# Patient Record
Sex: Female | Born: 1940
Health system: Southern US, Community
[De-identification: ages and names within clinical notes are randomized; demographics above are authoritative.]

## PROBLEM LIST (undated history)

## (undated) DIAGNOSIS — M810 Age-related osteoporosis without current pathological fracture: Secondary | ICD-10-CM

## (undated) DIAGNOSIS — I10 Essential (primary) hypertension: Secondary | ICD-10-CM

## (undated) DIAGNOSIS — C801 Malignant (primary) neoplasm, unspecified: Secondary | ICD-10-CM

## (undated) DIAGNOSIS — F329 Major depressive disorder, single episode, unspecified: Secondary | ICD-10-CM

## (undated) DIAGNOSIS — M1990 Unspecified osteoarthritis, unspecified site: Secondary | ICD-10-CM

## (undated) DIAGNOSIS — Z8719 Personal history of other diseases of the digestive system: Secondary | ICD-10-CM

## (undated) DIAGNOSIS — D649 Anemia, unspecified: Secondary | ICD-10-CM

## (undated) DIAGNOSIS — K219 Gastro-esophageal reflux disease without esophagitis: Secondary | ICD-10-CM

## (undated) DIAGNOSIS — R091 Pleurisy: Secondary | ICD-10-CM

## (undated) DIAGNOSIS — Z87440 Personal history of urinary (tract) infections: Secondary | ICD-10-CM

## (undated) DIAGNOSIS — T7840XA Allergy, unspecified, initial encounter: Secondary | ICD-10-CM

## (undated) DIAGNOSIS — F419 Anxiety disorder, unspecified: Secondary | ICD-10-CM

## (undated) DIAGNOSIS — I639 Cerebral infarction, unspecified: Secondary | ICD-10-CM

## (undated) DIAGNOSIS — E7800 Pure hypercholesterolemia, unspecified: Secondary | ICD-10-CM

## (undated) DIAGNOSIS — R51 Headache: Secondary | ICD-10-CM

## (undated) DIAGNOSIS — K5900 Constipation, unspecified: Secondary | ICD-10-CM

## (undated) DIAGNOSIS — F32A Depression, unspecified: Secondary | ICD-10-CM

## (undated) HISTORY — DX: Cerebral infarction, unspecified: I63.9

## (undated) HISTORY — DX: Pure hypercholesterolemia, unspecified: E78.00

## (undated) HISTORY — DX: Anxiety disorder, unspecified: F41.9

## (undated) HISTORY — PX: BACK SURGERY: SHX140

## (undated) HISTORY — PX: EYE SURGERY: SHX253

## (undated) HISTORY — PX: COLONOSCOPY: SHX174

## (undated) HISTORY — PX: OTHER SURGICAL HISTORY: SHX169

## (undated) HISTORY — PX: APPENDECTOMY: SHX54

## (undated) HISTORY — PX: DILATION AND CURETTAGE OF UTERUS: SHX78

## (undated) HISTORY — PX: ABDOMINAL HYSTERECTOMY: SHX81

## (undated) HISTORY — PX: CHOLECYSTECTOMY: SHX55

## (undated) HISTORY — PX: FOOT SURGERY: SHX648

## (undated) HISTORY — DX: Allergy, unspecified, initial encounter: T78.40XA

---

## 2005-07-04 ENCOUNTER — Ambulatory Visit: Payer: Self-pay | Admitting: Ophthalmology

## 2008-03-23 ENCOUNTER — Ambulatory Visit: Payer: Self-pay | Admitting: Gastroenterology

## 2009-11-21 ENCOUNTER — Ambulatory Visit: Payer: Self-pay

## 2009-11-29 ENCOUNTER — Ambulatory Visit: Payer: Self-pay | Admitting: Family Medicine

## 2009-12-08 ENCOUNTER — Ambulatory Visit: Payer: Self-pay | Admitting: Unknown Physician Specialty

## 2009-12-09 ENCOUNTER — Ambulatory Visit: Payer: Self-pay | Admitting: Unknown Physician Specialty

## 2010-01-25 ENCOUNTER — Ambulatory Visit: Payer: Self-pay | Admitting: Unknown Physician Specialty

## 2010-02-09 ENCOUNTER — Ambulatory Visit: Payer: Self-pay | Admitting: Unknown Physician Specialty

## 2010-07-13 ENCOUNTER — Ambulatory Visit: Payer: Self-pay | Admitting: Anesthesiology

## 2010-08-02 ENCOUNTER — Ambulatory Visit: Payer: Self-pay | Admitting: Anesthesiology

## 2010-08-29 ENCOUNTER — Ambulatory Visit: Payer: Self-pay | Admitting: Anesthesiology

## 2010-09-28 ENCOUNTER — Ambulatory Visit: Payer: Self-pay | Admitting: Anesthesiology

## 2010-10-20 ENCOUNTER — Ambulatory Visit: Payer: Self-pay | Admitting: Anesthesiology

## 2010-11-13 ENCOUNTER — Ambulatory Visit: Payer: Self-pay | Admitting: Neurosurgery

## 2011-03-02 ENCOUNTER — Ambulatory Visit: Payer: Self-pay | Admitting: Internal Medicine

## 2011-03-08 ENCOUNTER — Ambulatory Visit: Payer: Self-pay | Admitting: Internal Medicine

## 2011-05-22 ENCOUNTER — Other Ambulatory Visit: Payer: Self-pay | Admitting: Gastroenterology

## 2011-05-24 ENCOUNTER — Ambulatory Visit: Payer: Self-pay | Admitting: Gastroenterology

## 2011-06-13 ENCOUNTER — Ambulatory Visit: Payer: Self-pay | Admitting: Obstetrics and Gynecology

## 2011-06-21 ENCOUNTER — Ambulatory Visit: Payer: Self-pay | Admitting: Gastroenterology

## 2011-07-10 ENCOUNTER — Ambulatory Visit: Payer: Self-pay | Admitting: Obstetrics and Gynecology

## 2011-07-10 LAB — BASIC METABOLIC PANEL
BUN: 5 mg/dL — ABNORMAL LOW (ref 7–18)
Calcium, Total: 9.2 mg/dL (ref 8.5–10.1)
Chloride: 101 mmol/L (ref 98–107)
Co2: 27 mmol/L (ref 21–32)
Creatinine: 0.56 mg/dL — ABNORMAL LOW (ref 0.60–1.30)
EGFR (African American): >60
EGFR (Non-African Amer.): >60
Osmolality: 273 (ref 275–301)
Potassium: 4.6 mmol/L (ref 3.5–5.1)
Sodium: 138 mmol/L (ref 136–145)

## 2011-07-10 LAB — CBC
HCT: 38.1 % (ref 35.0–47.0)
HGB: 12.7 g/dL (ref 12.0–16.0)
MCH: 33 pg (ref 26.0–34.0)
MCHC: 33.2 g/dL (ref 32.0–36.0)
RBC: 3.83 10*6/uL (ref 3.80–5.20)

## 2011-07-16 ENCOUNTER — Ambulatory Visit: Payer: Self-pay | Admitting: Obstetrics and Gynecology

## 2011-07-18 LAB — PATHOLOGY REPORT

## 2012-03-05 ENCOUNTER — Ambulatory Visit: Payer: Self-pay | Admitting: Internal Medicine

## 2012-03-05 LAB — HM MAMMOGRAPHY: HM MAMMO: NORMAL

## 2013-01-31 ENCOUNTER — Inpatient Hospital Stay: Payer: Self-pay | Admitting: Internal Medicine

## 2013-01-31 LAB — COMPREHENSIVE METABOLIC PANEL
Albumin: 2.4 g/dL — ABNORMAL LOW (ref 3.4–5.0)
Alkaline Phosphatase: 174 U/L — ABNORMAL HIGH (ref 50–136)
Anion Gap: 13 (ref 7–16)
BUN: 8 mg/dL (ref 7–18)
Bilirubin,Total: 0.9 mg/dL (ref 0.2–1.0)
Calcium, Total: 8.8 mg/dL (ref 8.5–10.1)
Chloride: 91 mmol/L — ABNORMAL LOW (ref 98–107)
Co2: 23 mmol/L (ref 21–32)
EGFR (African American): >60
EGFR (Non-African Amer.): >60
Osmolality: 254 (ref 275–301)
Potassium: 3.9 mmol/L (ref 3.5–5.1)
SGOT(AST): 60 U/L — ABNORMAL HIGH (ref 15–37)
SGPT (ALT): 29 U/L (ref 12–78)
Sodium: 127 mmol/L — ABNORMAL LOW (ref 136–145)
Total Protein: 6 g/dL — ABNORMAL LOW (ref 6.4–8.2)

## 2013-01-31 LAB — URINALYSIS, COMPLETE
Bacteria: NONE SEEN
Bilirubin,UR: NEGATIVE
Blood: NEGATIVE
Glucose,UR: NEGATIVE mg/dL (ref 0–75)
Leukocyte Esterase: NEGATIVE
Nitrite: NEGATIVE
Ph: 6 (ref 4.5–8.0)
Squamous Epithelial: <1
WBC UR: 1 /HPF (ref 0–5)

## 2013-01-31 LAB — CBC
HGB: 14.1 g/dL (ref 12.0–16.0)
RBC: 3.75 10*6/uL — ABNORMAL LOW (ref 3.80–5.20)
RDW: 12.2 % (ref 11.5–14.5)
WBC: 8.6 10*3/uL (ref 3.6–11.0)

## 2013-01-31 LAB — CK TOTAL AND CKMB (NOT AT ARMC)
CK, Total: 159 U/L (ref 21–215)
CK-MB: 4.3 ng/mL — ABNORMAL HIGH (ref 0.5–3.6)

## 2013-02-01 ENCOUNTER — Ambulatory Visit: Payer: Self-pay | Admitting: Neurology

## 2013-02-01 DIAGNOSIS — I359 Nonrheumatic aortic valve disorder, unspecified: Secondary | ICD-10-CM

## 2013-02-01 LAB — COMPREHENSIVE METABOLIC PANEL
Albumin: 1.9 g/dL — ABNORMAL LOW (ref 3.4–5.0)
Alkaline Phosphatase: 141 U/L — ABNORMAL HIGH (ref 50–136)
Bilirubin,Total: 0.5 mg/dL (ref 0.2–1.0)
Calcium, Total: 7.9 mg/dL — ABNORMAL LOW (ref 8.5–10.1)
Chloride: 96 mmol/L — ABNORMAL LOW (ref 98–107)
Co2: 25 mmol/L (ref 21–32)
Creatinine: 0.61 mg/dL (ref 0.60–1.30)
EGFR (African American): >60
EGFR (Non-African Amer.): >60
Glucose: 87 mg/dL (ref 65–99)
Osmolality: 258 (ref 275–301)
SGOT(AST): 38 U/L — ABNORMAL HIGH (ref 15–37)
SGPT (ALT): 21 U/L (ref 12–78)
Total Protein: 4.8 g/dL — ABNORMAL LOW (ref 6.4–8.2)

## 2013-02-01 LAB — LIPID PANEL
Ldl Cholesterol, Calc: 87 mg/dL (ref 0–100)
Triglycerides: 70 mg/dL (ref 0–200)
VLDL Cholesterol, Calc: 14 mg/dL (ref 5–40)

## 2013-02-02 LAB — BASIC METABOLIC PANEL
Anion Gap: 7 (ref 7–16)
BUN: 3 mg/dL — ABNORMAL LOW (ref 7–18)
Calcium, Total: 7.8 mg/dL — ABNORMAL LOW (ref 8.5–10.1)
Chloride: 101 mmol/L (ref 98–107)
Co2: 25 mmol/L (ref 21–32)
EGFR (African American): >60
Osmolality: 263 (ref 275–301)

## 2013-02-03 ENCOUNTER — Encounter: Payer: Self-pay | Admitting: Internal Medicine

## 2013-02-03 LAB — URINALYSIS, COMPLETE
Bilirubin,UR: NEGATIVE
Glucose,UR: NEGATIVE mg/dL (ref 0–75)
Nitrite: POSITIVE
Protein: 100
Specific Gravity: 1.019 (ref 1.003–1.030)
Squamous Epithelial: 8
Transitional Epi: 1
WBC UR: 520 /HPF (ref 0–5)

## 2013-02-03 LAB — MAGNESIUM: Magnesium: 1.6 mg/dL — ABNORMAL LOW

## 2013-02-03 LAB — SODIUM: Sodium: 133 mmol/L — ABNORMAL LOW (ref 136–145)

## 2013-02-12 LAB — BASIC METABOLIC PANEL WITH GFR
Anion Gap: 7
BUN: 10 mg/dL
Calcium, Total: 8.9 mg/dL
Chloride: 108 mmol/L — ABNORMAL HIGH
Co2: 24 mmol/L
Creatinine: 0.37 mg/dL — ABNORMAL LOW
EGFR (African American): >60
EGFR (Non-African Amer.): >60
Glucose: 89 mg/dL
Osmolality: 276
Potassium: 3.8 mmol/L
Sodium: 139 mmol/L

## 2013-02-20 LAB — URINALYSIS, COMPLETE
Bilirubin,UR: NEGATIVE
Blood: NEGATIVE
Ketone: NEGATIVE
Leukocyte Esterase: NEGATIVE
Nitrite: NEGATIVE
Protein: NEGATIVE
WBC UR: 1 /HPF (ref 0–5)

## 2013-02-21 LAB — CLOSTRIDIUM DIFFICILE BY PCR

## 2013-02-22 LAB — URINE CULTURE

## 2013-02-23 LAB — BASIC METABOLIC PANEL
Anion Gap: 7 (ref 7–16)
BUN: 9 mg/dL (ref 7–18)
Calcium, Total: 8.9 mg/dL (ref 8.5–10.1)
Chloride: 112 mmol/L — ABNORMAL HIGH (ref 98–107)
Co2: 25 mmol/L (ref 21–32)
EGFR (African American): >60
Glucose: 90 mg/dL (ref 65–99)
Osmolality: 285 (ref 275–301)

## 2013-06-26 ENCOUNTER — Ambulatory Visit: Payer: Self-pay | Admitting: Family Medicine

## 2013-06-26 ENCOUNTER — Telehealth: Payer: Self-pay | Admitting: *Deleted

## 2013-06-26 NOTE — Telephone Encounter (Signed)
DR LAMB CALLED FROM [facility] REGARDING PT. SAID SHE HAS CELLULITIS/OSTEOMYLITIS AND SHE HAS A SCREW IN HER GREAT RIGHT TOE. WANTED TO SPEAK TO THE DOCTOR ON CALL BECAUSE SHE DID NOT KNOW WHAT TO DO WITH PT. I ADVISED HER TO SEND THE PT TO ER.  SAID SHE DID CALL THE ORTHOPEDICS ON CALL AND THEY SAID THEY DO NOT DEAL WITH FEET AND TO CALL HER PODIATRIST. SHE IS STILL WANTING TO TALK TO THE DOCTOR ON CALL. CAN YOU PLEASE CALL HER? [phone]. I AM FAXING CHART NOTES ON PT.

## 2013-06-29 ENCOUNTER — Encounter: Payer: Self-pay | Admitting: Podiatry

## 2013-06-29 ENCOUNTER — Ambulatory Visit (INDEPENDENT_AMBULATORY_CARE_PROVIDER_SITE_OTHER): Payer: Medicare HMO | Admitting: Podiatry

## 2013-06-29 ENCOUNTER — Ambulatory Visit (INDEPENDENT_AMBULATORY_CARE_PROVIDER_SITE_OTHER): Payer: Medicare HMO

## 2013-06-29 VITALS — BP 118/71 | HR 76 | Resp 16 | Ht 63.0 in | Wt 138.0 lb

## 2013-06-29 DIAGNOSIS — M79609 Pain in unspecified limb: Secondary | ICD-10-CM

## 2013-06-29 DIAGNOSIS — M79676 Pain in unspecified toe(s): Secondary | ICD-10-CM

## 2013-06-29 DIAGNOSIS — L03039 Cellulitis of unspecified toe: Secondary | ICD-10-CM

## 2013-06-29 DIAGNOSIS — L02619 Cutaneous abscess of unspecified foot: Secondary | ICD-10-CM

## 2013-06-29 MED ORDER — MUPIROCIN 2 % EX OINT: TOPICAL_OINTMENT | CUTANEOUS | Status: DC

## 2013-06-29 NOTE — Progress Notes (Signed)
Dr Elsworth Soho states pt has cellulitis of Right 1st toe, screw placement by Dr Milinda Pointer.  [place] pt no records in our office or in [place]. I spoke with Shelly Rubenstein and advised their office to send records to Dr. Elsworth Soho, and Dr. Blenda Mounts advised sent pt to the hospital for IV antibiotics.  Records faxed to our office for Dr. Blenda Mounts to advise Dr. Elsworth Soho.  Dr. Blenda Mounts spoke with Dr. Elsworth Soho.  Pt had already been scheduled to see Dr. Milinda Pointer in [place] on 06/29/2013.

## 2013-06-29 NOTE — Progress Notes (Signed)
   Subjective:    Patient ID: Valerie Hanna, female    DOB: 10/16/1940, 73 y.o.   MRN: [id number]  HPI Comments: Right great toe red swollen infected, it started 3 weeks ago. Went to urgent care on Friday she lanced it on Friday ,but stuff still coming out of it, put on antibiotics. Soaking in epsom salt soaks   Toe Pain       Review of Systems  HENT:       Sinus problems   Gastrointestinal: Positive for diarrhea.  Neurological: Positive for headaches.  Psychiatric/Behavioral: The patient is nervous/anxious.        Objective:   Physical Exam I have reviewed her past medical history medications allergies and surgeries. Most recently she has stroke August of 2014. Left-sided weakness. Pulses are strongly palpable right lower extremity. The right hallux demonstrates moderate to severe edema with cellulitis superficial ulceration to the dorsal aspect demonstrates only a small abscess to the dorsal lateral aspect of the toe. Currently it appears the cellulitis is subsiding. She's currently taking Bactrim and Levaquin. Radiographic evaluation does demonstrate retention of a single screw to the hallux right with a very nice IPJ fusion. I see no signs of osseous breakdown is no signs of osteomyelitis.        Assessment & Plan:  Assessment cellulitis with abscess hallux right.  Plan: Epsom salts and water soaks twice daily loose dressing and the use of her Darco shoes she has at home. She will continue her antibiotics I will followup with her in one to 2 weeks. Watch for worsening signs of infection i.e. Fever chills nausea vomiting streaking redness of the middle of the foot. If this does not resolve in the next couple of weeks we will need to take her to surgery.

## 2013-06-30 LAB — WOUND CULTURE

## 2013-07-06 ENCOUNTER — Ambulatory Visit: Payer: Self-pay | Admitting: Podiatry

## 2013-07-06 ENCOUNTER — Ambulatory Visit: Payer: Medicare HMO | Admitting: Podiatry

## 2013-07-20 ENCOUNTER — Ambulatory Visit (INDEPENDENT_AMBULATORY_CARE_PROVIDER_SITE_OTHER): Payer: Medicare HMO | Admitting: Podiatry

## 2013-07-20 ENCOUNTER — Encounter: Payer: Self-pay | Admitting: Podiatry

## 2013-07-20 VITALS — BP 140/83 | HR 93 | Resp 16 | Ht 63.0 in | Wt 138.0 lb

## 2013-07-20 DIAGNOSIS — M79609 Pain in unspecified limb: Secondary | ICD-10-CM

## 2013-07-20 DIAGNOSIS — L03039 Cellulitis of unspecified toe: Principal | ICD-10-CM

## 2013-07-20 DIAGNOSIS — B351 Tinea unguium: Secondary | ICD-10-CM

## 2013-07-20 DIAGNOSIS — L02619 Cutaneous abscess of unspecified foot: Secondary | ICD-10-CM

## 2013-07-20 NOTE — Progress Notes (Signed)
She presents today for followup of her abscess hallux right. She states it seems to be doing better but I have numbness and tingling in my toes. She states that it does not keep her awake at night and that the numbness and tingling is minimal. She continues to soak her foot daily that she has not soaked it over the past day or so. She's also complaining about elongated toenails.  Objective: Vital signs are stable she is alert and oriented x3. Her nails are thick yellow dystrophic lytic mycotic and severely elongated. Her abscess to the hallux right appears to be healing quite nicely there is minimal erythema no edema saline is drainage or odor associated with this.  Assessment: Well-healing abscess hallux right. Pain in limb secondary to onychomycosis 1 through 5 bilateral.  Plan: Debridement of nails 1 through 5 bilateral. Continue soaking the abscess followup with her in one month

## 2013-08-10 ENCOUNTER — Ambulatory Visit: Payer: Medicare HMO | Admitting: Podiatry

## 2013-08-12 ENCOUNTER — Ambulatory Visit (INDEPENDENT_AMBULATORY_CARE_PROVIDER_SITE_OTHER): Payer: Medicare HMO

## 2013-08-12 ENCOUNTER — Ambulatory Visit (INDEPENDENT_AMBULATORY_CARE_PROVIDER_SITE_OTHER): Payer: Medicare HMO | Admitting: Podiatry

## 2013-08-12 VITALS — BP 136/73 | HR 98 | Resp 16 | Ht 63.0 in | Wt 138.0 lb

## 2013-08-12 DIAGNOSIS — T8489XA Other specified complication of internal orthopedic prosthetic devices, implants and grafts, initial encounter: Secondary | ICD-10-CM

## 2013-08-12 DIAGNOSIS — M79609 Pain in unspecified limb: Secondary | ICD-10-CM

## 2013-08-12 DIAGNOSIS — M79673 Pain in unspecified foot: Secondary | ICD-10-CM

## 2013-08-12 DIAGNOSIS — T8484XA Pain due to internal orthopedic prosthetic devices, implants and grafts, initial encounter: Secondary | ICD-10-CM

## 2013-08-12 MED ORDER — PROMETHAZINE HCL 12.5 MG PO TABS: ORAL_TABLET | ORAL | Status: DC

## 2013-08-12 MED ORDER — OXYCODONE-ACETAMINOPHEN 10-325 MG PO TABS: ORAL_TABLET | ORAL | Status: DC

## 2013-08-12 MED ORDER — CEPHALEXIN 500 MG PO CAPS: 1000.0000 mg | ORAL_CAPSULE | Freq: Three times a day (TID) | ORAL | Status: DC

## 2013-08-12 NOTE — Progress Notes (Signed)
She presents today complaining of a painful hallux right foot as well as the left foot. She has taken her antibiotics and the hallux is still quite painful.  Objective: Vital signs are stable she is alert and oriented x3. Pulses are strongly palpable bilateral. Pain on range of motion of the IP joint of the hallux left indicative of osteoarthritis confirmed by radiographs. Painful internal fixation with superficial ulceration and cellulitis secondary to a screw that has ulcerated to the distal aspect of the skin of the toe. Superficial cellulitis extending from the proximal nail border overlying the IPJ.  Assessment: Osteoarthritis left great toe. Cellulitis with painful internal fixation and ulceration hallux right.  Plan: Discussed etiology pathology conservative versus surgical therapies started her on 1000 mg of cephalexin 3 times a day. And we went over consent form today line bylined number by number giving her ample time to ask questions she saw fit regarding a screw removal hallux right. Answered all the questions regarding this procedure the best of my ability in layman's terms she understood it was amenable to it signed all 3 pages of the consent form.

## 2013-08-13 ENCOUNTER — Telehealth: Payer: Self-pay | Admitting: *Deleted

## 2013-08-13 ENCOUNTER — Other Ambulatory Visit: Payer: Self-pay | Admitting: Podiatry

## 2013-08-13 NOTE — Telephone Encounter (Signed)
Pt called wanting to know info on her insurance to see if she is approved for surgery on 3.13.15  and wanting to know what to do with the items that was given to her in a bag. i called pt and received no answer. Ins has not been approved at time and she is to use ice pack after surgery and use the scrub brush to wash her foot night before surgery.

## 2013-08-14 ENCOUNTER — Encounter: Payer: Self-pay | Admitting: Podiatry

## 2013-08-14 DIAGNOSIS — Z472 Encounter for removal of internal fixation device: Secondary | ICD-10-CM

## 2013-08-14 DIAGNOSIS — L03039 Cellulitis of unspecified toe: Secondary | ICD-10-CM

## 2013-08-14 DIAGNOSIS — L02619 Cutaneous abscess of unspecified foot: Secondary | ICD-10-CM

## 2013-08-18 NOTE — Progress Notes (Signed)
SCREW REMOVAL HALLUX RIGHT

## 2013-08-21 ENCOUNTER — Ambulatory Visit (INDEPENDENT_AMBULATORY_CARE_PROVIDER_SITE_OTHER): Payer: Medicare HMO | Admitting: Podiatry

## 2013-08-21 ENCOUNTER — Ambulatory Visit (INDEPENDENT_AMBULATORY_CARE_PROVIDER_SITE_OTHER): Payer: Medicare HMO

## 2013-08-21 ENCOUNTER — Encounter: Payer: Self-pay | Admitting: Podiatry

## 2013-08-21 VITALS — BP 118/71 | HR 76 | Resp 16

## 2013-08-21 DIAGNOSIS — Z472 Encounter for removal of internal fixation device: Secondary | ICD-10-CM

## 2013-08-21 DIAGNOSIS — Z9889 Other specified postprocedural states: Secondary | ICD-10-CM

## 2013-08-21 NOTE — Progress Notes (Signed)
Subjective:     Patient ID: Valerie Hanna, female   DOB: 08-Nov-1940, 72 y.o.   MRN: [id number]  HPI patient states toe is fine but I have been sick at nighttime which I don't think is related anything that was done. One week after screw removal   Review of Systems     Objective:   Physical Exam Neurovascular status intact negative Homans sign noted with the incision site healing well right hallux with no erythema edema or drainage noted and stitches intact    Assessment:     Healing well post screw removal right    Plan:     Reviewed x-rays and reapplied sterile dressing and continue immobilization reappoint for stitch removal

## 2013-09-03 ENCOUNTER — Ambulatory Visit (INDEPENDENT_AMBULATORY_CARE_PROVIDER_SITE_OTHER): Payer: Medicare HMO | Admitting: Podiatry

## 2013-09-03 ENCOUNTER — Encounter: Payer: Self-pay | Admitting: Podiatry

## 2013-09-03 VITALS — BP 98/73 | HR 65 | Temp 98.8°F | Resp 20

## 2013-09-03 DIAGNOSIS — Z9889 Other specified postprocedural states: Secondary | ICD-10-CM

## 2013-09-03 MED ORDER — OXYCODONE-ACETAMINOPHEN 10-325 MG PO TABS: ORAL_TABLET | ORAL | Status: DC

## 2013-09-03 NOTE — Progress Notes (Signed)
She presents today status post removal screw hallux right foot. She states she was unable to take her antibiotics but failed to call us to let us know that. She denies fever chills nausea vomiting muscle aches and pains. However she is requesting more pain medicine.  Objective: Sutures are intact margins well coapted there is no erythema about the surgical site. I am still concerned of osteomyelitis.  Assessment: Apparently well-healing surgical foot hallux right.  Plan: Will allow her to start washing this as long she soaks it in Epsom salts warm water afterwards. She is also to continue to wear the Darco shoe. She will watch for signs and symptoms of worsening infection and notify me if there are any. I did write her another prescription for pain medicine.

## 2013-09-17 ENCOUNTER — Encounter: Payer: Medicare HMO | Admitting: Podiatry

## 2013-09-21 ENCOUNTER — Ambulatory Visit (INDEPENDENT_AMBULATORY_CARE_PROVIDER_SITE_OTHER): Payer: Medicare HMO

## 2013-09-21 ENCOUNTER — Encounter: Payer: Self-pay | Admitting: Podiatry

## 2013-09-21 ENCOUNTER — Ambulatory Visit (INDEPENDENT_AMBULATORY_CARE_PROVIDER_SITE_OTHER): Payer: Medicare HMO | Admitting: Podiatry

## 2013-09-21 VITALS — BP 124/74 | HR 77 | Resp 18

## 2013-09-21 DIAGNOSIS — Z9889 Other specified postprocedural states: Secondary | ICD-10-CM

## 2013-09-21 MED ORDER — OXYCODONE-ACETAMINOPHEN 10-325 MG PO TABS: ORAL_TABLET | ORAL | Status: DC

## 2013-09-21 NOTE — Progress Notes (Signed)
She presents today for followup of removal of internal fixation with possible osteomyelitis hallux right. She states that she's been soaking the toe and dressing it daily.  Objective: Vital signs are stable she is alert and oriented x2 there is minimal edema no erythema cellulitis drainage or odor visible eschar as fallen off no signs of infection radiographic evaluation demonstrates no obvious acute osteomyelitis.  Assessment: Well-healing surgical hallux.  Plan: Followup with me on an as-needed basis.

## 2013-12-24 ENCOUNTER — Ambulatory Visit: Payer: Self-pay | Admitting: Internal Medicine

## 2014-01-19 ENCOUNTER — Ambulatory Visit (INDEPENDENT_AMBULATORY_CARE_PROVIDER_SITE_OTHER): Payer: Commercial Managed Care - HMO

## 2014-01-19 ENCOUNTER — Other Ambulatory Visit: Payer: Self-pay | Admitting: Neurosurgery

## 2014-01-19 DIAGNOSIS — M4120 Other idiopathic scoliosis, site unspecified: Secondary | ICD-10-CM

## 2014-01-19 DIAGNOSIS — R079 Chest pain, unspecified: Secondary | ICD-10-CM

## 2014-01-19 DIAGNOSIS — S32040A Wedge compression fracture of fourth lumbar vertebra, initial encounter for closed fracture: Secondary | ICD-10-CM

## 2014-01-19 DIAGNOSIS — R109 Unspecified abdominal pain: Secondary | ICD-10-CM

## 2014-01-20 ENCOUNTER — Other Ambulatory Visit: Payer: Self-pay | Admitting: Neurosurgery

## 2014-01-21 ENCOUNTER — Encounter (HOSPITAL_COMMUNITY): Payer: Self-pay | Admitting: Pharmacy Technician

## 2014-01-25 ENCOUNTER — Encounter (HOSPITAL_COMMUNITY): Payer: Self-pay

## 2014-01-25 ENCOUNTER — Encounter (HOSPITAL_COMMUNITY)
Admission: RE | Admit: 2014-01-25 | Discharge: 2014-01-25 | Disposition: A | Discharge status: Home / Self Care | Payer: Medicare HMO | Source: Ambulatory Visit | Attending: Neurosurgery | Admitting: Neurosurgery

## 2014-01-25 ENCOUNTER — Other Ambulatory Visit (HOSPITAL_COMMUNITY): Payer: Self-pay | Admitting: *Deleted

## 2014-01-25 HISTORY — DX: Personal history of other diseases of the digestive system: Z87.19

## 2014-01-25 HISTORY — DX: Headache: R51

## 2014-01-25 HISTORY — DX: Major depressive disorder, single episode, unspecified: F32.9

## 2014-01-25 HISTORY — DX: Age-related osteoporosis without current pathological fracture: M81.0

## 2014-01-25 HISTORY — DX: Essential (primary) hypertension: I10

## 2014-01-25 HISTORY — DX: Anemia, unspecified: D64.9

## 2014-01-25 HISTORY — DX: Personal history of urinary (tract) infections: Z87.440

## 2014-01-25 HISTORY — DX: Pleurisy: R09.1

## 2014-01-25 HISTORY — DX: Gastro-esophageal reflux disease without esophagitis: K21.9

## 2014-01-25 HISTORY — DX: Depression, unspecified: F32.A

## 2014-01-25 HISTORY — DX: Constipation, unspecified: K59.00

## 2014-01-25 HISTORY — DX: Malignant (primary) neoplasm, unspecified: C80.1

## 2014-01-25 HISTORY — DX: Unspecified osteoarthritis, unspecified site: M19.90

## 2014-01-25 LAB — CBC
HCT: 36.8 % (ref 36.0–46.0)
Hemoglobin: 12.2 g/dL (ref 12.0–15.0)
MCH: 33.7 pg (ref 26.0–34.0)
MCHC: 33.2 g/dL (ref 30.0–36.0)
MCV: 101.7 fL — ABNORMAL HIGH (ref 78.0–100.0)
PLATELETS: 380 10*3/uL (ref 150–400)
RBC: 3.62 MIL/uL — ABNORMAL LOW (ref 3.87–5.11)
RDW: 12.3 % (ref 11.5–15.5)
WBC: 14.8 10*3/uL — AB (ref 4.0–10.5)

## 2014-01-25 LAB — BASIC METABOLIC PANEL
ANION GAP: 14 (ref 5–15)
BUN: 11 mg/dL (ref 6–23)
CO2: 24 meq/L (ref 19–32)
Calcium: 10 mg/dL (ref 8.4–10.5)
Chloride: 99 mEq/L (ref 96–112)
Creatinine, Ser: 0.41 mg/dL — ABNORMAL LOW (ref 0.50–1.10)
GFR calc Af Amer: >90 mL/min (ref >90)
GLUCOSE: 135 mg/dL — AB (ref 70–99)
POTASSIUM: 5.3 meq/L (ref 3.7–5.3)
SODIUM: 137 meq/L (ref 137–147)

## 2014-01-25 LAB — SURGICAL PCR SCREEN
MRSA, PCR: POSITIVE — AB
STAPHYLOCOCCUS AUREUS: POSITIVE — AB

## 2014-01-25 NOTE — Progress Notes (Signed)
Pt is being treated for pleurisy on the left with antibiotics and prednisone. Pt not sure what antibiotics she is on. Will bring Rx with her day of surgery. States she is feeling much better. Denies any SOB at this time. The main complaint was that it was painful to lift her left arm.

## 2014-01-25 NOTE — Progress Notes (Signed)
Pre-op orders not signed in EPIC, called and left message with Manuela Schwartz, scheduler for Dr. Christella Noa, requesting orders be signed.

## 2014-01-25 NOTE — Pre-Procedure Instructions (Signed)
JAMARA VARY  01/25/2014   Your procedure is scheduled on:  Thursday, January 28, 2014 at 2:00 PM.   Report to [facility] Entrance "A" Admitting Office at 11:00 AM.   Call this number if you have problems the morning of surgery: [phone]   Remember:   Do not eat food or drink liquids after midnight Wednesday, 01/27/14.   Take these medicines the morning of surgery with A SIP OF WATER: pantoprazole (PROTONIX), sertraline (ZOLOFT), HYDROcodone-acetaminophen (NORCO/VICODIN) - if needed.  Stop Vitamins and Plavix as of today.   Do not wear jewelry, make-up or nail polish.  Do not wear lotions, powders, or perfumes. You may wear deodorant.  Do not shave 48 hours prior to surgery.   Do not bring valuables to the hospital.  [facility] is not responsible                  for any belongings or valuables.               Contacts, dentures or bridgework may not be worn into surgery.  Leave suitcase in the car. After surgery it may be brought to your room.  For patients admitted to the hospital, discharge time is determined by your                treatment team.               Special Instructions: [facility] - Preparing for Surgery  Before surgery, you can play an important role.  Because skin is not sterile, your skin needs to be as free of germs as possible.  You can reduce the number of germs on you skin by washing with CHG (chlorahexidine gluconate) soap before surgery.  CHG is an antiseptic cleaner which kills germs and bonds with the skin to continue killing germs even after washing.  Please DO NOT use if you have an allergy to CHG or antibacterial soaps.  If your skin becomes reddened/irritated stop using the CHG and inform your nurse when you arrive at Short Stay.  Do not shave (including legs and underarms) for at least 48 hours prior to the first CHG shower.  You may shave your face.  Please follow these instructions carefully:   1.  Shower with CHG Soap the night  before surgery and the                                morning of Surgery.  2.  If you choose to wash your hair, wash your hair first as usual with your       normal shampoo.  3.  After you shampoo, rinse your hair and body thoroughly to remove the                      Shampoo.  4.  Use CHG as you would any other liquid soap.  You can apply chg directly       to the skin and wash gently with scrungie or a clean washcloth.  5.  Apply the CHG Soap to your body ONLY FROM THE NECK DOWN.        Do not use on open wounds or open sores.  Avoid contact with your eyes, ears, mouth and genitals (private parts).  Wash genitals (private parts) with your normal soap.  6.  Wash thoroughly, paying special attention to the area where your  surgery        will be performed.  7.  Thoroughly rinse your body with warm water from the neck down.  8.  DO NOT shower/wash with your normal soap after using and rinsing off       the CHG Soap.  9.  Pat yourself dry with a clean towel.            10.  Wear clean pajamas.            11.  Place clean sheets on your bed the night of your first shower and do not        sleep with pets.  Day of Surgery  Do not apply any lotions the morning of surgery.  Please wear clean clothes to the hospital/surgery center.     Please read over the following fact sheets that you were given: Pain Booklet, Coughing and Deep Breathing, MRSA Information and Surgical Site Infection Prevention
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
## 2014-01-26 ENCOUNTER — Encounter (HOSPITAL_COMMUNITY): Payer: Self-pay

## 2014-01-26 NOTE — Progress Notes (Signed)
Mupirocin ointment Rx called into [facility] in [place] for positive PCR of MRSA and Staph. Pt notified and voiced understanding.

## 2014-01-26 NOTE — Progress Notes (Signed)
Anesthesia chart review: Patient is a 73 year old female presents for L4 kyphoplasty on 01/2714 by Dr. Christella Noa.  History includes smoking, HTN (no longer requiring medication), CVA with left hemiparesis, hiatal hernia, GERD, hypercholesterolemia, anxiety, depression, arthritis, anemia, osteoporosis, melanoma, headaches. Plavix was held starting 01/19/14. She was recently treated for pleurisy with antibiotics and prednisone.  She is now off antibiotics, and her prednisone taper is scheduled to be completed on 01/27/14. I was not asked to evaluate her at PAT, but sats were 98%, RR 18, T 36.7 C, BP 132/70, and according to PAT RN notes patient denied SOB and reported feeling much better.  PCP is listed as Dr. Halina Maidens.  EKG on 01/31/13 [facility]) showed: ST at 107 bpm, non-specific ST abnormality in inferior leads.  No CXR was done at PAT.  Preoperative labs noted.  WBC 14.8K. Glucose 135.  Leukocytosis may be related to prednisone taper or reactive from her recent treatment for pleurisy.  She is now off antibiotics for pleurisy and reported feeling better.  Since patient is not currently here, she will have to be further evaluated by her assigned anesthesiologist and surgeon on the day of surgery to determine if CXR and/or repeat CBC are warranted.  If lung sounds are unremarkable, no acute respiratory symptoms and remains afebrile then I would anticipate that she could proceed.     George Hugh [facility] Short Stay Center/Anesthesiology Phone [phone] 01/26/2014 9:39 AM

## 2014-01-27 MED ORDER — CEFAZOLIN SODIUM-DEXTROSE 2-3 GM-% IV SOLR
2.0000 g | INTRAVENOUS | Status: DC
  Filled 2014-01-27: qty 50

## 2014-01-28 ENCOUNTER — Encounter (HOSPITAL_COMMUNITY)
Admission: RE | Disposition: A | Discharge status: Home / Self Care | Payer: Self-pay | Source: Ambulatory Visit | Attending: Neurosurgery

## 2014-01-28 ENCOUNTER — Encounter (HOSPITAL_COMMUNITY): Payer: Self-pay | Admitting: *Deleted

## 2014-01-28 ENCOUNTER — Inpatient Hospital Stay (HOSPITAL_COMMUNITY): Payer: Medicare HMO | Admitting: Anesthesiology

## 2014-01-28 ENCOUNTER — Inpatient Hospital Stay (HOSPITAL_COMMUNITY): Payer: Medicare HMO

## 2014-01-28 ENCOUNTER — Encounter (HOSPITAL_COMMUNITY): Payer: Medicare HMO | Admitting: Vascular Surgery

## 2014-01-28 ENCOUNTER — Inpatient Hospital Stay (HOSPITAL_COMMUNITY)
Admission: RE | Admit: 2014-01-28 | Discharge: 2014-01-31 | DRG: 516 | Disposition: A | Discharge status: Home / Self Care | Payer: Medicare HMO | Source: Ambulatory Visit | Attending: Neurosurgery | Admitting: Neurosurgery

## 2014-01-28 DIAGNOSIS — R61 Generalized hyperhidrosis: Secondary | ICD-10-CM | POA: Diagnosis present

## 2014-01-28 DIAGNOSIS — F3289 Other specified depressive episodes: Secondary | ICD-10-CM | POA: Diagnosis present

## 2014-01-28 DIAGNOSIS — R64 Cachexia: Secondary | ICD-10-CM | POA: Diagnosis not present

## 2014-01-28 DIAGNOSIS — F329 Major depressive disorder, single episode, unspecified: Secondary | ICD-10-CM | POA: Diagnosis present

## 2014-01-28 DIAGNOSIS — X58XXXA Exposure to other specified factors, initial encounter: Secondary | ICD-10-CM | POA: Diagnosis present

## 2014-01-28 DIAGNOSIS — F17200 Nicotine dependence, unspecified, uncomplicated: Secondary | ICD-10-CM | POA: Diagnosis not present

## 2014-01-28 DIAGNOSIS — IMO0002 Reserved for concepts with insufficient information to code with codable children: Secondary | ICD-10-CM

## 2014-01-28 DIAGNOSIS — S22000A Wedge compression fracture of unspecified thoracic vertebra, initial encounter for closed fracture: Secondary | ICD-10-CM | POA: Diagnosis present

## 2014-01-28 DIAGNOSIS — R071 Chest pain on breathing: Secondary | ICD-10-CM | POA: Diagnosis present

## 2014-01-28 DIAGNOSIS — S32009A Unspecified fracture of unspecified lumbar vertebra, initial encounter for closed fracture: Secondary | ICD-10-CM | POA: Diagnosis not present

## 2014-01-28 HISTORY — PX: KYPHOPLASTY: SHX5884

## 2014-01-28 SURGERY — KYPHOPLASTY
Anesthesia: General | Site: Back

## 2014-01-28 MED ORDER — LACTATED RINGERS IV SOLN
INTRAVENOUS | Status: DC
  Administered 2014-01-28: 13:00:00 via INTRAVENOUS

## 2014-01-28 MED ORDER — PROCHLORPERAZINE 25 MG RE SUPP
25.0000 mg | Freq: Two times a day (BID) | RECTAL | Status: DC | PRN
  Filled 2014-01-28: qty 1

## 2014-01-28 MED ORDER — VANCOMYCIN HCL IN DEXTROSE 1-5 GM/200ML-% IV SOLN
INTRAVENOUS | Status: AC
  Administered 2014-01-28: 1000 mg via INTRAVENOUS
  Filled 2014-01-28: qty 200

## 2014-01-28 MED ORDER — IPRATROPIUM BROMIDE 0.06 % NA SOLN
2.0000 | Freq: Two times a day (BID) | NASAL | Status: DC
  Administered 2014-01-28 – 2014-01-31 (×4): 2 via NASAL
  Filled 2014-01-28: qty 15

## 2014-01-28 MED ORDER — HYDROCODONE-ACETAMINOPHEN 5-325 MG PO TABS: 1.0000 | ORAL_TABLET | ORAL | Status: DC | PRN

## 2014-01-28 MED ORDER — PROPOFOL 10 MG/ML IV BOLUS
INTRAVENOUS | Status: DC | PRN
  Administered 2014-01-28: 150 mg via INTRAVENOUS
  Administered 2014-01-28: 40 mg via INTRAVENOUS
  Administered 2014-01-28: 10 mg via INTRAVENOUS

## 2014-01-28 MED ORDER — LIDOCAINE HCL (CARDIAC) 20 MG/ML IV SOLN
INTRAVENOUS | Status: AC
  Filled 2014-01-28: qty 5

## 2014-01-28 MED ORDER — LIDOCAINE-EPINEPHRINE 0.5 %-1:200000 IJ SOLN
INTRAMUSCULAR | Status: DC | PRN
  Administered 2014-01-28: 5 mL

## 2014-01-28 MED ORDER — EPHEDRINE SULFATE 50 MG/ML IJ SOLN
INTRAMUSCULAR | Status: AC
Start: 2014-01-28 — End: 2014-01-28
  Filled 2014-01-28: qty 1

## 2014-01-28 MED ORDER — PANTOPRAZOLE SODIUM 40 MG PO TBEC
40.0000 mg | DELAYED_RELEASE_TABLET | Freq: Two times a day (BID) | ORAL | Status: DC
  Administered 2014-01-28 – 2014-01-31 (×6): 40 mg via ORAL
  Filled 2014-01-28 (×6): qty 1

## 2014-01-28 MED ORDER — LACTATED RINGERS IV SOLN
INTRAVENOUS | Status: DC | PRN
  Administered 2014-01-28 (×2): via INTRAVENOUS

## 2014-01-28 MED ORDER — HYDROMORPHONE HCL PF 1 MG/ML IJ SOLN
INTRAMUSCULAR | Status: AC
  Administered 2014-01-28: 0.5 mg via INTRAVENOUS
  Filled 2014-01-28: qty 1

## 2014-01-28 MED ORDER — MORPHINE SULFATE 2 MG/ML IJ SOLN: 2.0000 mg | INTRAMUSCULAR | Status: DC | PRN

## 2014-01-28 MED ORDER — OXYCODONE-ACETAMINOPHEN 5-325 MG PO TABS
1.0000 | ORAL_TABLET | ORAL | Status: DC | PRN
  Administered 2014-01-28 – 2014-01-30 (×6): 2 via ORAL
  Filled 2014-01-28 (×6): qty 2

## 2014-01-28 MED ORDER — STERILE WATER FOR INJECTION IJ SOLN
INTRAMUSCULAR | Status: AC
  Filled 2014-01-28: qty 10

## 2014-01-28 MED ORDER — DEXAMETHASONE SODIUM PHOSPHATE 4 MG/ML IJ SOLN
INTRAMUSCULAR | Status: DC | PRN
  Administered 2014-01-28: 8 mg via INTRAVENOUS

## 2014-01-28 MED ORDER — OXYCODONE HCL 5 MG PO TABS
ORAL_TABLET | ORAL | Status: AC
  Filled 2014-01-28: qty 2

## 2014-01-28 MED ORDER — FENTANYL CITRATE 0.05 MG/ML IJ SOLN
INTRAMUSCULAR | Status: AC
  Filled 2014-01-28: qty 5

## 2014-01-28 MED ORDER — CHLORHEXIDINE GLUCONATE CLOTH 2 % EX PADS
6.0000 | MEDICATED_PAD | Freq: Every day | CUTANEOUS | Status: DC
  Administered 2014-01-29 – 2014-01-31 (×3): 6 via TOPICAL

## 2014-01-28 MED ORDER — FENTANYL CITRATE 0.05 MG/ML IJ SOLN
INTRAMUSCULAR | Status: DC | PRN
  Administered 2014-01-28: 150 ug via INTRAVENOUS
  Administered 2014-01-28 (×2): 50 ug via INTRAVENOUS

## 2014-01-28 MED ORDER — LIDOCAINE HCL (CARDIAC) 20 MG/ML IV SOLN
INTRAVENOUS | Status: DC | PRN
  Administered 2014-01-28: 50 mg via INTRAVENOUS

## 2014-01-28 MED ORDER — DEXAMETHASONE SODIUM PHOSPHATE 4 MG/ML IJ SOLN
INTRAMUSCULAR | Status: AC
  Filled 2014-01-28: qty 1

## 2014-01-28 MED ORDER — ONDANSETRON HCL 4 MG/2ML IJ SOLN
INTRAMUSCULAR | Status: DC | PRN
  Administered 2014-01-28: 4 mg via INTRAVENOUS

## 2014-01-28 MED ORDER — HYDROMORPHONE HCL PF 1 MG/ML IJ SOLN
0.2500 mg | INTRAMUSCULAR | Status: DC | PRN
  Administered 2014-01-28 (×2): 0.5 mg via INTRAVENOUS

## 2014-01-28 MED ORDER — SUCCINYLCHOLINE CHLORIDE 20 MG/ML IJ SOLN
INTRAMUSCULAR | Status: AC
  Filled 2014-01-28: qty 1

## 2014-01-28 MED ORDER — EPHEDRINE SULFATE 50 MG/ML IJ SOLN
INTRAMUSCULAR | Status: DC | PRN
  Administered 2014-01-28: 5 mg via INTRAVENOUS

## 2014-01-28 MED ORDER — ALPRAZOLAM 0.25 MG PO TABS
0.2500 mg | ORAL_TABLET | Freq: Every evening | ORAL | Status: DC | PRN
  Administered 2014-01-28: 0.25 mg via ORAL
  Filled 2014-01-28: qty 1

## 2014-01-28 MED ORDER — MUPIROCIN 2 % EX OINT
TOPICAL_OINTMENT | Freq: Two times a day (BID) | CUTANEOUS | Status: AC
  Administered 2014-01-28: 22:00:00 via NASAL

## 2014-01-28 MED ORDER — ONDANSETRON HCL 4 MG/2ML IJ SOLN
INTRAMUSCULAR | Status: AC
  Filled 2014-01-28: qty 2

## 2014-01-28 MED ORDER — KETOROLAC TROMETHAMINE 30 MG/ML IJ SOLN
30.0000 mg | Freq: Four times a day (QID) | INTRAMUSCULAR | Status: DC
Start: 2014-01-28 — End: 2014-01-30
  Administered 2014-01-28 – 2014-01-29 (×3): 30 mg via INTRAVENOUS
  Filled 2014-01-28 (×6): qty 1

## 2014-01-28 MED ORDER — SIMVASTATIN 40 MG PO TABS
40.0000 mg | ORAL_TABLET | Freq: Every day | ORAL | Status: DC
  Administered 2014-01-28 – 2014-01-30 (×3): 40 mg via ORAL
  Filled 2014-01-28 (×4): qty 1

## 2014-01-28 MED ORDER — OXYCODONE HCL 5 MG PO TABS
5.0000 mg | ORAL_TABLET | ORAL | Status: DC | PRN
  Administered 2014-01-28 – 2014-01-30 (×3): 10 mg via ORAL
  Administered 2014-01-31: 5 mg via ORAL
  Administered 2014-01-31: 10 mg via ORAL
  Filled 2014-01-28: qty 1
  Filled 2014-01-28 (×3): qty 2

## 2014-01-28 MED ORDER — IOHEXOL 300 MG/ML  SOLN
INTRAMUSCULAR | Status: DC | PRN
  Administered 2014-01-28: 50 mL via INTRAVENOUS

## 2014-01-28 MED ORDER — SERTRALINE HCL 50 MG PO TABS
50.0000 mg | ORAL_TABLET | Freq: Every day | ORAL | Status: DC
  Administered 2014-01-28 – 2014-01-30 (×3): 50 mg via ORAL
  Filled 2014-01-28 (×4): qty 1

## 2014-01-28 MED ORDER — ADULT MULTIVITAMIN W/MINERALS CH
1.0000 | ORAL_TABLET | Freq: Every day | ORAL | Status: DC
  Administered 2014-01-29 – 2014-01-31 (×3): 1 via ORAL
  Filled 2014-01-28 (×3): qty 1

## 2014-01-28 MED ORDER — SUCCINYLCHOLINE CHLORIDE 20 MG/ML IJ SOLN
INTRAMUSCULAR | Status: DC | PRN
  Administered 2014-01-28: 100 mg via INTRAVENOUS

## 2014-01-28 MED ORDER — MULTIVITAMINS PO CAPS: 1.0000 | ORAL_CAPSULE | Freq: Every day | ORAL | Status: DC

## 2014-01-28 MED ORDER — KETOROLAC TROMETHAMINE 30 MG/ML IJ SOLN
INTRAMUSCULAR | Status: AC
  Administered 2014-01-28: 30 mg via INTRAVENOUS
  Filled 2014-01-28: qty 1

## 2014-01-28 MED ORDER — POTASSIUM CHLORIDE IN NACL 20-0.9 MEQ/L-% IV SOLN
INTRAVENOUS | Status: DC
  Filled 2014-01-28 (×3): qty 1000

## 2014-01-28 SURGICAL SUPPLY — 42 items
BLADE SURG 15 STRL LF DISP TIS (BLADE) ×1 IMPLANT
BLADE SURG 15 STRL SS (BLADE) ×2
BLADE SURG ROTATE 9660 (MISCELLANEOUS) IMPLANT
CEMENT BONE KYPHX HV R (Orthopedic Implant) ×3 IMPLANT
CEMENT KYPHON C01A KIT/MIXER (Cement) ×3 IMPLANT
CONT SPEC 4OZ CLIKSEAL STRL BL (MISCELLANEOUS) ×3 IMPLANT
DERMABOND ADVANCED (GAUZE/BANDAGES/DRESSINGS) ×2
DERMABOND ADVANCED .7 DNX12 (GAUZE/BANDAGES/DRESSINGS) ×1 IMPLANT
DRAPE C-ARM 42X72 X-RAY (DRAPES) ×3 IMPLANT
DRAPE INCISE IOBAN 66X45 STRL (DRAPES) ×3 IMPLANT
DRAPE LAPAROTOMY 100X72X124 (DRAPES) ×3 IMPLANT
DRAPE PROXIMA HALF (DRAPES) ×3 IMPLANT
DRAPE SURG 17X23 STRL (DRAPES) ×3 IMPLANT
DRSG TELFA 3X8 NADH (GAUZE/BANDAGES/DRESSINGS) IMPLANT
DURAPREP 26ML APPLICATOR (WOUND CARE) ×3 IMPLANT
GAUZE SPONGE 4X4 16PLY XRAY LF (GAUZE/BANDAGES/DRESSINGS) ×3 IMPLANT
GLOVE BIOGEL PI IND STRL 8 (GLOVE) ×2 IMPLANT
GLOVE BIOGEL PI INDICATOR 8 (GLOVE) ×4
GLOVE ECLIPSE 6.5 STRL STRAW (GLOVE) ×3 IMPLANT
GLOVE ECLIPSE 7.5 STRL STRAW (GLOVE) ×6 IMPLANT
GLOVE EXAM NITRILE LRG STRL (GLOVE) IMPLANT
GLOVE EXAM NITRILE MD LF STRL (GLOVE) IMPLANT
GLOVE EXAM NITRILE XL STR (GLOVE) IMPLANT
GLOVE EXAM NITRILE XS STR PU (GLOVE) IMPLANT
GOWN STRL REUS W/ TWL LRG LVL3 (GOWN DISPOSABLE) ×1 IMPLANT
GOWN STRL REUS W/ TWL XL LVL3 (GOWN DISPOSABLE) IMPLANT
GOWN STRL REUS W/TWL 2XL LVL3 (GOWN DISPOSABLE) ×6 IMPLANT
GOWN STRL REUS W/TWL LRG LVL3 (GOWN DISPOSABLE) ×2
GOWN STRL REUS W/TWL XL LVL3 (GOWN DISPOSABLE)
KIT BASIN OR (CUSTOM PROCEDURE TRAY) ×3 IMPLANT
KIT ROOM TURNOVER OR (KITS) ×3 IMPLANT
NEEDLE HYPO 25X1 1.5 SAFETY (NEEDLE) ×3 IMPLANT
NS IRRIG 1000ML POUR BTL (IV SOLUTION) ×3 IMPLANT
PACK SURGICAL SETUP 50X90 (CUSTOM PROCEDURE TRAY) ×3 IMPLANT
PAD ARMBOARD 7.5X6 YLW CONV (MISCELLANEOUS) ×9 IMPLANT
SPECIMEN JAR SMALL (MISCELLANEOUS) IMPLANT
STAPLER SKIN PROX WIDE 3.9 (STAPLE) IMPLANT
SUT VIC AB 3-0 SH 8-18 (SUTURE) ×3 IMPLANT
SYR CONTROL 10ML LL (SYRINGE) ×3 IMPLANT
TOWEL OR 17X24 6PK STRL BLUE (TOWEL DISPOSABLE) ×3 IMPLANT
TOWEL OR 17X26 10 PK STRL BLUE (TOWEL DISPOSABLE) ×3 IMPLANT
TRAY KYPHOPAK 20/3 ONESTEP 1ST (MISCELLANEOUS) ×3 IMPLANT

## 2014-01-28 NOTE — Op Note (Signed)
01/28/2014  4:35 PM  PATIENT:  Valerie Hanna  73 y.o. female  PRE-OPERATIVE DIAGNOSIS:  lumbar fracture  POST-OPERATIVE DIAGNOSIS:  lumbar fracture  PROCEDURE:  Procedure(s): LUMBAR FOUR KYPHOPLASTY  SURGEON:  Surgeon(s): Ashok Pall, MD  ANESTHESIA:   general  EBL:  Total I/O In: 1200 [I.V.:1200] Out: -   BLOOD ADMINISTERED:none  COUNT:per nursing  SPECIMEN:  No Specimen  DICTATION: [name] was taken to the operating room, intubated and placed under a general anesthetic without difficulty. She was positioned prone on the operating room table with all pressure points properly padded. Her back was prepped and draped in a sterile manner. With fluoroscopy I localized the L4 pedicles bilaterally. I injected lidocaine into the entry site on both the  right side. I started by making a stab incision on the right side and entering the right L4 pedicle with fluoroscopic guidance. Once good position was obtained, I drilled into the vertebral body. I then placed the kyphoplasty balloon into the L4 vertebra and inflated the balloon. I then inserted 6cc of methylmethacrylate into the vertebral body under fluoroscopic guidance. I achieved a very good, bilateral fill of the cavity, staying within the confines of the vertebral body. I removed the instrumentation from the vertebral body, and the final films looked good. I closed the stab incision with vicryl suture and used Dermabond for a sterile dressing. Marland Kitchen    PLAN OF CARE: Admit to inpatient   PATIENT DISPOSITION:  PACU - hemodynamically stable.   Delay start of Pharmacological VTE agent (>24hrs) due to surgical blood loss or risk of bleeding:  yes
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
## 2014-01-28 NOTE — Transfer of Care (Signed)
Immediate Anesthesia Transfer of Care Note  Patient: Valerie Hanna  Procedure(s) Performed: Procedure(s) with comments: LUMBAR FOUR KYPHOPLASTY (N/A) - L4 Kyphoplasty  Patient Location: PACU  Anesthesia Type:General  Level of Consciousness: awake, alert , oriented, patient cooperative and responds to stimulation  Airway & Oxygen Therapy: Patient Spontanous Breathing and Patient connected to nasal cannula oxygen  Post-op Assessment: Report given to PACU RN, Post -op Vital signs reviewed and stable and Patient moving all extremities X 4  Post vital signs: Reviewed and stable  Complications: No apparent anesthesia complications

## 2014-01-28 NOTE — Anesthesia Postprocedure Evaluation (Signed)
  Anesthesia Post-op Note  Patient: Valerie Hanna  Procedure(s) Performed: Procedure(s) with comments: LUMBAR FOUR KYPHOPLASTY (N/A) - L4 Kyphoplasty  Patient Location: PACU  Anesthesia Type:General  Level of Consciousness: awake and alert   Airway and Oxygen Therapy: Patient Spontanous Breathing  Post-op Pain: moderate  Post-op Assessment: Post-op Vital signs reviewed, Patient's Cardiovascular Status Stable and Respiratory Function Stable  Post-op Vital Signs: Reviewed  Filed Vitals:   01/28/14 1700  BP: 104/52  Pulse: 93  Temp:   Resp: 21    Complications: No apparent anesthesia complications

## 2014-01-28 NOTE — Plan of Care (Signed)
Problem: Consults Goal: Diagnosis - Spinal Surgery Outcome: Completed/Met Date Met:  01/28/14 L4 KYPHOPLASTY

## 2014-01-28 NOTE — Anesthesia Preprocedure Evaluation (Signed)
Anesthesia Evaluation  Patient identified by MRN, date of birth, ID band Patient awake    Reviewed: Allergy & Precautions, H&P , NPO status , Patient's Chart, lab work & pertinent test results  Airway Mallampati: II      Dental   Pulmonary Current Smoker,  breath sounds clear to auscultation        Cardiovascular hypertension, Rhythm:Regular Rate:Normal     Neuro/Psych  Headaches,    GI/Hepatic Neg liver ROS, hiatal hernia, GERD-  ,  Endo/Other    Renal/GU negative Renal ROS     Musculoskeletal   Abdominal   Peds  Hematology   Anesthesia Other Findings   Reproductive/Obstetrics                           Anesthesia Physical Anesthesia Plan  ASA: III  Anesthesia Plan: General   Post-op Pain Management:    Induction: Intravenous  Airway Management Planned: Oral ETT  Additional Equipment:   Intra-op Plan:   Post-operative Plan: Extubation in OR  Informed Consent: I have reviewed the patients History and Physical, chart, labs and discussed the procedure including the risks, benefits and alternatives for the proposed anesthesia with the patient or authorized representative who has indicated his/her understanding and acceptance.   Dental advisory given  Plan Discussed with: CRNA and Anesthesiologist  Anesthesia Plan Comments:         Anesthesia Quick Evaluation

## 2014-01-29 ENCOUNTER — Encounter (HOSPITAL_COMMUNITY): Payer: Self-pay | Admitting: Neurosurgery

## 2014-01-29 MED ORDER — HYDROCODONE-ACETAMINOPHEN 5-325 MG PO TABS: 1.0000 | ORAL_TABLET | Freq: Four times a day (QID) | ORAL | Status: DC | PRN

## 2014-01-29 NOTE — Discharge Instructions (Signed)
.  kc °

## 2014-01-29 NOTE — Progress Notes (Signed)
Patient ID: Valerie Hanna, female   DOB: December 04, 1940, 73 y.o.   MRN: [id number] BP 123/66  Pulse 89  Temp(Src) 98.5 F (36.9 C) (Oral)  Resp 18  SpO2 93% Doing well  Anticipated discharge soon.  Moving all extremities
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
## 2014-01-30 MED ORDER — MAGNESIUM HYDROXIDE 400 MG/5ML PO SUSP
30.0000 mL | Freq: Every day | ORAL | Status: DC | PRN
Start: 2014-01-30 — End: 2014-01-31

## 2014-01-30 MED ORDER — ZOLPIDEM TARTRATE 5 MG PO TABS
5.0000 mg | ORAL_TABLET | Freq: Every evening | ORAL | Status: DC | PRN
  Administered 2014-01-30: 5 mg via ORAL
  Filled 2014-01-30: qty 1

## 2014-01-30 MED ORDER — FLEET ENEMA 7-19 GM/118ML RE ENEM: 1.0000 | ENEMA | Freq: Every day | RECTAL | Status: DC | PRN

## 2014-01-30 MED ORDER — POLYETHYLENE GLYCOL 3350 17 G PO PACK
17.0000 g | PACK | Freq: Every day | ORAL | Status: DC
  Administered 2014-01-30 – 2014-01-31 (×2): 17 g via ORAL
  Filled 2014-01-30 (×2): qty 1

## 2014-01-30 MED ORDER — KETOROLAC TROMETHAMINE 10 MG PO TABS
30.0000 mg | ORAL_TABLET | Freq: Four times a day (QID) | ORAL | Status: DC
  Administered 2014-01-30 – 2014-01-31 (×5): 30 mg via ORAL
  Filled 2014-01-30 (×8): qty 3

## 2014-01-30 MED ORDER — BISACODYL 10 MG RE SUPP: 10.0000 mg | Freq: Every day | RECTAL | Status: DC | PRN

## 2014-01-30 MED ORDER — BISACODYL 10 MG RE SUPP
10.0000 mg | Freq: Every day | RECTAL | Status: DC | PRN
  Administered 2014-01-30: 10 mg via RECTAL
  Filled 2014-01-30: qty 1

## 2014-01-30 NOTE — Evaluation (Signed)
Physical Therapy Evaluation Patient Details Name: Valerie Hanna MRN: [id number] DOB: [birth year] Today's Date: 01/30/2014   History of Present Illness  Pt underwent kyphoplasty 01-28-14 due to a compression fx at L4.  Clinical Impression  Patient is s/p kyphoplasty surgery resulting in functional limitations due to the deficits listed below (see PT Problem List). Pt is having difficulty with pain management resulting in slow progression with mobility.  She is complaining of L flank pain. Patient will benefit from skilled PT to increase their independence and safety with mobility to allow discharge to the venue listed below.       Follow Up Recommendations Home health PT    Equipment Recommendations  None recommended by PT    Recommendations for Other Services       Precautions / Restrictions Precautions Precautions: Back Precaution Comments: for comfort Restrictions Weight Bearing Restrictions: No      Mobility  Bed Mobility Overal bed mobility: Needs Assistance Bed Mobility: Supine to Sit;Sit to Supine     Supine to sit: Min guard;HOB elevated Sit to supine: Min guard   General bed mobility comments: verbal cues for sequencing  Transfers Overall transfer level: Needs assistance Equipment used: Rolling walker (2 wheeled) Transfers: Sit to/from [facility] Sit to Stand: Min guard Stand pivot transfers: Min guard       General transfer comment: verbal cues for hand placement  Ambulation/Gait Ambulation/Gait assistance: Min guard Ambulation Distance (Feet): 150 Feet Assistive device: Rolling walker (2 wheeled) Gait Pattern/deviations: Trunk flexed;Decreased stride length;Step-through pattern Gait velocity: decreased   General Gait Details: pelvis shifted to the right during stance/gait  Stairs            Wheelchair Mobility    Modified Rankin (Stroke Patients Only)       Balance                                              Pertinent Vitals/Pain Pain Assessment: 0-10 Pain Score: 8  Pain Location: left flank Pain Intervention(s): Monitored during session;Premedicated before session    Home Living Family/patient expects to be discharged to:: Private residence Living Arrangements: Spouse/significant other Available Help at Discharge: Family;Available 24 hours/day Type of Home: House Home Access: Stairs to enter Entrance Stairs-Rails: Right Entrance Stairs-Number of Steps: 3 Home Layout: One level Home Equipment: Walker - 4 wheels;Cane - single point      Prior Function Level of Independence: Independent with assistive device(s)               Hand Dominance        Extremity/Trunk Assessment                         Communication   Communication: No difficulties  Cognition Arousal/Alertness: Awake/alert Behavior During Therapy: Anxious Overall Cognitive Status: Within Functional Limits for tasks assessed                      General Comments      Exercises        Assessment/Plan    PT Assessment Patient needs continued PT services  PT Diagnosis Difficulty walking;Acute pain;Generalized weakness   PT Problem List Decreased activity tolerance;Decreased mobility;Pain  PT Treatment Interventions DME instruction;Gait training;Stair training;Functional mobility training;Therapeutic activities;Patient/family education;Balance training   PT Goals (Current goals can be found in the  Care Plan section) Acute Rehab PT Goals Patient Stated Goal: decrease pain in side PT Goal Formulation: With patient Time For Goal Achievement: 02/06/14 Potential to Achieve Goals: Good    Frequency Min 5X/week   Barriers to discharge        Co-evaluation               End of Session Equipment Utilized During Treatment: Gait belt Activity Tolerance: Patient tolerated treatment well Patient left: in bed;with call bell/phone within reach;with family/visitor  present Nurse Communication: Mobility status         Time: 1017-5102 PT Time Calculation (min): 17 min   Charges:   PT Evaluation $Initial PT Evaluation Tier I: 1 Procedure PT Treatments $Gait Training: 8-22 mins   PT G Codes:          Lorriane Shire 01/30/2014, 2:22 PM  Lorrin Goodell, PT  Office # [phone] Pager [phone]

## 2014-01-30 NOTE — Progress Notes (Signed)
Patient ID: Valerie Hanna, female   DOB: [birth year], 73 y.o.   MRN: [id number] Subjective: Patient reports continued left-sided rib cage pain that was worse with deep breaths. SHe thinks this is pleurisy and was recently treated with azithromycin and steroids. Needs help getting out of bed to the bathroom  Objective: Vital signs in last 24 hours: Temp:  [98.1 F (36.7 C)-99.3 F (37.4 C)] 98.1 F (36.7 C) (08/29 0514) Pulse Rate:  [87-103] 87 (08/29 0514) Resp:  [18-20] 20 (08/29 0514) BP: (123-168)/(64-81) 148/66 mmHg (08/29 0514) SpO2:  [93 %-98 %] 98 % (08/29 0514)  Intake/Output from previous day: 08/28 0701 - 08/29 0700 In: 960 [P.O.:960] Out: -  Intake/Output this shift:    Neurologic: Grossly normal to in bed exam  Lab Results: Lab Results  Component Value Date   WBC 14.8* 01/25/2014   HGB 12.2 01/25/2014   HCT 36.8 01/25/2014   MCV 101.7* 01/25/2014   PLT 380 01/25/2014   No results found for this basename: INR, PROTIME   BMET Lab Results  Component Value Date   NA 137 01/25/2014   K 5.3 01/25/2014   CL 99 01/25/2014   CO2 24 01/25/2014   GLUCOSE 135* 01/25/2014   BUN 11 01/25/2014   CREATININE 0.41* 01/25/2014   CALCIUM 10.0 01/25/2014    Studies/Results: Dg Lumbar Spine 2-3 Views  01/28/2014   CLINICAL DATA:  L4 kyphoplasty.  EXAM: DG C-ARM 61-120 MIN; LUMBAR SPINE - 2-3 VIEW  COMPARISON:  02/09/2010.  01/19/2014.  FINDINGS: AP and lateral fluoroscopic spot views demonstrate postprocedural changes of L4 vertebral augmentation.  IMPRESSION: Negative.   Electronically Signed   By: Dereck Ligas M.D.   On: 01/28/2014 16:18   Dg C-arm 1-60 Min  01/28/2014   CLINICAL DATA:  L4 kyphoplasty.  EXAM: DG C-ARM 61-120 MIN; LUMBAR SPINE - 2-3 VIEW  COMPARISON:  02/09/2010.  01/19/2014.  FINDINGS: AP and lateral fluoroscopic spot views demonstrate postprocedural changes of L4 vertebral augmentation.  IMPRESSION: Negative.   Electronically Signed   By: Dereck Ligas M.D.   On:  01/28/2014 16:18    Assessment/Plan: Still continued pain. I do not feel comfortable discharging her as I think she will be unstable on her feet.   LOS: 2 days    Al Bracewell S 01/30/2014, 8:38 AM     pros

## 2014-01-31 NOTE — Progress Notes (Signed)
Patient d/c to home in stable condition. D/c instruction given and patient verbalized understanding.

## 2014-01-31 NOTE — Progress Notes (Signed)
Physical Therapy Treatment Patient Details Name: DONNELLE RUBEY MRN: [id number] DOB: 08-21-1940 Today's Date: 02-19-2014    History of Present Illness Pt underwent kyphoplasty 01-28-14 due to a compression fx at L4.    PT Comments    Pt making steady progress with mobility. Safe for discharge home with family assistance at current mobility level.  Follow Up Recommendations  Home health PT     Equipment Recommendations  None recommended by PT    Precautions / Restrictions Precautions Precautions: Back Precaution Comments: for comfort Restrictions Weight Bearing Restrictions: No    Mobility  Bed Mobility   Bed Mobility: Sidelying to Sit   Sidelying to sit: Supervision       General bed mobility comments: cues for sequence with bed flat and rail used.  Transfers Overall transfer level: Needs assistance Equipment used: Rolling walker (2 wheeled) Transfers: Sit to/from Stand Sit to Stand: Supervision         General transfer comment: cues for hand placement/safety with transfers  Ambulation/Gait Ambulation/Gait assistance: Min guard;Supervision Ambulation Distance (Feet): 200 Feet Assistive device: Rolling walker (2 wheeled) Gait Pattern/deviations: Step-through pattern;Decreased stride length;Trunk flexed Gait velocity: decreased Gait velocity interpretation: Below normal speed for age/gender General Gait Details: pelvis shifted to the right during stance/gait with scoliatic appearance    Stairs Stairs: Yes Stairs assistance: Min guard Stair Management: Two rails;Step to pattern;Forwards Number of Stairs: 3 General stair comments: cues for sequence and technique with stairs  Wheelchair Mobility    Modified Rankin (Stroke Patients Only)       Cognition Arousal/Alertness: Awake/alert Behavior During Therapy: Anxious Overall Cognitive Status: No family/caregiver present to determine baseline cognitive functioning                        Pertinent Vitals/Pain Pain Assessment: 0-10 Pain Score: 7  Pain Location: left flank Pain Descriptors / Indicators: Sore;Aching Pain Intervention(s): Monitored during session;Premedicated before session;Repositioned     PT Goals (current goals can now be found in the care plan section) Acute Rehab PT Goals Patient Stated Goal: decrease pain in side PT Goal Formulation: With patient Time For Goal Achievement: 02/06/14 Potential to Achieve Goals: Good Progress towards PT goals: Progressing toward goals    Frequency  Min 5X/week    PT Plan Current plan remains appropriate    End of Session Equipment Utilized During Treatment: Gait belt Activity Tolerance: Patient tolerated treatment well Patient left: in chair;with call bell/phone within reach;Other (comment);with chair alarm set (with md in room)     Time: 1024-1040 PT Time Calculation (min): 16 min  Charges:  $Gait Training: 8-22 mins                    G Codes:      Willow Ora 2014/02/19, 10:46 AM  Willow Ora, PTA Office- [phone]

## 2014-01-31 NOTE — Discharge Summary (Signed)
Physician Discharge Summary  Patient ID: Valerie Hanna MRN: [id number] DOB/AGE: 1941/04/27 73 y.o.  Admit date: 01/28/2014 Discharge date: 01/31/2014  Admission Diagnoses: Compression fracture  Discharge Diagnoses: Same Active Problems:   Compression fracture of body of thoracic vertebra   Compression fracture   Discharged Condition: Stable  Hospital Course:  Mrs. Valerie Hanna is a 73 y.o. female electively admitted after undergoing kyphoplasty. She was at her neurologic baseline postoperatively. She was ambulatory with physical therapy, who recommended home health PT. She did continue to complain of some left-sided pleuritic chest pain which she has seen her primary care physician for prior to her admission. She reported some improvement in her back pain after the kyphoplasty.  Treatments: Surgery - L4 kyphoplasty  Discharge Exam: Blood pressure 147/65, pulse 72, temperature 97.9 F (36.6 C), temperature source Axillary, resp. rate 20, SpO2 99.00%. Awake, alert, oriented Speech fluent, appropriate CN grossly intact 5/5 BUE/BLE Wound c/d/i  Follow-up: Follow-up in Dr. Lacy Duverney office [facility] [phone]) in 4 weeks  Disposition: Home  Discharge Instructions   Ambulatory referral to [place]    Complete by:  As directed   Please evaluate [name] for admission to [facility]  Disciplines requested: Physical Therapy  Services to provide: Evaluate  Physician to follow patient's care (the person listed here will be responsible for signing ongoing orders): Other: Dr. Christella Noa  Requested Start of Care Date: Today (Please call ahead to confirm availability)  I certify that this patient is under my care and that I, or a Nurse Practitioner or Physician's Assistant working with me, had a face-to-face encounter that meets the physician face-to-face requirements with patient on this day, Jan 31, 2014. The encounter with the patient was in  whole, or in part for the following medical condition(s) which is the primary reason for home health care (List medical condition). L4 Fracture  Special Instructions: None  The encounter with the patient was in whole, or in part, for the following medical condition, which is the primary reason for home health care:  Compression Fracture  I certify that, based on my findings, the following services are medically necessary home health services:  Physical therapy  My clinical findings support the need for the above services:  Pain interferes with ambulation/mobility  Further, I certify that my clinical findings support that this patient is homebound due to:  Pain interferes with ambulation/mobility  Reason for Medically Necessary Home Health Services:  Therapy- Home Adaptation to Facilitate Safety  Does the patient have Medicare or Medicaid?:  Yes            Medication List         ALPRAZolam 0.25 MG tablet  Commonly known as:  XANAX  Take 0.25 mg by mouth at bedtime as needed for anxiety.     CALCIUM PO  Take 1 tablet by mouth daily.     clopidogrel 75 MG tablet  Commonly known as:  PLAVIX  Take 75 mg by mouth daily with breakfast.     HYDROcodone-acetaminophen 5-325 MG per tablet  Commonly known as:  NORCO/VICODIN  Take 1 tablet by mouth every 6 (six) hours as needed for moderate pain.     HYDROcodone-acetaminophen 5-325 MG per tablet  Commonly known as:  NORCO/VICODIN  Take 1 tablet by mouth every 6 (six) hours as needed for moderate pain.     ipratropium 0.06 % nasal spray  Commonly known as:  ATROVENT  Place 2 sprays into both  nostrils 2 (two) times daily.     multivitamin capsule  Take 1 capsule by mouth daily.     pantoprazole 40 MG tablet  Commonly known as:  PROTONIX  Take 40 mg by mouth 2 (two) times daily.     predniSONE 10 MG tablet  Commonly known as:  STERAPRED UNI-PAK  Take by mouth daily.     sertraline 50 MG tablet  Commonly known as:  ZOLOFT  Take  50 mg by mouth at bedtime.     simvastatin 40 MG tablet  Commonly known as:  ZOCOR  Take 40 mg by mouth at bedtime.     VITAMIN B-12 PO  Take 1 tablet by mouth daily.           Follow-up Information   Follow up with CABBELL,KYLE L, MD In 1 month. (call office to make an appointment)    Specialty:  Neurosurgery   Contact information:   [place] [address] [phone]       Signed: Consuella Lose, Loletha Grayer 01/31/2014, 12:14 PM

## 2014-08-30 ENCOUNTER — Ambulatory Visit: Payer: Self-pay | Admitting: Ophthalmology

## 2014-09-08 ENCOUNTER — Ambulatory Visit
Admit: 2014-09-08 | Disposition: A | Discharge status: Home / Self Care | Payer: Self-pay | Attending: Ophthalmology | Admitting: Ophthalmology

## 2014-09-24 NOTE — H&P (Signed)
PATIENT NAME:  Valerie Hanna, Valerie Hanna MR#:  [id number] DATE OF BIRTH:  1940/11/03  DATE OF ADMISSION:  01/31/2013  ADDENDUM: This is a continuation.  IMPRESSION AND PLAN: Tobacco abuse: The patient was counseled for about 3 minutes. She is not ready to quit yet. Denies any need for nicotine replacement therapy while in the hospital.    ____________________________ Wilbert Hayashi S. Manuella Ghazi, MD vss:jm D: 01/31/2013 16:07:18 ET T: 01/31/2013 16:37:09 ET JOB#: [id number]  cc: Yanique Mulvihill S. Manuella Ghazi, MD, <Dictator> Lucina Mellow [facility] MD ELECTRONICALLY SIGNED 02/02/2013 12:11

## 2014-09-24 NOTE — H&P (Signed)
PATIENT NAME:  Valerie Hanna, Valerie Hanna MR#:  [id number] DATE OF BIRTH:  1941/02/21  DATE OF ADMISSION:  01/31/2013  PRIMARY CARE PHYSICIAN: Dr. Halina [facility]idens at [facility].  REQUESTING PHYSICIAN: Dr. Lavonia Drafts.   CHIEF COMPLAINT: Left-sided weakness.   HISTORY OF PRESENT ILLNESS: The patient is a 74 year old female with known history of hypertension, hyperlipidemia, depression, anxiety, is being admitted for possible new stroke. The patient woke up this morning with left-sided weakness. Her left leg gave out and she was unable to bear weight on it, decided to come to the Emergency Department. She is still weak in the left lower extremity and is being admitted for stroke work-up.   PAST MEDICAL HISTORY: 1.  Hypertension.  2.  Hyperlipidemia.  3.  Anxiety.  4.  GERD.  5.  Depression.  6.  Irritable bowel syndrome.  7.  Left adnexal mass.   PAST SURGICAL HISTORY:   1.  Total vaginal hysterectomy.  2.  Cholecystectomy.  3.  Back surgery.  4.  Excision of skin cancer lesion.   DRUG ALLERGIES: AMOXICILLIN, ASPIRIN, SERZONE AND WELLBUTRIN.   FAMILY HISTORY: Negative for cancer.   SOCIAL HISTORY: Smokes 5 to 6 cigarettes daily for 40 to 50 years. No alcohol or IV drug abuse.   MEDICATIONS AT HOME:   1.  Alprazolam 0.25 mg p.o. at bedtime.  2.  Prilosec OTC 20 mg p.o. b.i.d. 3.  Sertraline 50 mg p.o. daily.  REVIEW OF SYSTEMS: CONSTITUTIONAL: No fever, fatigue. Positive for lower extremity weakness.  EYES: No blurred or double vision.  ENT: No tinnitus or ear pain.  RESPIRATORY: No cough, wheezing, hemoptysis.  CARDIOVASCULAR: No chest pain, orthopnea, edema. GASTROINTESTINAL: No nausea, vomiting, diarrhea. GENITOURINARY: No dysuria or hematuria. ENDOCRINE: No polyuria or nocturia. HEMATOLOGIC: No anemia or easy bruising.  SKIN: No rash or lesion.  MUSCULOSKELETAL: No arthritis or muscle cramps. NEUROLOGIC: Positive for left lower extremity weakness. No tingling. Positive for  some numbness on the left lower extremity.  PSYCHIATRIC: Positive for anxiety and depression.   PHYSICAL EXAMINATION: VITAL SIGNS: Temperature 97.9, heart rate 107 per minute, respiration 18 per minute, blood pressure 133/70 mmHg. She is saturating 96% on room air.  GENERAL: The patient is a 74 year old female lying in the bed comfortably without any acute distress.  EYES: Pupils equal, round, reactive to light and accommodation. No scleral icterus. Extraocular muscles intact.   HENT: Head atraumatic, normocephalic. Oropharynx and nasopharynx clear.  NECK: Supple. No jugular venous distention. No thyroid enlargement or tenderness.  LUNGS: Clear to auscultation bilaterally. No wheezing, rales, rhonchi or crepitation.  CARDIOVASCULAR: S1, S2 normal. No murmurs, rubs or gallops. ABDOMEN: Soft, nontender, nondistended. Bowel sounds present. No organomegaly or mass.  EXTREMITIES: No pedal edema, cyanosis or clubbing.  NEUROLOGIC: Cranial nerves II through XII intact.  Muscle strength 5 out of 5 in all extremities, except left lower extremity which has 1 to 2 out of 5. Sensation intact. PSYCHIATRIC: The patient is alert and oriented x 3.  SKIN: No obvious rash, lesion, ulcer.   LABORATORY, DIAGNOSTIC AND RADIOLOGICAL DATA: Normal BMP except sodium 127, chloride of 91. Liver function tests showed AST of 60, alkaline phosphatase of 174. CBC within normal limits. Urinalysis was negative.   Chest x-ray in the ED showed no acute cardiopulmonary disease.   CT scan of the head without contrast showed no acute intracranial pathology. Old subcentimeter lacunar infarct present in the caudate nuclei. No evidence of acute skull fracture.  EKG shows sinus tachycardia with  a rate of 107 per minute, no major ST-T changes.   IMPRESSION AND PLAN: 1.  Possible new cerebrovascular accident with left-sided weakness, mainly in the lower extremities: Will get MRI of the brain, carotid Dopplers. Obtain neurology  consultation. Will start her on Plavix, as SHE IS ALLERGIC TO ASPIRIN. Check fasting lipid profile. Start her on statin. Consult physical and occupational therapy.  2.  Hypertension: Will let it autoregulate with systolic blood pressure level of 140 to 160 in acute cerebrovascular accident phase.  3.  Hyponatremia, likely due to dehydration: Will start her on IV fluids and monitor her sodium  4.  Gastroesophageal reflux disease: Will continue Prilosec. 5.  For anxiety and depression, we will continue sertraline and alprazolam.   TOTAL TIME TAKING CARE OF THIS PATIENT: 55 minutes.   CODE STATUS: Full code.    ____________________________ Lucina Mellow. Manuella Ghazi, MD vss:jm D: 01/31/2013 16:03:39 ET T: 01/31/2013 17:11:29 ET JOB#: [id number]  cc: Chauncy Mangiaracina S. Manuella Ghazi, MD, <Dictator> Halina Maidens, MD [place] MD ELECTRONICALLY SIGNED 02/02/2013 16:40

## 2014-09-24 NOTE — Discharge Summary (Signed)
PATIENT NAME:  Valerie Hanna, HAMMAD MR#:  [id number] DATE OF BIRTH:  1941/02/05  DATE OF ADMISSION:  01/31/2013 DATE OF DISCHARGE:  02/03/2013  ADMITTING DIAGNOSIS: Left-sided weakness.   DISCHARGE DIAGNOSES: 1.  Left-sided weakness due to acute cerebrovascular accident involving the posterior aspect of the corpus callosum on the right as well as old lacunar infarct in the caudate nucleus on the left.  2.  Hypertension.  3.  Hyperlipidemia.  4.  Anxiety.  5.  Gastroesophageal reflux disease.   6.  Hyponatremia.   7.  Hypokalemia.  8.  Depression.  9.  Irritable bowel syndrome.  10.  History of left adnexal mass.  11.  Status post total vaginal hysterectomy.  12.  Status post cholecystectomy.  13.  Status post back surgery.  14.  Status post excision of skin cancer lesion.   PERTINENT LABS AND EVALUATIONS: Admitting glucose was 115, BUN 8, creatinine 0.63, sodium was 127, potassium 3.9, chloride was 91, CO2 was 23, calcium was 8.8. LFTs: Total protein 6.0, albumin 2.4, bilirubin total 0.9, alkaline phosphatase was 174, AST was 60. CPK was 4.3, troponins 0.03. WBC 8.6, hemoglobin 14.1, platelet count 261. CT scan of the head showed no evidence of acute ischemia. MRI of the brain without contrast showed findings consistent with acute ischemic infarct measuring 1 x 1.5 cm in the posterior aspect of the corpus callosum on the right, white matter changes consistent with chronic small vessel changes, old lacunar infarct in the caudate nucleus on the left.  Bilateral carotid Dopplers showed bilateral plaques.  ASSESSMENT AND PLAN: The patient is a 74 year old white female with history of hypertension, hyperlipidemia, and depression and anxiety who presented with left-sided weakness. The patient was evaluated in CT, had a CT scan of the head, which was negative. She went for further evaluation and had an MRI of the brain as well as carotid Dopplers and 2-D echo. The patient was noted to have acute  cerebrovascular accident as described above.  The patient is ALLERGIC TO ASPIRIN, so she was started on Plavix. Carotid Doppler did show bilateral plaques, likely the cause for her cerebrovascular accident. There was no stenosis. She also had echo without evidence of any thrombus. The patient was seen by Neurology, who agreed with the current treatment. She still has significant weakness, therefore, arrangements for skilled nursing facility are being made. Today she is complaining of urinary frequency and burning. We will be checking a urinalysis and I will start her on Cipro. At this time, she is stable for discharge.   DISCHARGE MEDICATIONS: Prilosec 20, one tab p.o. b.i.d., sertraline 50 daily, alprazolam 0.25, one tab p.o. at bedtime, simvastatin 40 at bedtime, Plavix 75 p.o. daily, ipratropium nasally 2 sprays 2 times a day, mag oxide 400,  one tab p.o. b.i.d., Cipro 500 mg 1 tab p.o. q. 12 x 5 days. K-Dur 20 mEq p.o. daily x4 days.   DIET: Low sodium, low fat, low cholesterol.   ACTIVITY: As tolerated.   The patient to have PT and OT evaluation and treatment. Follow up with primary M.D. in 1 to 2 weeks.   NOTE: 50 minutes spent on this discharge.     ____________________________ Lafonda Mosses. Posey Pronto, MD shp:dp D: 02/03/2013 11:13:16 ET T: 02/03/2013 11:33:32 ET JOB#: [id number]  cc: Gabino Hagin H. Posey Pronto, MD, <Dictator> Alric Seton MD ELECTRONICALLY SIGNED 02/06/2013 15:10

## 2014-09-24 NOTE — Consult Note (Signed)
PATIENT NAME:  Valerie Hanna, Valerie Hanna MR#:  [id number] DATE OF BIRTH:  1940-06-18  DATE OF CONSULTATION:  02/01/2013  REFERRING PHYSICIAN:   CONSULTING PHYSICIAN:  Leotis Pain, MD  REASON FOR CONSULTATION:  Stroke.   HISTORY OF PRESENTING ILLNESS: This is a pleasant 74 year old female with past medical history of hypertension, hyperlipidemia, depression, anxiety, chronic nausea and vomiting, being admitted after waking up and the patient was found to have left upper and left lower extremity weakness that is new. The patient came to the Emergency Room status post imaging with MRI findings of right corpus callosum infarct 1.5 x 1 cm in size. The patient states the left-sided weakness has improved significantly as of yesterday but still complains of slight nausea and vomiting.   PAST MEDICAL HISTORY: Hypertension, hyperlipidemia, GERD, depression, irritable bowel syndrome, left adrenal mass.   PAST SURGICAL HISTORY: Total vaginal hysterectomy, cholecystectomy, back surgery, excision of skin cancer lesion.   FAMILY HISTORY: Negative for any cancers. Family history of depression.   ALLERGIES: INCLUDE AMOXICILLIN, ASPIRIN, SERZONE AND WELLBUTRIN.   SOCIAL HISTORY: Smokes 5 to 6 cigarettes a day for the past 40 to 50 years. No alcohol use.   MEDICATIONS: At home include alprazolam, Prilosec and sertraline.   REVIEW OF SYSTEMS CONSTITUTIONAL:  Currently no fever. No fatigue.  EYES:  No blurred vision.  EARS:  No tinnitus. No ear pain.  GASTROINTESTINAL:  Positive for nausea, vomiting.  GENITOURINARY:  No dysuria or hematuria.  HEMATOLOGIC:  No polyuria.  HEMATOLOGIC:  No anemia or easy bruising.  MUSCULOSKELETAL:  No arthritis or muscle cramps.  NEURO:  Left-sided weakness from a neurological prospective.  PSYCHIATRIC: Positive for anxiety and depression.   LABORATORY DATA:  Cholesterol workup included triglycerides of 70, HDL of 67. Glucose is 87, BUN 7, creatinine is 0.61, sodium is 130,  potassium 3.2, magnesium is 1.2. The patient's urinalysis is negative.   RADIOLOGY: Ultrasound and carotid Doppler performed and there were no signs of hemodynamic stenosis. The patient is currently getting an echo done. MRI of brain showed right corpus callosum infarct as described above. CT head did not show any evidence of acute infarction or consistent with small vessel disease   PHYSICAL EXAMINATION  VITAL SIGNS: Include a temperature of 97.8, pulse 95, respirations 16, blood pressure 136/77, pulse oximetry is 100%.  GENERAL:  The patient is alert, awake, oriented to time, place, location, date, President of the [place] and the reason why she is in the hospital, and able to tell me how many quarters make up a dollar.  NEUROLOGIC EVALUATION: Cranial nerve examination: Extraocular movements appear to be intact. Visual fields are intact. Facial sensation and facial motor is intact. Tongue is midline. Uvula elevates symmetrically. Shoulder shrug intact. Motor strength examination: The patient is 4 out of 5 in the left upper extremity and 5 out of 5 right upper extremity. The left lower extremity is 4+ out of 5 and right lower extremity is 5 out of 5. Reflexes symmetrical 2+ bilaterally. Sensation is intact. Coordination: Finger-to-nose intact. Gait could not be assessed.   IMPRESSION: A 74 year old female with hypertension, hyperlipidemia, depression, anxiety, presenting with acute onset of left-sided weakness confirmed as a right corpus callosum infarct based on MRI that is 1.5 x 1 cm. Workup included a 2-D echo which is currently pending, carotid Doppler which did not show any signs of hemodynamic stenosis.   PLAN: Agree with Plavix as the patient is allergic to aspirin. The patient should also be on a  statin. Physical therapy and occupational therapy. If they feel the patient is safe to be discharged,  that would be okay from a neurological prospective and have official echo read as outpatient.  Zofran for nausea, vomiting as this is not an acute problem as the patient has been having  this problem has outpatient as well.   Thank you. It was a pleasure seeing this patient.   ____________________________ Leotis Pain, MD yz:cs D: 02/01/2013 12:14:22 ET T: 02/01/2013 19:03:26 ET JOB#: [id number]  cc: Leotis Pain, MD, <Dictator> Leotis Pain MD ELECTRONICALLY SIGNED 02/16/2013 12:12

## 2014-09-26 NOTE — Op Note (Signed)
PATIENT NAME:  Valerie Hanna, Valerie Hanna MR#:  [id number] DATE OF BIRTH:  [birth year]  DATE OF PROCEDURE:  07/16/2011  PREOPERATIVE DIAGNOSIS: Left adnexal mass, multicystic.   POSTOPERATIVE DIAGNOSES:  1. Left adnexal mass, multicystic.  2. Dense pelvic adhesions.   OPERATIVE PROCEDURE: Laparoscopic adhesiolysis and bilateral salpingo-oophorectomy.   SURGEON: Alanda Slim. Mayana Irigoyen, M.D.   FIRST ASSISTANT: None.   ANESTHESIA: General endotracheal.   INDICATIONS: The patient is a 74 year old married white female, para 1-0-4-1, status post transvaginal hysterectomy in her late 86s, who presents for surgical management of a septated left adnexal cyst that was identified on CT scan during work-up for evaluation of abdominal pain, diarrhea, and weight loss. On CT scan there was a 7.5 x 5.0 cm multicystic mass in the left hemipelvis. There was no retroperitoneal adenopathy. CA-125 was normal at 18.2.   FINDINGS AT SURGERY: Extensive small bowel/adnexal/pelvic sidewall adhesions that required approximately 90 minutes of adhesiolysis before restoring normal anatomy. The left ovary was multicystic with smooth external surface, except for adhesions. The cyst fluid content was serous in nature. The right tube and ovary was notable for adhesions as well as a small atrophic ovary. The ureters on the left and right side of the pelvis were noted to have normal peristalsis post procedure.   DESCRIPTION OF PROCEDURE: The patient was brought to the Operating Room where she was placed in the supine position. General endotracheal anesthesia was induced without difficulty. A ChloraPrep and Betadine abdominal, perineal, and intravaginal prep and drape was performed in the standard fashion. A red Robinson catheter was used to drain 50 mL of urine from the bladder. A sponge stick was placed into the vagina to facilitate anatomic orientation. A subumbilical vertical incision 5 mm in length was made. The Optiview laparoscopic  trocar system was used to place a 5 mm port with direct entry into the abdomen. No bowel or vascular injury was encountered. Pneumoperitoneum was created with CO2 gas. A series of three more ports were then placed in the right lower quadrant, left lower quadrant, and left mid quadrants, respectively, under direct visualization. The grasper, laparoscopic peanut, and fan retractor were used along with the Ace Harmonic scalpel to both sharply and bluntly dissect the pelvis free of adhesions. The ovary was mobilized in a well-defined process that required approximately 90 minutes of adhesiolysis. Once mobilized from the pelvic sidewall, the infundibulopelvic ligament was clamped, coagulated, and cut using the Harmonic scalpel. Again adhesions between the ovary and the pelvic sidewall were then likewise lysed with the Harmonic scalpel. Once adequately mobilized, the 7 cm mass was then placed in an Endo Catch pouch and was brought to the surface of the abdomen. The cyst was decompressed with a needle aspirator and the ovary and tube were removed from the 11 mm port site. No intra-abdominal leakage of fluid was encountered. The right tube and ovary were then removed in the standard fashion using the Ace Harmonic scalpel with care to avoid retroperitoneal structures on the pelvic sidewall. Good hemostasis was obtained. The tube and ovary, which was atrophic, were removed from the 11 mm port site. Copious irrigation of the pelvis was then performed. Inspection of the pelvis revealed good hemostasis. Bowel dissection appeared healthy without evidence of compromise of any serosa. The pelvic sidewalls were notable for easy visualization of the ureters, on the left and right pelvic brim regions, which revealed normal functioning of the ureter. The procedure was then terminated with all instrumentation being removed from the abdominopelvic cavity. The  fascia was closed with a figure-of-eight stitch of 0 Vicryl. The 11 mm incision  site, which had been enlarged slightly to evacuate the 7 cm cystic mass, was closed with a subcuticular stitch of 4-0 plain. All the remainder of the incisions were closed with Dermabond glue. The patient was then awakened, extubated, and taken to the Recovery Room in satisfactory condition. Estimated blood loss was less than 25 mL. There were no complications. All instruments, needle, and sponge counts were verified as correct.  ____________________________ Alanda Slim. Shanielle Correll, MD mad:slb D: 07/16/2011 20:04:09 ET T: 07/17/2011 09:27:34 ET JOB#: [id number]  cc: Hassell Done A. Joahan Swatzell, MD, <Dictator> Alanda Slim Arizbeth Cawthorn MD ELECTRONICALLY SIGNED 07/20/2011 14:40

## 2014-09-26 NOTE — H&P (Signed)
PATIENT NAME:  Valerie, Hanna MR#:  [id number] DATE OF BIRTH:  [birth year]  DATE OF ADMISSION:  07/16/2011  CHIEF COMPLAINT: Left adnexal mass.  HISTORY OF PRESENT ILLNESS: Valerie Hanna is a 74 year old married white female, para 1-0-4-1, status post transvaginal hysterectomy in her late 43s, without history of abnormal Pap smears, on no HRT therapy, who presents for surgical management of a septated left adnexal cyst that was identified on CT scan during work-up for evaluation of abdominal pain, diarrhea, and weight loss. The patient has since undergone EGD and colonoscopy recently. The patient has had 40 pound weight loss over the past eight months. Source is unknown.   CT scan from 05/24/2011 revealed a large cystic mass in the left hemipelvis that contained a septation. It measures 7.5 x 5.0 cm. There was no retroperitoneal adenopathy. Since that time, the patient had an ultrasound on 06/13/2011, which revealed a multicystic left adnexal mass measuring 6.49 cm in maximum diameter. There are possibly two or three cystic masses within the ovary. There was no ascites. The right ovary was not visualized. A CA-125 from 06/12/2011 was 18.2. Physical examination was unremarkable for a palpable mass. The patient is counseled to undergo removal of this adnexal mass, as well as the other ovary.   PAST MEDICAL HISTORY:  1. Hypertension.  2. Hyperlipidemia.  3. Gastroesophageal reflux disease.  4. Depression and anxiety.  5. Irritable bowel syndrome.  6. Left adnexal mass.   PAST SURGICAL HISTORY:  1. Total vaginal hysterectomy.  2. Cholecystectomy.  3. Back surgery.  4. Excision of skin cancer lesions.  5. EGD and colonoscopy.   MEDICATIONS:  1. Lisinopril 10 mg daily. 2. Zolpidem 10 mg a day. 3. Prilosec 20 mg once a day. 4. Zoloft 25 mg once a day.  5. MiraLax daily.   DRUG ALLERGIES: Amoxicillin, aspirin, Serzone, Wellbutrin.  PAST OB HISTORY: Para 1-0-4-1, SAB x4. Spontaneous vaginal  delivery x1.   PAST GYN HISTORY: No history of abnormal Pap smears. No history of ERT use.   FAMILY HISTORY: Negative for cancer of the colon, ovary, or breast.   SOCIAL HISTORY: The patient does not smoke. She does drink alcohol, approximately once a night.   REVIEW OF SYSTEMS: No recent sore throat, cough, congestion, or antibiotic use.   PHYSICAL EXAMINATION:   VITAL SIGNS: Height 5 feet 2 inches, weight 121 pounds, blood pressure 120/72, heart rate 84, BMI 22.  GENERAL: The patient is a pleasant, elderly frail-appearing female in no acute distress. She is alert and oriented. She is slow moving.   HEENT: Oropharynx is clear.   NECK: Supple. There is no thyromegaly or adenopathy.   LUNGS: Clear.   HEART: Regular rate and rhythm without murmur.   ABDOMEN: Soft and nontender. No organomegaly. No pelvic masses.   PELVIC: External genitalia with atrophic changes. BUS normal. Vagina has decreased estrogen affect. There is good vault support. Bimanual exam reveals no palpable adnexal mass or tenderness.   IMPRESSION:  1. Left adnexal mass, 7.5 cm, complex.  2. Normal CA-125.  PLAN: Laparoscopic bilateral salpingo-oophorectomy. Date of surgery is 07/16/2011.  CONSENT NOTE: The patient is to undergo laparoscopic bilateral salpingo-oophorectomy. She is understanding of the planned procedures and is aware of and accepting of all surgical risks which include but are not limited to bleeding, infection, pelvic organ injury with need for repair, blood clot disorders, anesthesia risks, and death. All questions are answered. Informed consent is given. The patient is ready and willing to proceed with  surgery as scheduled. ____________________________ Alanda Slim Valdis Bevill, MD mad:slb D: 07/10/2011 14:31:40 ET T: 07/10/2011 15:04:09 ET JOB#: [id number]  cc: Hassell Done A. Giani Winther, MD, <Dictator> Alanda Slim Allice Garro MD ELECTRONICALLY SIGNED 07/20/2011 14:38

## 2014-09-29 DIAGNOSIS — K2270 Barrett's esophagus without dysplasia: Secondary | ICD-10-CM | POA: Insufficient documentation

## 2014-09-29 DIAGNOSIS — F411 Generalized anxiety disorder: Secondary | ICD-10-CM | POA: Insufficient documentation

## 2014-09-29 DIAGNOSIS — R634 Abnormal weight loss: Secondary | ICD-10-CM | POA: Insufficient documentation

## 2014-09-29 DIAGNOSIS — IMO0002 Reserved for concepts with insufficient information to code with codable children: Secondary | ICD-10-CM | POA: Insufficient documentation

## 2014-09-29 DIAGNOSIS — I69359 Hemiplegia and hemiparesis following cerebral infarction affecting unspecified side: Secondary | ICD-10-CM | POA: Insufficient documentation

## 2014-09-29 DIAGNOSIS — K2100 Gastro-esophageal reflux disease with esophagitis, without bleeding: Secondary | ICD-10-CM | POA: Insufficient documentation

## 2014-09-29 DIAGNOSIS — M810 Age-related osteoporosis without current pathological fracture: Secondary | ICD-10-CM | POA: Insufficient documentation

## 2014-09-29 DIAGNOSIS — F324 Major depressive disorder, single episode, in partial remission: Secondary | ICD-10-CM | POA: Insufficient documentation

## 2014-09-29 DIAGNOSIS — E785 Hyperlipidemia, unspecified: Secondary | ICD-10-CM | POA: Insufficient documentation

## 2014-09-29 DIAGNOSIS — Z8781 Personal history of (healed) traumatic fracture: Secondary | ICD-10-CM | POA: Insufficient documentation

## 2014-10-03 NOTE — Op Note (Signed)
PATIENT NAME:  Valerie Hanna, SIMMON MR#:  [id number] DATE OF BIRTH:  1940-08-16  DATE OF PROCEDURE:  09/08/2014  PREPROCEDURE DIAGNOSIS: Macular hole, right eye.   POSTPROCEDURE DIAGNOSIS:  Macular hole, right eye.  PROCEDURE: A 25-gauge pars plana vitrectomy with ICG membrane peel, ERM and ILM peeling, air-fluid exchange and 97:9% SF6.   COMPLICATIONS: None.   BLOOD LOSS: Minimal.   ANESTHESIA: Block with monitored anesthesia care.   PROCEDURE NOTE: The patient was evaluated in the clinic for a macular hole in the right eye. Of note, she had a previous vitrectomy with membrane peeling for a macular hole and her hole had recently reopened.  She did have an epiretinal membrane on the OCT. Risks, benefits, alternatives, and complications were discussed with patient. The patient elected to proceed with 25-gauge pars plana vitrectomy with membrane peeling.   On the day of surgery, the patient was greeted in the preoperative holding area. The consents were reviewed, the right eye was marked. The patient was brought into the operating room in supine position.  Monitored anesthesia care was administered and 5 mL of a peribulbar block consisting of Marcaine plain, lidocaine plain, and Wydase was injected. The patient was then prepped and draped in the usual sterile fashion. The 25-gauge trocars were used. The infusion cannula was checked to make sure it was in the vitreous cavity before starting the infusion.  As this patient did have a previous vitrectomy surgery, the periphery was inspected and there was very little vitreous to remove. ICG was then applied to the retinal surface and ILM forceps were then used to attempt membrane peeling.  The ERM was very difficult to remove and required multiple techniques including forcep peeling, the Tano scraper, as well as using a 25-gauge barbed needle to engage the tissue. A 360 degree scleral depress examination was performed. There were no additional tears or breaks.   Air-fluid exchange was then performed and 4 times the vitreous volume of 28:8% SF6 was infused into the eye. The trocars were then removed and sutured. The eye had an acceptable IOP by palpation. Subconjunctival dexamethasone was injected. The patient was patched and shielded with Neo-Poly-Dex ointment and taken to the recovery area in stable condition.   ____________________________ Laban Emperor Oval Linsey, MD jdr:sp D: 09/08/2014 09:02:27 ET T: 09/08/2014 09:48:28 ET JOB#: [id number]  cc: Janett Billow D. Oval Linsey, MD, <Dictator> Laban Emperor [facility] MD ELECTRONICALLY SIGNED 09/08/2014 10:25

## 2014-11-09 ENCOUNTER — Other Ambulatory Visit: Payer: Self-pay | Admitting: Internal Medicine

## 2014-11-17 ENCOUNTER — Ambulatory Visit (INDEPENDENT_AMBULATORY_CARE_PROVIDER_SITE_OTHER): Payer: PPO

## 2014-11-17 ENCOUNTER — Ambulatory Visit (INDEPENDENT_AMBULATORY_CARE_PROVIDER_SITE_OTHER): Payer: PPO | Admitting: Podiatry

## 2014-11-17 VITALS — BP 110/68 | HR 60 | Resp 16

## 2014-11-17 DIAGNOSIS — M79673 Pain in unspecified foot: Secondary | ICD-10-CM | POA: Diagnosis not present

## 2014-11-17 DIAGNOSIS — B351 Tinea unguium: Secondary | ICD-10-CM | POA: Diagnosis not present

## 2014-11-17 DIAGNOSIS — G5781 Other specified mononeuropathies of right lower limb: Secondary | ICD-10-CM

## 2014-11-17 DIAGNOSIS — M2041 Other hammer toe(s) (acquired), right foot: Secondary | ICD-10-CM | POA: Diagnosis not present

## 2014-11-17 DIAGNOSIS — G5761 Lesion of plantar nerve, right lower limb: Secondary | ICD-10-CM

## 2014-11-18 NOTE — Progress Notes (Signed)
She presents today for her right foot complaining that her toes have been drawing up spasms in the bottom of her foot.  Objective: Vital signs are stable she is alert and oriented 3 she has pain on palpation to the third interdigital space fourth and second interdigital space. Hammertoe deformities are visible she has pain on palpation to these interspaces. Radiograph confirms hammertoe deformities bilaterally which are rigid in nature. Osteopenia is also noted. Without fracture. She has no palpation to elongated nails 1 through 5 bilaterally are thick yellow dystrophic and clinically mycotic.  Assessment: Probable neuroma contracture deformities of the toe osteopenia. Painless segment onychomycosis.  Plan: Injected second third and fourth interdigital spaces of the right foot today with Kenalog and local anesthesia that should relieve her symptoms. Debridement of nails 1 through 5 bilateral covers are secondary to pain.

## 2014-12-01 ENCOUNTER — Encounter: Payer: Self-pay | Admitting: Internal Medicine

## 2014-12-01 ENCOUNTER — Ambulatory Visit (INDEPENDENT_AMBULATORY_CARE_PROVIDER_SITE_OTHER): Payer: PPO | Admitting: Internal Medicine

## 2014-12-01 VITALS — BP 130/76 | HR 80 | Ht 63.5 in | Wt 123.0 lb

## 2014-12-01 DIAGNOSIS — F411 Generalized anxiety disorder: Secondary | ICD-10-CM | POA: Diagnosis not present

## 2014-12-01 DIAGNOSIS — Z0000 Encounter for general adult medical examination without abnormal findings: Secondary | ICD-10-CM

## 2014-12-01 DIAGNOSIS — K2100 Gastro-esophageal reflux disease with esophagitis, without bleeding: Secondary | ICD-10-CM

## 2014-12-01 DIAGNOSIS — Z1239 Encounter for other screening for malignant neoplasm of breast: Secondary | ICD-10-CM

## 2014-12-01 DIAGNOSIS — E785 Hyperlipidemia, unspecified: Secondary | ICD-10-CM

## 2014-12-01 DIAGNOSIS — R490 Dysphonia: Secondary | ICD-10-CM

## 2014-12-01 LAB — POCT URINALYSIS DIPSTICK
BILIRUBIN UA: NEGATIVE
Blood, UA: NEGATIVE
Glucose, UA: NEGATIVE
KETONES UA: NEGATIVE
LEUKOCYTES UA: NEGATIVE
Nitrite, UA: NEGATIVE
PROTEIN UA: NEGATIVE
Spec Grav, UA: 1.01
Urobilinogen, UA: 0.2
pH, UA: 5

## 2014-12-01 MED ORDER — SERTRALINE HCL 100 MG PO TABS: 100.0000 mg | ORAL_TABLET | Freq: Every day | ORAL | Status: DC

## 2014-12-01 MED ORDER — AMOXICILLIN 875 MG PO TABS: 875.0000 mg | ORAL_TABLET | Freq: Two times a day (BID) | ORAL | Status: DC
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
## 2014-12-01 NOTE — Patient Instructions (Addendum)
Breast Self-Awareness Practicing breast self-awareness may pick up problems early, prevent significant medical complications, and possibly save your life. By practicing breast self-awareness, you can become familiar with how your breasts look and feel and if your breasts are changing. This allows you to notice changes early. It can also offer you some reassurance that your breast health is good. One way to learn what is normal for your breasts and whether your breasts are changing is to do a breast self-exam. If you find a lump or something that was not present in the past, it is best to contact your caregiver right away. Other findings that should be evaluated by your caregiver include nipple discharge, especially if it is bloody; skin changes or reddening; areas where the skin seems to be pulled in (retracted); or new lumps and bumps. Breast pain is seldom associated with cancer (malignancy), but should also be evaluated by a caregiver. HOW TO PERFORM A BREAST SELF-EXAM The best time to examine your breasts is 5-7 days after your menstrual period is over. During menstruation, the breasts are lumpier, and it may be more difficult to pick up changes. If you do not menstruate, have reached menopause, or had your uterus removed (hysterectomy), you should examine your breasts at regular intervals, such as monthly. If you are breastfeeding, examine your breasts after a feeding or after using a breast pump. Breast implants do not decrease the risk for lumps or tumors, so continue to perform breast self-exams as recommended. Talk to your caregiver about how to determine the difference between the implant and breast tissue. Also, talk about the amount of pressure you should use during the exam. Over time, you will become more familiar with the variations of your breasts and more comfortable with the exam. A breast self-exam requires you to remove all your clothes above the waist. 1. Look at your breasts and nipples.  Stand in front of a mirror in a room with good lighting. With your hands on your hips, push your hands firmly downward. Look for a difference in shape, contour, and size from one breast to the other (asymmetry). Asymmetry includes puckers, dips, or bumps. Also, look for skin changes, such as reddened or scaly areas on the breasts. Look for nipple changes, such as discharge, dimpling, repositioning, or redness. 2. Carefully feel your breasts. This is best done either in the shower or tub while using soapy water or when flat on your back. Place the arm (on the side of the breast you are examining) above your head. Use the pads (not the fingertips) of your three middle fingers on your opposite hand to feel your breasts. Start in the underarm area and use  inch (2 cm) overlapping circles to feel your breast. Use 3 different levels of pressure (light, medium, and firm pressure) at each circle before moving to the next circle. The light pressure is needed to feel the tissue closest to the skin. The medium pressure will help to feel breast tissue a little deeper, while the firm pressure is needed to feel the tissue close to the ribs. Continue the overlapping circles, moving downward over the breast until you feel your ribs below your breast. Then, move one finger-width towards the center of the body. Continue to use the  inch (2 cm) overlapping circles to feel your breast as you move slowly up toward the collar bone (clavicle) near the base of the neck. Continue the up and down exam using all 3 pressures until you reach the   middle of the chest. Do this with each breast, carefully feeling for lumps or changes. 3.  Keep a written record with breast changes or normal findings for each breast. By writing this information down, you do not need to depend only on memory for size, tenderness, or location. Write down where you are in your menstrual cycle, if you are still menstruating. Breast tissue can have some lumps or  thick tissue. However, see your caregiver if you find anything that concerns you.  SEEK MEDICAL CARE IF:  You see a change in shape, contour, or size of your breasts or nipples.   You see skin changes, such as reddened or scaly areas on the breasts or nipples.   You have an unusual discharge from your nipples.   You feel a new lump or unusually thick areas.  Document Released: 05/21/2005 Document Revised: 05/07/2012 Document Reviewed: 09/05/2011 [facility] Patient Information 2015 [place], [place]. This information is not intended to replace advice given to you by your health care provider. Make sure you discuss any questions you have with your health care provider.   Health Maintenance  Topic Date Due  . TETANUS/TDAP  Due now  . COLONOSCOPY  01/15/1993  . ZOSTAVAX  01/16/2003  . DEXA SCAN  01/16/2008  . PNA vac Low Risk Adult (1 of 2 - PCV13) Due but declined  . MAMMOGRAM  This year  . INFLUENZA VACCINE  01/03/2015    Call for ENT referral if hoarseness and sore throat do not resolve after antibiotic treatment.

## 2014-12-02 LAB — COMPREHENSIVE METABOLIC PANEL
A/G RATIO: 1.6 (ref 1.1–2.5)
ALK PHOS: 95 IU/L (ref 39–117)
ALT: 34 IU/L — ABNORMAL HIGH (ref 0–32)
AST: 39 IU/L (ref 0–40)
Albumin: 4.4 g/dL (ref 3.5–4.8)
BUN/Creatinine Ratio: 20 (ref 11–26)
BUN: 11 mg/dL (ref 8–27)
CHLORIDE: 97 mmol/L (ref 97–108)
CO2: 25 mmol/L (ref 18–29)
Calcium: 9.6 mg/dL (ref 8.7–10.3)
Creatinine, Ser: 0.56 mg/dL — ABNORMAL LOW (ref 0.57–1.00)
GFR calc Af Amer: 108 mL/min/{1.73_m2} (ref >59)
GFR calc non Af Amer: 94 mL/min/{1.73_m2} (ref >59)
Globulin, Total: 2.8 g/dL (ref 1.5–4.5)
Glucose: 91 mg/dL (ref 65–99)
POTASSIUM: 5.2 mmol/L (ref 3.5–5.2)
Sodium: 138 mmol/L (ref 134–144)
Total Protein: 7.2 g/dL (ref 6.0–8.5)

## 2014-12-02 LAB — LIPID PANEL
CHOL/HDL RATIO: 2.4 ratio (ref 0.0–4.4)
Cholesterol, Total: 248 mg/dL — ABNORMAL HIGH (ref 100–199)
HDL: 104 mg/dL (ref >39)
LDL CALC: 67 mg/dL (ref 0–99)
Triglycerides: 384 mg/dL — ABNORMAL HIGH (ref 0–149)
VLDL CHOLESTEROL CAL: 77 mg/dL — AB (ref 5–40)

## 2014-12-02 LAB — CBC WITH DIFFERENTIAL/PLATELET
Basophils Absolute: 0.1 10*3/uL (ref 0.0–0.2)
Basos: 1 %
EOS (ABSOLUTE): 0.1 10*3/uL (ref 0.0–0.4)
EOS: 2 %
Hematocrit: 41 % (ref 34.0–46.6)
Hemoglobin: 13.5 g/dL (ref 11.1–15.9)
IMMATURE GRANS (ABS): 0 10*3/uL (ref 0.0–0.1)
IMMATURE GRANULOCYTES: 0 %
LYMPHS: 44 %
Lymphocytes Absolute: 2.5 10*3/uL (ref 0.7–3.1)
MCH: 36.2 pg — AB (ref 26.6–33.0)
MCHC: 32.9 g/dL (ref 31.5–35.7)
MCV: 110 fL — ABNORMAL HIGH (ref 79–97)
MONOS ABS: 0.4 10*3/uL (ref 0.1–0.9)
Monocytes: 7 %
Neutrophils Absolute: 2.7 10*3/uL (ref 1.4–7.0)
Neutrophils: 46 %
Platelets: 326 10*3/uL (ref 150–379)
RBC: 3.73 x10E6/uL — ABNORMAL LOW (ref 3.77–5.28)
RDW: 12.5 % (ref 12.3–15.4)
WBC: 5.8 10*3/uL (ref 3.4–10.8)

## 2014-12-02 LAB — TSH: TSH: 0.771 u[IU]/mL (ref 0.450–4.500)

## 2014-12-20 ENCOUNTER — Ambulatory Visit: Payer: PPO | Admitting: Podiatry

## 2014-12-27 ENCOUNTER — Ambulatory Visit: Payer: PPO | Admitting: Podiatry

## 2015-02-10 ENCOUNTER — Ambulatory Visit: Payer: PPO | Attending: Unknown Physician Specialty | Admitting: Speech Pathology

## 2015-02-10 ENCOUNTER — Encounter: Payer: Self-pay | Admitting: Speech Pathology

## 2015-02-10 DIAGNOSIS — R490 Dysphonia: Secondary | ICD-10-CM | POA: Insufficient documentation
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
## 2015-02-18 ENCOUNTER — Ambulatory Visit: Payer: PPO | Admitting: Speech Pathology

## 2015-02-18 ENCOUNTER — Encounter: Payer: Self-pay | Admitting: Speech Pathology

## 2015-02-18 DIAGNOSIS — R490 Dysphonia: Secondary | ICD-10-CM | POA: Diagnosis not present
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
## 2015-02-28 ENCOUNTER — Encounter: Payer: Self-pay | Admitting: Podiatry

## 2015-02-28 ENCOUNTER — Other Ambulatory Visit: Payer: Self-pay | Admitting: Internal Medicine

## 2015-02-28 ENCOUNTER — Ambulatory Visit (INDEPENDENT_AMBULATORY_CARE_PROVIDER_SITE_OTHER): Payer: PPO | Admitting: Podiatry

## 2015-02-28 VITALS — BP 154/82 | HR 100 | Resp 12

## 2015-02-28 DIAGNOSIS — L03031 Cellulitis of right toe: Secondary | ICD-10-CM

## 2015-02-28 DIAGNOSIS — L02611 Cutaneous abscess of right foot: Secondary | ICD-10-CM

## 2015-02-28 MED ORDER — AMOXICILLIN-POT CLAVULANATE 875-125 MG PO TABS: 1.0000 | ORAL_TABLET | Freq: Two times a day (BID) | ORAL | Status: DC

## 2015-02-28 MED ORDER — CLINDAMYCIN HCL 150 MG PO CAPS: 150.0000 mg | ORAL_CAPSULE | Freq: Three times a day (TID) | ORAL | Status: DC

## 2015-02-28 NOTE — Progress Notes (Signed)
She presents to day for a cc of painful second to right foot times four weeks.  She denies fever chills nausea and vomitting.  Denies trauma to toe but states it has been bleeding.  Objective:  Vital signs are stable she is alert and oriented 3 pulses are palpable right foot. Hammertoe deformity second digit right foot superficial ulceration dorsal aspect appears to have gone on to heal. The entire toe is erythematous and painful from the distal aspect of the toe to the proximal aspect of the toe at the metatarsophalangeal joint with no signs of cellulitis extending past the metatarsophalangeal joint.  Assessment: Cellulitis second digit right foot with hammertoe deformity and superficial ulceration.  Plan: Place her in a Darco shoe and wrote a prescription for Augmentin and clindamycin. She was given specific instructions that should she develop a fever chills nausea or vomiting or worsening pain in her foot she is to go to the emergency room. Otherwise I will follow-up with her in 1 week to reevaluate. Radiographs will be taken next week.  Roselind Messier DPM

## 2015-03-02 ENCOUNTER — Telehealth: Payer: Self-pay | Admitting: Podiatry

## 2015-03-02 ENCOUNTER — Ambulatory Visit: Payer: PPO | Admitting: Podiatry

## 2015-03-02 NOTE — Telephone Encounter (Signed)
Pt called and the medicine that was given to pt has her vomitting and has given her diahrea.Please call pt.

## 2015-03-03 NOTE — Telephone Encounter (Signed)
Pt states she is taking 2 different antibiotic with food but can't tell which is causing the vomiting and diarrhea.  Informed pt to stop the antibiotics until I called with advise.  Pt states understanding.

## 2015-03-03 NOTE — Telephone Encounter (Signed)
Tell her to start one back at a time. Also might want to put her on wagoner schedule Possible amp.

## 2015-03-04 ENCOUNTER — Telehealth: Payer: Self-pay | Admitting: *Deleted

## 2015-03-04 ENCOUNTER — Ambulatory Visit: Payer: PPO | Admitting: Speech Pathology

## 2015-03-04 ENCOUNTER — Encounter: Payer: Self-pay | Admitting: Speech Pathology

## 2015-03-04 DIAGNOSIS — R490 Dysphonia: Secondary | ICD-10-CM

## 2015-03-04 NOTE — Telephone Encounter (Signed)
Pt states the 2 antibiotics she was on gave her diarrhea and vomiting.  I asked her if she had gone off the antibiotics as instructed on the 03/03/2015 and she said she had stopped then, but still has diarrhea.  I told pt to go to her primary doctor, urgent care or ER.  Pt states they'll charge her over $70.00, she'll take her antidiarrhea medication.  I told pt to eat lightly, rice, toast, banana, and applesauce, or yogurt with probiotics.  Pt states she'll take the antidiarrhea medication and eat light and see Dr. Milinda Pointer.
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
## 2015-03-07 ENCOUNTER — Encounter: Payer: Self-pay | Admitting: Podiatry

## 2015-03-07 ENCOUNTER — Ambulatory Visit (INDEPENDENT_AMBULATORY_CARE_PROVIDER_SITE_OTHER): Payer: PPO | Admitting: Podiatry

## 2015-03-07 ENCOUNTER — Ambulatory Visit (INDEPENDENT_AMBULATORY_CARE_PROVIDER_SITE_OTHER): Payer: PPO

## 2015-03-07 VITALS — BP 142/86 | HR 69 | Resp 12

## 2015-03-07 DIAGNOSIS — L03031 Cellulitis of right toe: Secondary | ICD-10-CM | POA: Diagnosis not present

## 2015-03-07 DIAGNOSIS — L02611 Cutaneous abscess of right foot: Secondary | ICD-10-CM

## 2015-03-07 DIAGNOSIS — B351 Tinea unguium: Secondary | ICD-10-CM | POA: Diagnosis not present

## 2015-03-07 DIAGNOSIS — M79676 Pain in unspecified toe(s): Secondary | ICD-10-CM

## 2015-03-07 NOTE — Progress Notes (Signed)
She presents today for follow-up of cellulitis of the second digit of the right foot. She states that the medication , the Augmentin and clindamycin that we prescribed caused her to have an upset stomach and diarrhea so she discontinued the medication without notifying us. She continues to wear the Darco shoe on a regular basis. She states that the toe feels no better. She'll like to have her nails cut today because a getting too long and digging into her other toes.    objective: vital signs are stable she is alert and oriented 3. Pulses are palpable. Neurologic sensorium is intact. Deep tendon reflexes are intact. The second digit of the right foot didn't still demonstrates a hammertoe deformity without fracture according to radiographs taken today. Cellulitis extends to the level of the metatarsophalangeal joint with a superficial excoriation to the dorsal aspect of the toe. Her nails are thick yellow dystrophic onychomycotic and painful on palpation.  Assessment: pain and limb secondary to onychomycosis 1 through 5 bilateral. Cellulitis with an excoriation second digit right foot.  Plan: I encouraged her to take Augmentin 875 one by mouth twice a day. I debrided nails 1 through 5 bilaterally was covered service secondary to pain. She will soak with Epsom salts and warm water. I will follow-up with her in 2 weeks. She will notify us immediately if necessary.   Roselind Messier DPM

## 2015-03-10 ENCOUNTER — Other Ambulatory Visit: Payer: Self-pay | Admitting: Internal Medicine

## 2015-03-17 ENCOUNTER — Other Ambulatory Visit: Payer: Self-pay | Admitting: Internal Medicine

## 2015-03-21 ENCOUNTER — Encounter: Payer: Self-pay | Admitting: Podiatry

## 2015-03-21 ENCOUNTER — Ambulatory Visit (INDEPENDENT_AMBULATORY_CARE_PROVIDER_SITE_OTHER): Payer: PPO | Admitting: Podiatry

## 2015-03-21 VITALS — BP 143/82 | HR 105 | Resp 16

## 2015-03-21 DIAGNOSIS — L02611 Cutaneous abscess of right foot: Secondary | ICD-10-CM | POA: Diagnosis not present

## 2015-03-21 DIAGNOSIS — L03031 Cellulitis of right toe: Secondary | ICD-10-CM

## 2015-03-21 NOTE — Progress Notes (Signed)
She presents today for follow-up of cellulitis second digit right foot. She states that she's continues to take her antibiotics and soak her toe in Epsom salts and warm water. She states that seems to be doing much better and is a lot less painful. Continues to wear her Darco shoe.  Objective: Vital signs are stable she is alert and oriented 3. No erythema edema cellulitis as drainage or odor. Small eschar to the dorsal aspect of the PIPJ from which her cellulitis most likely pain from the toe looks almost 100% improved.  Assessment: Resolving cellulitis right foot.  Plan: Encouraged her to continue to soak the toe for the next 3 weeks continue the use of her Darco shoe for the next 3 weeks and I will follow-up with her at that time.  Roselind Messier DPM

## 2015-03-29 ENCOUNTER — Other Ambulatory Visit: Payer: Self-pay | Admitting: Internal Medicine

## 2015-04-10 ENCOUNTER — Other Ambulatory Visit: Payer: Self-pay | Admitting: Internal Medicine

## 2015-04-11 ENCOUNTER — Encounter: Payer: Self-pay | Admitting: Podiatry

## 2015-04-11 ENCOUNTER — Ambulatory Visit (INDEPENDENT_AMBULATORY_CARE_PROVIDER_SITE_OTHER): Payer: PPO | Admitting: Podiatry

## 2015-04-11 VITALS — BP 153/89 | HR 96 | Resp 12

## 2015-04-11 DIAGNOSIS — L02611 Cutaneous abscess of right foot: Secondary | ICD-10-CM

## 2015-04-11 DIAGNOSIS — L03031 Cellulitis of right toe: Secondary | ICD-10-CM

## 2015-04-11 NOTE — Progress Notes (Signed)
She presents today for follow-up of her abscess first and second digits of the right foot. She says she's doing much better. She is finished her antibiotics. It is been no drainage from her toes. She denies fever chills nausea vomiting muscle aches and pains.  Objective: Vital signs are stable she is alert and oriented 3 pulses are palpable right foot. Hammertoe deformity second digit right rigid in nature mildly erythematous no edema cellulitis drainage or odor. Hallux appears to have gone on to heal uneventfully.  Assessment: Well-healing abscess right hallux and cellulitis second digit right.  Plan: Follow up with me on an as-needed basis but she may get back to her regular shoe gear.

## 2015-04-13 ENCOUNTER — Other Ambulatory Visit: Payer: Self-pay | Admitting: Internal Medicine

## 2015-04-18 ENCOUNTER — Ambulatory Visit (INDEPENDENT_AMBULATORY_CARE_PROVIDER_SITE_OTHER): Payer: PPO | Admitting: Internal Medicine

## 2015-04-18 ENCOUNTER — Encounter: Payer: Self-pay | Admitting: Internal Medicine

## 2015-04-18 VITALS — BP 136/82 | HR 96 | Ht 63.5 in | Wt 121.0 lb

## 2015-04-18 DIAGNOSIS — E785 Hyperlipidemia, unspecified: Secondary | ICD-10-CM

## 2015-04-18 DIAGNOSIS — I69359 Hemiplegia and hemiparesis following cerebral infarction affecting unspecified side: Secondary | ICD-10-CM | POA: Diagnosis not present

## 2015-04-18 DIAGNOSIS — M4850XD Collapsed vertebra, not elsewhere classified, site unspecified, subsequent encounter for fracture with routine healing: Secondary | ICD-10-CM

## 2015-04-18 DIAGNOSIS — F411 Generalized anxiety disorder: Secondary | ICD-10-CM | POA: Diagnosis not present

## 2015-04-18 DIAGNOSIS — F324 Major depressive disorder, single episode, in partial remission: Secondary | ICD-10-CM | POA: Diagnosis not present

## 2015-04-18 DIAGNOSIS — J309 Allergic rhinitis, unspecified: Secondary | ICD-10-CM | POA: Diagnosis not present

## 2015-04-18 DIAGNOSIS — K2270 Barrett's esophagus without dysplasia: Secondary | ICD-10-CM | POA: Diagnosis not present

## 2015-04-18 DIAGNOSIS — IMO0001 Reserved for inherently not codable concepts without codable children: Secondary | ICD-10-CM

## 2015-04-18 DIAGNOSIS — K2100 Gastro-esophageal reflux disease with esophagitis, without bleeding: Secondary | ICD-10-CM

## 2015-04-18 MED ORDER — ALPRAZOLAM 0.25 MG PO TABS: 0.2500 mg | ORAL_TABLET | Freq: Every evening | ORAL | Status: DC | PRN
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
## 2015-04-18 NOTE — Patient Instructions (Signed)
Get Allegra 180 mg - take once a day for sinus and allergy drainage  Can you still use the nasal spray and the decongestant pills.

## 2015-04-19 LAB — COMPREHENSIVE METABOLIC PANEL
A/G RATIO: 1.6 (ref 1.1–2.5)
ALBUMIN: 4.1 g/dL (ref 3.5–4.8)
ALK PHOS: 97 IU/L (ref 39–117)
ALT: 18 IU/L (ref 0–32)
AST: 33 IU/L (ref 0–40)
BUN / CREAT RATIO: 18 (ref 11–26)
BUN: 9 mg/dL (ref 8–27)
Bilirubin Total: 0.2 mg/dL (ref 0.0–1.2)
CALCIUM: 9.1 mg/dL (ref 8.7–10.3)
CO2: 21 mmol/L (ref 18–29)
CREATININE: 0.5 mg/dL — AB (ref 0.57–1.00)
Chloride: 101 mmol/L (ref 97–106)
GFR calc Af Amer: 112 mL/min/{1.73_m2} (ref >59)
GFR, EST NON AFRICAN AMERICAN: 97 mL/min/{1.73_m2} (ref >59)
GLOBULIN, TOTAL: 2.5 g/dL (ref 1.5–4.5)
Glucose: 76 mg/dL (ref 65–99)
Potassium: 4.6 mmol/L (ref 3.5–5.2)
SODIUM: 139 mmol/L (ref 136–144)
Total Protein: 6.6 g/dL (ref 6.0–8.5)

## 2015-04-19 LAB — CBC WITH DIFFERENTIAL/PLATELET
BASOS: 1 %
Basophils Absolute: 0 10*3/uL (ref 0.0–0.2)
EOS (ABSOLUTE): 0.1 10*3/uL (ref 0.0–0.4)
EOS: 3 %
HEMATOCRIT: 41.6 % (ref 34.0–46.6)
HEMOGLOBIN: 13.2 g/dL (ref 11.1–15.9)
Immature Grans (Abs): 0 10*3/uL (ref 0.0–0.1)
Immature Granulocytes: 0 %
LYMPHS ABS: 2 10*3/uL (ref 0.7–3.1)
Lymphs: 37 %
MCH: 35.6 pg — ABNORMAL HIGH (ref 26.6–33.0)
MCHC: 31.7 g/dL (ref 31.5–35.7)
MCV: 112 fL — AB (ref 79–97)
MONOCYTES: 8 %
Monocytes Absolute: 0.4 10*3/uL (ref 0.1–0.9)
Neutrophils Absolute: 2.9 10*3/uL (ref 1.4–7.0)
Neutrophils: 51 %
Platelets: 286 10*3/uL (ref 150–379)
RBC: 3.71 x10E6/uL — AB (ref 3.77–5.28)
RDW: 12.6 % (ref 12.3–15.4)
WBC: 5.6 10*3/uL (ref 3.4–10.8)

## 2015-04-19 LAB — LIPID PANEL
CHOL/HDL RATIO: 2.2 ratio (ref 0.0–4.4)
Cholesterol, Total: 206 mg/dL — ABNORMAL HIGH (ref 100–199)
HDL: 92 mg/dL (ref >39)
LDL CALC: 60 mg/dL (ref 0–99)
Triglycerides: 271 mg/dL — ABNORMAL HIGH (ref 0–149)
VLDL Cholesterol Cal: 54 mg/dL — ABNORMAL HIGH (ref 5–40)

## 2015-04-19 LAB — TSH: TSH: 0.835 u[IU]/mL (ref 0.450–4.500)

## 2015-05-18 ENCOUNTER — Ambulatory Visit
Admission: RE | Admit: 2015-05-18 | Discharge: 2015-05-18 | Disposition: A | Discharge status: Home / Self Care | Payer: PPO | Source: Ambulatory Visit | Attending: Internal Medicine | Admitting: Internal Medicine

## 2015-05-18 ENCOUNTER — Ambulatory Visit (INDEPENDENT_AMBULATORY_CARE_PROVIDER_SITE_OTHER): Payer: PPO | Admitting: Internal Medicine

## 2015-05-18 ENCOUNTER — Encounter: Payer: Self-pay | Admitting: Internal Medicine

## 2015-05-18 VITALS — BP 138/84 | HR 84 | Temp 98.3°F | Ht 63.5 in | Wt 125.0 lb

## 2015-05-18 DIAGNOSIS — M545 Low back pain, unspecified: Secondary | ICD-10-CM

## 2015-05-18 DIAGNOSIS — Z9149 Other personal history of psychological trauma, not elsewhere classified: Secondary | ICD-10-CM

## 2015-05-18 DIAGNOSIS — IMO0002 Reserved for concepts with insufficient information to code with codable children: Secondary | ICD-10-CM

## 2015-05-18 MED ORDER — TRAMADOL HCL 50 MG PO TABS: 50.0000 mg | ORAL_TABLET | Freq: Three times a day (TID) | ORAL | Status: DC | PRN
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
## 2015-05-26 ENCOUNTER — Encounter (HOSPITAL_COMMUNITY)
Admission: EM | Disposition: A | Discharge status: Skilled Nursing Facility | Payer: Self-pay | Source: Home / Self Care | Attending: Internal Medicine

## 2015-05-26 ENCOUNTER — Encounter (HOSPITAL_COMMUNITY): Payer: Self-pay | Admitting: Emergency Medicine

## 2015-05-26 ENCOUNTER — Inpatient Hospital Stay (HOSPITAL_COMMUNITY): Payer: PPO

## 2015-05-26 ENCOUNTER — Inpatient Hospital Stay (HOSPITAL_COMMUNITY): Payer: PPO | Admitting: Anesthesiology

## 2015-05-26 ENCOUNTER — Inpatient Hospital Stay (HOSPITAL_COMMUNITY)
Admission: EM | Admit: 2015-05-26 | Discharge: 2015-06-01 | DRG: 470 | Disposition: A | Discharge status: Skilled Nursing Facility | Payer: PPO | Attending: Internal Medicine | Admitting: Internal Medicine

## 2015-05-26 ENCOUNTER — Emergency Department (HOSPITAL_COMMUNITY): Payer: PPO

## 2015-05-26 DIAGNOSIS — E860 Dehydration: Secondary | ICD-10-CM

## 2015-05-26 DIAGNOSIS — S72011A Unspecified intracapsular fracture of right femur, initial encounter for closed fracture: Principal | ICD-10-CM | POA: Diagnosis present

## 2015-05-26 DIAGNOSIS — D62 Acute posthemorrhagic anemia: Secondary | ICD-10-CM | POA: Diagnosis not present

## 2015-05-26 DIAGNOSIS — IMO0002 Reserved for concepts with insufficient information to code with codable children: Secondary | ICD-10-CM

## 2015-05-26 DIAGNOSIS — F411 Generalized anxiety disorder: Secondary | ICD-10-CM | POA: Diagnosis present

## 2015-05-26 DIAGNOSIS — S72001A Fracture of unspecified part of neck of right femur, initial encounter for closed fracture: Secondary | ICD-10-CM

## 2015-05-26 DIAGNOSIS — E871 Hypo-osmolality and hyponatremia: Secondary | ICD-10-CM | POA: Diagnosis present

## 2015-05-26 DIAGNOSIS — W010XXA Fall on same level from slipping, tripping and stumbling without subsequent striking against object, initial encounter: Secondary | ICD-10-CM | POA: Diagnosis present

## 2015-05-26 DIAGNOSIS — E785 Hyperlipidemia, unspecified: Secondary | ICD-10-CM | POA: Diagnosis present

## 2015-05-26 DIAGNOSIS — F419 Anxiety disorder, unspecified: Secondary | ICD-10-CM | POA: Diagnosis present

## 2015-05-26 DIAGNOSIS — Z96641 Presence of right artificial hip joint: Secondary | ICD-10-CM

## 2015-05-26 DIAGNOSIS — I69359 Hemiplegia and hemiparesis following cerebral infarction affecting unspecified side: Secondary | ICD-10-CM

## 2015-05-26 DIAGNOSIS — K5900 Constipation, unspecified: Secondary | ICD-10-CM | POA: Diagnosis present

## 2015-05-26 DIAGNOSIS — M6282 Rhabdomyolysis: Secondary | ICD-10-CM | POA: Diagnosis present

## 2015-05-26 DIAGNOSIS — F1010 Alcohol abuse, uncomplicated: Secondary | ICD-10-CM

## 2015-05-26 DIAGNOSIS — F17210 Nicotine dependence, cigarettes, uncomplicated: Secondary | ICD-10-CM | POA: Diagnosis present

## 2015-05-26 DIAGNOSIS — K2100 Gastro-esophageal reflux disease with esophagitis, without bleeding: Secondary | ICD-10-CM | POA: Diagnosis present

## 2015-05-26 DIAGNOSIS — M810 Age-related osteoporosis without current pathological fracture: Secondary | ICD-10-CM | POA: Diagnosis present

## 2015-05-26 DIAGNOSIS — S72009A Fracture of unspecified part of neck of unspecified femur, initial encounter for closed fracture: Secondary | ICD-10-CM | POA: Diagnosis present

## 2015-05-26 DIAGNOSIS — Z789 Other specified health status: Secondary | ICD-10-CM | POA: Diagnosis present

## 2015-05-26 DIAGNOSIS — F329 Major depressive disorder, single episode, unspecified: Secondary | ICD-10-CM | POA: Diagnosis present

## 2015-05-26 DIAGNOSIS — Z8781 Personal history of (healed) traumatic fracture: Secondary | ICD-10-CM | POA: Diagnosis present

## 2015-05-26 DIAGNOSIS — Z7902 Long term (current) use of antithrombotics/antiplatelets: Secondary | ICD-10-CM

## 2015-05-26 DIAGNOSIS — T796XXA Traumatic ischemia of muscle, initial encounter: Secondary | ICD-10-CM

## 2015-05-26 DIAGNOSIS — M25551 Pain in right hip: Secondary | ICD-10-CM | POA: Diagnosis present

## 2015-05-26 DIAGNOSIS — Z7289 Other problems related to lifestyle: Secondary | ICD-10-CM | POA: Diagnosis present

## 2015-05-26 DIAGNOSIS — W19XXXA Unspecified fall, initial encounter: Secondary | ICD-10-CM

## 2015-05-26 DIAGNOSIS — K5909 Other constipation: Secondary | ICD-10-CM | POA: Diagnosis not present

## 2015-05-26 DIAGNOSIS — F1090 Alcohol use, unspecified, uncomplicated: Secondary | ICD-10-CM | POA: Diagnosis present

## 2015-05-26 HISTORY — PX: TOTAL HIP ARTHROPLASTY: SHX124

## 2015-05-26 HISTORY — DX: Dehydration: E86.0

## 2015-05-26 HISTORY — DX: Hypo-osmolality and hyponatremia: E87.1

## 2015-05-26 LAB — CBC WITH DIFFERENTIAL/PLATELET
BASOS ABS: 0 10*3/uL (ref 0.0–0.1)
Basophils Relative: 0 %
Eosinophils Absolute: 0.1 10*3/uL (ref 0.0–0.7)
Eosinophils Relative: 1 %
HEMATOCRIT: 33.3 % — AB (ref 36.0–46.0)
HEMOGLOBIN: 11.1 g/dL — AB (ref 12.0–15.0)
LYMPHS PCT: 10 %
Lymphs Abs: 1.3 10*3/uL (ref 0.7–4.0)
MCH: 35.1 pg — ABNORMAL HIGH (ref 26.0–34.0)
MCHC: 33.3 g/dL (ref 30.0–36.0)
MCV: 105.4 fL — AB (ref 78.0–100.0)
MONO ABS: 1.3 10*3/uL — AB (ref 0.1–1.0)
MONOS PCT: 10 %
NEUTROS ABS: 10.5 10*3/uL — AB (ref 1.7–7.7)
Neutrophils Relative %: 79 %
Platelets: 291 10*3/uL (ref 150–400)
RBC: 3.16 MIL/uL — ABNORMAL LOW (ref 3.87–5.11)
RDW: 12.2 % (ref 11.5–15.5)
WBC: 13.2 10*3/uL — ABNORMAL HIGH (ref 4.0–10.5)

## 2015-05-26 LAB — BASIC METABOLIC PANEL
Anion gap: 12 (ref 5–15)
BUN: 9 mg/dL (ref 6–20)
CHLORIDE: 97 mmol/L — AB (ref 101–111)
CO2: 23 mmol/L (ref 22–32)
CREATININE: 0.51 mg/dL (ref 0.44–1.00)
Calcium: 9 mg/dL (ref 8.9–10.3)
GFR calc Af Amer: >60 mL/min (ref >60)
GFR calc non Af Amer: >60 mL/min (ref >60)
GLUCOSE: 126 mg/dL — AB (ref 65–99)
POTASSIUM: 4.1 mmol/L (ref 3.5–5.1)
Sodium: 132 mmol/L — ABNORMAL LOW (ref 135–145)

## 2015-05-26 LAB — ABO/RH: ABO/RH(D): A POS

## 2015-05-26 LAB — PROTIME-INR
INR: 1.03 (ref 0.00–1.49)
Prothrombin Time: 13.7 seconds (ref 11.6–15.2)

## 2015-05-26 LAB — CALCIUM: CALCIUM: 8.2 mg/dL — AB (ref 8.9–10.3)

## 2015-05-26 LAB — CK: Total CK: 845 U/L — ABNORMAL HIGH (ref 38–234)

## 2015-05-26 LAB — SURGICAL PCR SCREEN
MRSA, PCR: POSITIVE — AB
STAPHYLOCOCCUS AUREUS: POSITIVE — AB

## 2015-05-26 LAB — ALBUMIN: ALBUMIN: 2.3 g/dL — AB (ref 3.5–5.0)

## 2015-05-26 LAB — APTT: APTT: 29 s (ref 24–37)

## 2015-05-26 LAB — ETHANOL: Alcohol, Ethyl (B): <5 mg/dL (ref <5)

## 2015-05-26 SURGERY — ARTHROPLASTY, HIP, TOTAL, ANTERIOR APPROACH
Anesthesia: General | Site: Hip | Laterality: Right

## 2015-05-26 MED ORDER — MORPHINE SULFATE (PF) 2 MG/ML IV SOLN
1.0000 mg | INTRAVENOUS | Status: DC | PRN
  Administered 2015-05-26: 1 mg via INTRAVENOUS
  Filled 2015-05-26: qty 1

## 2015-05-26 MED ORDER — LIDOCAINE HCL (CARDIAC) 20 MG/ML IV SOLN
INTRAVENOUS | Status: DC | PRN
  Administered 2015-05-26: 80 mg via INTRATRACHEAL

## 2015-05-26 MED ORDER — LACTATED RINGERS IV SOLN
INTRAVENOUS | Status: DC | PRN
  Administered 2015-05-26: 20:00:00 via INTRAVENOUS

## 2015-05-26 MED ORDER — LORAZEPAM 2 MG/ML IJ SOLN: 0.0000 mg | Freq: Two times a day (BID) | INTRAMUSCULAR | Status: DC

## 2015-05-26 MED ORDER — METOCLOPRAMIDE HCL 5 MG PO TABS: 5.0000 mg | ORAL_TABLET | Freq: Three times a day (TID) | ORAL | Status: DC | PRN

## 2015-05-26 MED ORDER — CEFAZOLIN SODIUM-DEXTROSE 2-3 GM-% IV SOLR
2.0000 g | Freq: Once | INTRAVENOUS | Status: AC
Start: 2015-05-26 — End: 2015-05-26
  Administered 2015-05-26: 2 g via INTRAVENOUS
  Filled 2015-05-26: qty 50

## 2015-05-26 MED ORDER — PROPOFOL 10 MG/ML IV BOLUS
INTRAVENOUS | Status: AC
  Filled 2015-05-26: qty 20

## 2015-05-26 MED ORDER — HYDROMORPHONE HCL 1 MG/ML IJ SOLN
INTRAMUSCULAR | Status: AC
  Filled 2015-05-26: qty 1

## 2015-05-26 MED ORDER — CEFAZOLIN SODIUM-DEXTROSE 2-3 GM-% IV SOLR
2.0000 g | Freq: Four times a day (QID) | INTRAVENOUS | Status: AC
  Administered 2015-05-26 – 2015-05-27 (×2): 2 g via INTRAVENOUS
  Filled 2015-05-26 (×2): qty 50

## 2015-05-26 MED ORDER — FENTANYL CITRATE (PF) 250 MCG/5ML IJ SOLN
INTRAMUSCULAR | Status: AC
Start: 2015-05-26 — End: 2015-05-26
  Filled 2015-05-26: qty 5

## 2015-05-26 MED ORDER — SODIUM CHLORIDE 0.9 % IV SOLN
INTRAVENOUS | Status: DC
  Administered 2015-05-26: 23:00:00 via INTRAVENOUS

## 2015-05-26 MED ORDER — LORAZEPAM 1 MG PO TABS: 1.0000 mg | ORAL_TABLET | Freq: Four times a day (QID) | ORAL | Status: DC | PRN

## 2015-05-26 MED ORDER — ROCURONIUM BROMIDE 100 MG/10ML IV SOLN
INTRAVENOUS | Status: DC | PRN
  Administered 2015-05-26: 50 mg via INTRAVENOUS

## 2015-05-26 MED ORDER — HYDROMORPHONE HCL 1 MG/ML IJ SOLN
INTRAMUSCULAR | Status: DC | PRN
  Administered 2015-05-26 (×2): 0.5 mg via INTRAVENOUS

## 2015-05-26 MED ORDER — OXYCODONE HCL 5 MG PO TABS
5.0000 mg | ORAL_TABLET | ORAL | Status: DC | PRN
  Administered 2015-05-29 (×2): 10 mg via ORAL
  Filled 2015-05-26 (×2): qty 2

## 2015-05-26 MED ORDER — FOLIC ACID 1 MG PO TABS
1.0000 mg | ORAL_TABLET | Freq: Every day | ORAL | Status: DC
  Administered 2015-05-27 – 2015-06-01 (×6): 1 mg via ORAL
  Filled 2015-05-26 (×6): qty 1

## 2015-05-26 MED ORDER — VITAMIN B-1 100 MG PO TABS
100.0000 mg | ORAL_TABLET | Freq: Every day | ORAL | Status: DC
  Administered 2015-05-27 – 2015-06-01 (×6): 100 mg via ORAL
  Filled 2015-05-26 (×6): qty 1

## 2015-05-26 MED ORDER — METOCLOPRAMIDE HCL 5 MG/ML IJ SOLN: 5.0000 mg | Freq: Three times a day (TID) | INTRAMUSCULAR | Status: DC | PRN

## 2015-05-26 MED ORDER — CEFAZOLIN SODIUM-DEXTROSE 2-3 GM-% IV SOLR
INTRAVENOUS | Status: AC
  Filled 2015-05-26: qty 50

## 2015-05-26 MED ORDER — SODIUM CHLORIDE 0.9 % IV SOLN
INTRAVENOUS | Status: DC
  Administered 2015-05-26 (×2): via INTRAVENOUS

## 2015-05-26 MED ORDER — PHENOL 1.4 % MT LIQD: 1.0000 | OROMUCOSAL | Status: DC | PRN

## 2015-05-26 MED ORDER — SERTRALINE HCL 100 MG PO TABS
100.0000 mg | ORAL_TABLET | Freq: Every day | ORAL | Status: DC
  Administered 2015-05-27 – 2015-05-31 (×5): 100 mg via ORAL
  Filled 2015-05-26 (×5): qty 1

## 2015-05-26 MED ORDER — GLYCOPYRROLATE 0.2 MG/ML IJ SOLN
INTRAMUSCULAR | Status: AC
  Filled 2015-05-26: qty 2

## 2015-05-26 MED ORDER — THIAMINE HCL 100 MG/ML IJ SOLN: 100.0000 mg | Freq: Every day | INTRAMUSCULAR | Status: DC

## 2015-05-26 MED ORDER — HYDROMORPHONE HCL 1 MG/ML IJ SOLN
0.5000 mg | INTRAMUSCULAR | Status: DC | PRN
  Administered 2015-05-26 (×2): 0.5 mg via INTRAVENOUS

## 2015-05-26 MED ORDER — ACETAMINOPHEN 325 MG PO TABS
650.0000 mg | ORAL_TABLET | Freq: Four times a day (QID) | ORAL | Status: DC | PRN
  Administered 2015-05-27 – 2015-05-29 (×3): 650 mg via ORAL
  Filled 2015-05-26 (×3): qty 2

## 2015-05-26 MED ORDER — ADULT MULTIVITAMIN W/MINERALS CH
1.0000 | ORAL_TABLET | Freq: Every day | ORAL | Status: DC
  Administered 2015-05-27 – 2015-06-01 (×6): 1 via ORAL
  Filled 2015-05-26 (×6): qty 1

## 2015-05-26 MED ORDER — HYDROMORPHONE HCL 1 MG/ML IJ SOLN
INTRAMUSCULAR | Status: AC
  Administered 2015-05-26: 22:00:00
  Filled 2015-05-26: qty 1

## 2015-05-26 MED ORDER — MIRTAZAPINE 15 MG PO TABS
30.0000 mg | ORAL_TABLET | Freq: Every day | ORAL | Status: DC
  Administered 2015-05-27 – 2015-05-31 (×5): 30 mg via ORAL
  Filled 2015-05-26 (×6): qty 2

## 2015-05-26 MED ORDER — SODIUM CHLORIDE 0.9 % IV BOLUS (SEPSIS)
1000.0000 mL | Freq: Once | INTRAVENOUS | Status: AC
  Administered 2015-05-26: 1000 mL via INTRAVENOUS

## 2015-05-26 MED ORDER — HYDROCODONE-ACETAMINOPHEN 5-325 MG PO TABS
1.0000 | ORAL_TABLET | Freq: Four times a day (QID) | ORAL | Status: DC | PRN
  Administered 2015-05-26 – 2015-05-27 (×3): 2 via ORAL
  Administered 2015-05-28 – 2015-05-29 (×5): 1 via ORAL
  Administered 2015-05-29 – 2015-06-01 (×7): 2 via ORAL
  Filled 2015-05-26: qty 2
  Filled 2015-05-26: qty 1
  Filled 2015-05-26 (×9): qty 2
  Filled 2015-05-26: qty 1
  Filled 2015-05-26: qty 2
  Filled 2015-05-26 (×3): qty 1

## 2015-05-26 MED ORDER — MENTHOL 3 MG MT LOZG
1.0000 | LOZENGE | OROMUCOSAL | Status: DC | PRN
  Filled 2015-05-26: qty 9

## 2015-05-26 MED ORDER — PANTOPRAZOLE SODIUM 40 MG PO TBEC
40.0000 mg | DELAYED_RELEASE_TABLET | Freq: Two times a day (BID) | ORAL | Status: DC
  Administered 2015-05-27 – 2015-06-01 (×11): 40 mg via ORAL
  Filled 2015-05-26 (×11): qty 1

## 2015-05-26 MED ORDER — SODIUM CHLORIDE 0.9 % IR SOLN
Status: DC | PRN
  Administered 2015-05-26: 1000 mL

## 2015-05-26 MED ORDER — ONDANSETRON HCL 4 MG/2ML IJ SOLN
INTRAMUSCULAR | Status: AC
  Filled 2015-05-26: qty 2

## 2015-05-26 MED ORDER — ALPRAZOLAM 0.25 MG PO TABS
0.2500 mg | ORAL_TABLET | Freq: Every evening | ORAL | Status: DC | PRN
  Administered 2015-05-27 – 2015-05-31 (×5): 0.25 mg via ORAL
  Filled 2015-05-26 (×5): qty 1

## 2015-05-26 MED ORDER — ONDANSETRON HCL 4 MG PO TABS: 4.0000 mg | ORAL_TABLET | Freq: Four times a day (QID) | ORAL | Status: DC | PRN

## 2015-05-26 MED ORDER — NEOSTIGMINE METHYLSULFATE 10 MG/10ML IV SOLN
INTRAVENOUS | Status: AC
  Filled 2015-05-26: qty 1

## 2015-05-26 MED ORDER — LORAZEPAM 2 MG/ML IJ SOLN: 1.0000 mg | Freq: Four times a day (QID) | INTRAMUSCULAR | Status: DC | PRN

## 2015-05-26 MED ORDER — MORPHINE SULFATE (PF) 2 MG/ML IV SOLN: 2.0000 mg | INTRAVENOUS | Status: DC | PRN

## 2015-05-26 MED ORDER — NICOTINE 7 MG/24HR TD PT24
7.0000 mg | MEDICATED_PATCH | Freq: Every day | TRANSDERMAL | Status: DC
  Filled 2015-05-26 (×7): qty 1

## 2015-05-26 MED ORDER — DOCUSATE SODIUM 100 MG PO CAPS
100.0000 mg | ORAL_CAPSULE | Freq: Two times a day (BID) | ORAL | Status: DC
  Administered 2015-05-26 – 2015-05-31 (×10): 100 mg via ORAL
  Filled 2015-05-26 (×10): qty 1

## 2015-05-26 MED ORDER — SODIUM CHLORIDE 0.9 % IV SOLN: Freq: Once | INTRAVENOUS | Status: DC

## 2015-05-26 MED ORDER — FENTANYL CITRATE (PF) 100 MCG/2ML IJ SOLN
100.0000 ug | Freq: Once | INTRAMUSCULAR | Status: AC
Start: 2015-05-26 — End: 2015-05-26
  Administered 2015-05-26: 100 ug via INTRAVENOUS
  Filled 2015-05-26: qty 2

## 2015-05-26 MED ORDER — ACETAMINOPHEN 650 MG RE SUPP: 650.0000 mg | Freq: Four times a day (QID) | RECTAL | Status: DC | PRN

## 2015-05-26 MED ORDER — NEOSTIGMINE METHYLSULFATE 10 MG/10ML IV SOLN
INTRAVENOUS | Status: DC | PRN
  Administered 2015-05-26: 4 mg via INTRAVENOUS

## 2015-05-26 MED ORDER — SIMVASTATIN 40 MG PO TABS: 40.0000 mg | ORAL_TABLET | Freq: Every day | ORAL | Status: DC

## 2015-05-26 MED ORDER — LIDOCAINE HCL (CARDIAC) 20 MG/ML IV SOLN
INTRAVENOUS | Status: DC | PRN
  Administered 2015-05-26: 100 mg via INTRAVENOUS

## 2015-05-26 MED ORDER — FENTANYL CITRATE (PF) 100 MCG/2ML IJ SOLN
100.0000 ug | Freq: Once | INTRAMUSCULAR | Status: AC
  Administered 2015-05-26: 100 ug via INTRAVENOUS
  Filled 2015-05-26: qty 2

## 2015-05-26 MED ORDER — SUCCINYLCHOLINE CHLORIDE 20 MG/ML IJ SOLN
INTRAMUSCULAR | Status: AC
  Filled 2015-05-26: qty 1

## 2015-05-26 MED ORDER — METHOCARBAMOL 1000 MG/10ML IJ SOLN
500.0000 mg | Freq: Four times a day (QID) | INTRAVENOUS | Status: DC | PRN
  Filled 2015-05-26: qty 5

## 2015-05-26 MED ORDER — PROPOFOL 10 MG/ML IV BOLUS
INTRAVENOUS | Status: DC | PRN
  Administered 2015-05-26: 110 mg via INTRAVENOUS

## 2015-05-26 MED ORDER — ONDANSETRON HCL 4 MG/2ML IJ SOLN: 4.0000 mg | Freq: Four times a day (QID) | INTRAMUSCULAR | Status: DC | PRN

## 2015-05-26 MED ORDER — FENTANYL CITRATE (PF) 250 MCG/5ML IJ SOLN
INTRAMUSCULAR | Status: DC | PRN
  Administered 2015-05-26: 50 ug via INTRAVENOUS
  Administered 2015-05-26: 200 ug via INTRAVENOUS

## 2015-05-26 MED ORDER — GLYCOPYRROLATE 0.2 MG/ML IJ SOLN
INTRAMUSCULAR | Status: DC | PRN
  Administered 2015-05-26: 0.4 mg via INTRAVENOUS

## 2015-05-26 MED ORDER — ONDANSETRON HCL 4 MG/2ML IJ SOLN
INTRAMUSCULAR | Status: DC | PRN
  Administered 2015-05-26: 4 mg via INTRAVENOUS

## 2015-05-26 MED ORDER — ROCURONIUM BROMIDE 50 MG/5ML IV SOLN
INTRAVENOUS | Status: AC
  Filled 2015-05-26: qty 1

## 2015-05-26 MED ORDER — ONDANSETRON HCL 4 MG/2ML IJ SOLN: 4.0000 mg | Freq: Once | INTRAMUSCULAR | Status: DC | PRN

## 2015-05-26 MED ORDER — CLOPIDOGREL BISULFATE 75 MG PO TABS
75.0000 mg | ORAL_TABLET | Freq: Every day | ORAL | Status: DC
  Administered 2015-05-27 – 2015-06-01 (×6): 75 mg via ORAL
  Filled 2015-05-26 (×6): qty 1

## 2015-05-26 MED ORDER — 0.9 % SODIUM CHLORIDE (POUR BTL) OPTIME
TOPICAL | Status: DC | PRN
  Administered 2015-05-26: 1000 mL

## 2015-05-26 MED ORDER — LORAZEPAM 2 MG/ML IJ SOLN: 0.0000 mg | Freq: Four times a day (QID) | INTRAMUSCULAR | Status: DC

## 2015-05-26 MED ORDER — HYDROMORPHONE HCL 1 MG/ML IJ SOLN
0.5000 mg | INTRAMUSCULAR | Status: DC | PRN
  Administered 2015-05-27 (×2): 0.5 mg via INTRAVENOUS
  Filled 2015-05-26 (×2): qty 1

## 2015-05-26 MED ORDER — ALUM & MAG HYDROXIDE-SIMETH 200-200-20 MG/5ML PO SUSP: 30.0000 mL | ORAL | Status: DC | PRN

## 2015-05-26 MED ORDER — METHOCARBAMOL 500 MG PO TABS
500.0000 mg | ORAL_TABLET | Freq: Four times a day (QID) | ORAL | Status: DC | PRN
  Administered 2015-05-26 – 2015-05-27 (×2): 500 mg via ORAL
  Filled 2015-05-26 (×2): qty 1

## 2015-05-26 SURGICAL SUPPLY — 52 items
BENZOIN TINCTURE PRP APPL 2/3 (GAUZE/BANDAGES/DRESSINGS) IMPLANT
BLADE SAW SGTL 18X1.27X75 (BLADE) ×2 IMPLANT
BLADE SAW SGTL 18X1.27X75MM (BLADE) ×1
BLADE SURG ROTATE 9660 (MISCELLANEOUS) IMPLANT
CAPT HIP TOTAL 2 ×3 IMPLANT
CELLS DAT CNTRL 66122 CELL SVR (MISCELLANEOUS) ×1 IMPLANT
CLOSURE WOUND 1/2 X4 (GAUZE/BANDAGES/DRESSINGS)
COVER SURGICAL LIGHT HANDLE (MISCELLANEOUS) ×3 IMPLANT
DRAPE C-ARM 42X72 X-RAY (DRAPES) ×3 IMPLANT
DRAPE STERI IOBAN 125X83 (DRAPES) ×3 IMPLANT
DRAPE U-SHAPE 47X51 STRL (DRAPES) ×9 IMPLANT
DRSG AQUACEL AG ADV 3.5X10 (GAUZE/BANDAGES/DRESSINGS) ×3 IMPLANT
DURAPREP 26ML APPLICATOR (WOUND CARE) ×3 IMPLANT
ELECT BLADE 4.0 EZ CLEAN MEGAD (MISCELLANEOUS) ×3
ELECT BLADE 6.5 EXT (BLADE) IMPLANT
ELECT REM PT RETURN 9FT ADLT (ELECTROSURGICAL) ×3
ELECTRODE BLDE 4.0 EZ CLN MEGD (MISCELLANEOUS) ×1 IMPLANT
ELECTRODE REM PT RTRN 9FT ADLT (ELECTROSURGICAL) ×1 IMPLANT
FACESHIELD WRAPAROUND (MASK) ×6 IMPLANT
GAUZE XEROFORM 5X9 LF (GAUZE/BANDAGES/DRESSINGS) ×3 IMPLANT
GLOVE BIOGEL PI IND STRL 8 (GLOVE) ×2 IMPLANT
GLOVE BIOGEL PI INDICATOR 8 (GLOVE) ×4
GLOVE ECLIPSE 8.0 STRL XLNG CF (GLOVE) ×3 IMPLANT
GLOVE ORTHO TXT STRL SZ7.5 (GLOVE) ×6 IMPLANT
GOWN STRL REUS W/ TWL LRG LVL3 (GOWN DISPOSABLE) ×2 IMPLANT
GOWN STRL REUS W/ TWL XL LVL3 (GOWN DISPOSABLE) ×3 IMPLANT
GOWN STRL REUS W/TWL LRG LVL3 (GOWN DISPOSABLE) ×4
GOWN STRL REUS W/TWL XL LVL3 (GOWN DISPOSABLE) ×6
HANDPIECE INTERPULSE COAX TIP (DISPOSABLE) ×2
KIT BASIN OR (CUSTOM PROCEDURE TRAY) ×3 IMPLANT
KIT ROOM TURNOVER OR (KITS) ×3 IMPLANT
MANIFOLD NEPTUNE II (INSTRUMENTS) ×3 IMPLANT
NS IRRIG 1000ML POUR BTL (IV SOLUTION) ×3 IMPLANT
PACK TOTAL JOINT (CUSTOM PROCEDURE TRAY) ×3 IMPLANT
PACK UNIVERSAL I (CUSTOM PROCEDURE TRAY) ×3 IMPLANT
PAD ARMBOARD 7.5X6 YLW CONV (MISCELLANEOUS) ×6 IMPLANT
RTRCTR WOUND ALEXIS 18CM MED (MISCELLANEOUS) ×3
SET HNDPC FAN SPRY TIP SCT (DISPOSABLE) ×1 IMPLANT
STAPLER VISISTAT 35W (STAPLE) ×3 IMPLANT
STRIP CLOSURE SKIN 1/2X4 (GAUZE/BANDAGES/DRESSINGS) IMPLANT
SUT ETHIBOND NAB CT1 #1 30IN (SUTURE) ×3 IMPLANT
SUT MNCRL AB 4-0 PS2 18 (SUTURE) IMPLANT
SUT VIC AB 0 CT1 27 (SUTURE) ×2
SUT VIC AB 0 CT1 27XBRD ANBCTR (SUTURE) ×1 IMPLANT
SUT VIC AB 1 CT1 27 (SUTURE) ×2
SUT VIC AB 1 CT1 27XBRD ANBCTR (SUTURE) ×1 IMPLANT
SUT VIC AB 2-0 CT1 27 (SUTURE) ×2
SUT VIC AB 2-0 CT1 TAPERPNT 27 (SUTURE) ×1 IMPLANT
TOWEL OR 17X24 6PK STRL BLUE (TOWEL DISPOSABLE) ×3 IMPLANT
TOWEL OR 17X26 10 PK STRL BLUE (TOWEL DISPOSABLE) ×3 IMPLANT
TRAY FOLEY CATH 16FRSI W/METER (SET/KITS/TRAYS/PACK) ×3 IMPLANT
WATER STERILE IRR 1000ML POUR (IV SOLUTION) ×6 IMPLANT

## 2015-05-26 NOTE — Progress Notes (Signed)
Utilization review completed.  
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
## 2015-05-26 NOTE — Progress Notes (Signed)
Patient ID: Valerie Hanna, female   DOB: 11-03-1940, 74 y.o.   MRN: [id number] I have seen her x-rays showing a right hip femoral neck fracture.  She will need surgery for a partial vs total hip replacement.  Would like to consider surgery tonight if possible from a medical clearance standpoint.  Will see her in the next 2 hours.

## 2015-05-26 NOTE — ED Provider Notes (Signed)
CSN: [id number]     Arrival date & time 05/26/15  1243 History   First MD Initiated Contact with Patient 05/26/15 1243     Chief Complaint  Patient presents with  . Hip Pain   74 yo F w/PMH of stroke (2 years ago, now walks w/cane), GERD, OA, and HLD who presents by EMS for leg pain. Pt says she had a mechanical fall on Monday while getting up from her recliner without her cane and subsequently hurt her right leg. Her husband wouldn't let her call for an ambulance but helped her back into her recliner. She has had severe, 10/10 pain in her right leg since the fall and has not been able to walk on it. She denies CP, SOB, fever, chills, N/V, diarrhea, constipation, hematemesis, dysuria, hematuria, sick contacts, or recent travel.   Patient is a 73 y.o. female presenting with hip pain.  Hip Pain This is a new problem. The current episode started in the past 7 days. The problem occurs constantly. The problem has been unchanged. Pertinent negatives include no abdominal pain, chest pain, chills, fever, headaches, nausea, numbness, vomiting or weakness. The symptoms are aggravated by standing (moving leg). She has tried nothing for the symptoms.    Past Medical History  Diagnosis Date  . Allergy   . Anxiety   . High cholesterol   . Stroke [facility])     left side weakness  . Hypertension     no longer taking meds since stroke  . Pleurisy     being treated for this now 01/25/14 (left side)  . Depression   . History of kidney infection   . GERD (gastroesophageal reflux disease)   . H/O hiatal hernia   . Headache(784.0)   . Osteoporosis   . Arthritis   . Cancer ([place])     melanoma  . Anemia   . Constipation    Past Surgical History  Procedure Laterality Date  . Foot surgery Bilateral   . Abdominal hysterectomy    . Bladder tack    . Eye surgery Right     cataract removed  . Eye surgery Right     "bubble" put in for a hole in retina  . Appendectomy    . Cholecystectomy    . Back  surgery      kyphoplasty  . Colonoscopy    . Dilation and curettage of uterus    . Kyphoplasty N/A 01/28/2014    Procedure: LUMBAR FOUR KYPHOPLASTY;  Surgeon: Ashok Pall, MD;  Location: [facility];  Service: Neurosurgery;  Laterality: N/A;  L4 Kyphoplasty   History reviewed. No pertinent family history. Social History  Substance Use Topics  . Smoking status: Current Every Day Smoker -- 0.50 packs/day    Types: Cigarettes  . Smokeless tobacco: Never Used  . Alcohol Use: 8.4 - 12.6 oz/week    14-21 Glasses of wine per week   OB History    No data available     Review of Systems  Constitutional: Negative for fever and chills.  Respiratory: Negative for shortness of breath.   Cardiovascular: Negative for chest pain, palpitations and leg swelling.  Gastrointestinal: Negative for nausea, vomiting, abdominal pain, diarrhea, constipation and abdominal distention.  Genitourinary: Negative for dysuria, frequency, flank pain and decreased urine volume.  Musculoskeletal: Negative for back pain.       Right leg pain, severe.  Neurological: Negative for dizziness, speech difficulty, weakness, light-headedness, numbness and headaches.  All other systems reviewed and  are negative.     Allergies  Aspirin  Home Medications   Prior to Admission medications   Medication Sig Start Date End Date Taking? Authorizing Provider  ALPRAZolam (XANAX) 0.25 MG tablet Take 1 tablet (0.25 mg total) by mouth at bedtime as needed for anxiety. 04/18/15  Yes Glean Hess, MD  CALCIUM PO Take 1 tablet by mouth daily.   Yes Historical Provider, MD  clopidogrel (PLAVIX) 75 MG tablet TAKE 1 TABLET BY MOUTH EVERY DAY 03/29/15  Yes Glean Hess, MD  Cyanocobalamin (VITAMIN B-12 PO) Take 1 tablet by mouth daily.   Yes Historical Provider, MD  Ergocalciferol 2000 UNITS TABS Take 1 tablet by mouth daily.   Yes Historical Provider, MD  fexofenadine (ALLEGRA) 60 MG tablet Take 60 mg by mouth daily.   Yes  Historical Provider, MD  ipratropium (ATROVENT) 0.06 % nasal spray Place 2 sprays into both nostrils 2 (two) times daily.   Yes Historical Provider, MD  mirtazapine (REMERON) 30 MG tablet TAKE ONE TABLET BY MOUTH NIGHTLY AT BEDTIME. 04/10/15  Yes Glean Hess, MD  Multiple Vitamin (MULTIVITAMIN WITH MINERALS) TABS tablet Take 1 tablet by mouth daily.   Yes Historical Provider, MD  pantoprazole (PROTONIX) 40 MG tablet TAKE 1 TABLET BY MOUTH TWICE DAILY 04/14/15  Yes Glean Hess, MD  sertraline (ZOLOFT) 100 MG tablet Take 1 tablet (100 mg total) by mouth at bedtime. 12/01/14  Yes Glean Hess, MD  simvastatin (ZOCOR) 40 MG tablet TAKE ONE TABLETS BY MOUTH NIGHTLY AT BEDTIME 03/10/15  Yes Glean Hess, MD  traMADol (ULTRAM) 50 MG tablet Take 1 tablet (50 mg total) by mouth every 8 (eight) hours as needed. Patient taking differently: Take 50 mg by mouth every 8 (eight) hours as needed for moderate pain or severe pain.  05/18/15  Yes Glean Hess, MD   BP 149/117 mmHg  Pulse 97  Resp 19  SpO2 96% Physical Exam  Constitutional: She is oriented to person, place, and time. She appears well-developed and well-nourished. No distress.  HENT:  Head: Normocephalic and atraumatic.  Eyes: Pupils are equal, round, and reactive to light.  Cardiovascular: Normal rate, regular rhythm, normal heart sounds and intact distal pulses.  Exam reveals no gallop and no friction rub.   No murmur heard. Pulmonary/Chest: Effort normal and breath sounds normal. No respiratory distress. She has no wheezes. She has no rales. She exhibits no tenderness.  Abdominal: Soft. Bowel sounds are normal. She exhibits no distension and no mass. There is no tenderness. There is no rebound and no guarding.  Musculoskeletal: She exhibits tenderness (right hip and femur).  Sensation intact to RLE. Normal peripheral pulses. Leg compartment soft. ROM limited 2/2 pain.   Lymphadenopathy:    She has no cervical adenopathy.   Neurological: She is alert and oriented to person, place, and time.  Skin: Skin is warm and dry. She is not diaphoretic.  Nursing note and vitals reviewed.   ED Course  Procedures (including critical care time) Labs Review Labs Reviewed  CBC WITH DIFFERENTIAL/PLATELET - Abnormal; Notable for the following:    WBC 13.2 (*)    RBC 3.16 (*)    Hemoglobin 11.1 (*)    HCT 33.3 (*)    MCV 105.4 (*)    MCH 35.1 (*)    Neutro Abs 10.5 (*)    Monocytes Absolute 1.3 (*)    All other components within normal limits  BASIC METABOLIC PANEL - Abnormal; Notable for  the following:    Sodium 132 (*)    Chloride 97 (*)    Glucose, Bld 126 (*)    All other components within normal limits  CK - Abnormal; Notable for the following:    Total CK 845 (*)    All other components within normal limits  URINE CULTURE  PROTIME-INR  APTT  URINALYSIS, ROUTINE W REFLEX MICROSCOPIC (NOT AT [facility])  TYPE AND SCREEN  ABO/RH    Imaging Review Dg Chest 1 View  05/26/2015  CLINICAL DATA:  Fall Monday.  Pain in hip. EXAM: CHEST 1 VIEW COMPARISON:  02/01/2013 FINDINGS: Patient is rotated to the right. No confluent airspace opacities. Old bilateral rib fractures. No effusions. Heart is upper limits normal in size. Multilevel vertebroplasty changes in the lumbar spine. IMPRESSION: No active disease. Electronically Signed   By: Rolm Baptise M.D.   On: 05/26/2015 14:42   Dg Hip Unilat With Pelvis 2-3 Views Right  05/26/2015  CLINICAL DATA:  Status post fall 05/23/2015 with continued right hip pain. Initial encounter. EXAM: DG HIP (WITH OR WITHOUT PELVIS) 2-3V RIGHT COMPARISON:  None. FINDINGS: The patient has an acute subcapital fracture of the right hip. The femoral head is located. Bones are osteopenic. No other acute abnormality is identified. IMPRESSION: Acute subcapital right hip fracture. Electronically Signed   By: Inge Rise M.D.   On: 05/26/2015 14:41   Dg Femur 1v Right  05/26/2015  CLINICAL DATA:   Status post fall 05/23/2015 with right hip and upper leg pain. Initial encounter. EXAM: RIGHT FEMUR 1 VIEW COMPARISON:  None. FINDINGS: Subcapital right hip fracture is identified is seen on dedicated plain films of the right hip today. No other acute bony or joint abnormality is seen. Bones are osteopenic. IMPRESSION: Acute subcapital right hip fracture.  Otherwise negative. Electronically Signed   By: Inge Rise M.D.   On: 05/26/2015 14:42   I have personally reviewed and evaluated these images and lab results as part of my medical decision-making.   EKG Interpretation None      MDM   Final diagnoses:  Fall  Femoral neck fracture, right, closed, initial encounter   74 yo F w/severe right leg pain after fall 4 days ago. On exam, pt in moderate distress 2/2 pain. AFVSS. See HPI for details. Right leg shortened and externally rotated. DP pulses intact. Compartments soft. Ttp of right hip/femur. No knee, ankle, tib/fib, or foot involvement. Remainder of exam benign. Concern for femur/hip fxr +/- dislocation.   XRs show femoral neck fx. CK elevated, but not in rhabdo. Cr nml. Given bolus NS. Pain controlled w/fentanyl.   Consulted ortho (Dr. Ninfa Linden) who will see pt in hospital after clinic. Anticipate surgery. Admit to hospitalist.   Pt was seen under the supervision of Dr. Sabra Heck.     Sherian Maroon, MD 05/26/15 [place], MD 05/26/15 [place], MD 05/28/15 [phone]

## 2015-05-26 NOTE — Consult Note (Signed)
Reason for Consult:  Right hip with displaced femoral neck fracture Referring Physician: EDP  Valerie Hanna is an 74 y.o. female.  HPI:   74 yo female who sustained an accidental mechanical fall this past Monday (3 days ago).  Was put in a recliner chair by her husband because she did not want to come to the hospital. Finally was brought in today to the ED today with severe right hip pain and the inability to ambulate.  Was found to have a displaced right hip fracture and Ortho was consulted for further evaluation and treatment.  She only reports severe right hip pain.  Her husband is at the bedside.  Past Medical History  Diagnosis Date  . Allergy   . Anxiety   . High cholesterol   . Stroke [facility])     left side weakness  . Hypertension     no longer taking meds since stroke  . Pleurisy     being treated for this now 01/25/14 (left side)  . Depression   . History of kidney infection   . GERD (gastroesophageal reflux disease)   . H/O hiatal hernia   . Headache(784.0)   . Osteoporosis   . Arthritis   . Cancer ([place])     melanoma  . Anemia   . Constipation     Past Surgical History  Procedure Laterality Date  . Foot surgery Bilateral   . Abdominal hysterectomy    . Bladder tack    . Eye surgery Right     cataract removed  . Eye surgery Right     "bubble" put in for a hole in retina  . Appendectomy    . Cholecystectomy    . Back surgery      kyphoplasty  . Colonoscopy    . Dilation and curettage of uterus    . Kyphoplasty N/A 01/28/2014    Procedure: LUMBAR FOUR KYPHOPLASTY;  Surgeon: Ashok Pall, MD;  Location: [facility];  Service: Neurosurgery;  Laterality: N/A;  L4 Kyphoplasty    History reviewed. No pertinent family history.  Social History:  reports that she has been smoking Cigarettes.  She has been smoking about 0.50 packs per day. She has never used smokeless tobacco. She reports that she drinks about 8.4 - 12.6 oz of alcohol per week. She reports that she  does not use illicit drugs.  Allergies:  Allergies  Allergen Reactions  . Aspirin Other (See Comments)    Kidney problems    Medications: I have reviewed the patient's current medications.  Results for orders placed or performed during the hospital encounter of 05/26/15 (from the past 48 hour(s))  CBC with Differential     Status: Abnormal   Collection Time: 05/26/15 12:54 PM  Result Value Ref Range   WBC 13.2 (H) 4.0 - 10.5 K/uL   RBC 3.16 (L) 3.87 - 5.11 MIL/uL   Hemoglobin 11.1 (L) 12.0 - 15.0 g/dL   HCT 33.3 (L) 36.0 - 46.0 %   MCV 105.4 (H) 78.0 - 100.0 fL   MCH 35.1 (H) 26.0 - 34.0 pg   MCHC 33.3 30.0 - 36.0 g/dL   RDW 12.2 11.5 - 15.5 %   Platelets 291 150 - 400 K/uL   Neutrophils Relative % 79 %   Neutro Abs 10.5 (H) 1.7 - 7.7 K/uL   Lymphocytes Relative 10 %   Lymphs Abs 1.3 0.7 - 4.0 K/uL   Monocytes Relative 10 %   Monocytes Absolute 1.3 (H) 0.1 -  1.0 K/uL   Eosinophils Relative 1 %   Eosinophils Absolute 0.1 0.0 - 0.7 K/uL   Basophils Relative 0 %   Basophils Absolute 0.0 0.0 - 0.1 K/uL  Basic metabolic panel     Status: Abnormal   Collection Time: 05/26/15 12:54 PM  Result Value Ref Range   Sodium 132 (L) 135 - 145 mmol/L   Potassium 4.1 3.5 - 5.1 mmol/L    Comment: HEMOLYSIS AT THIS LEVEL MAY AFFECT RESULT   Chloride 97 (L) 101 - 111 mmol/L   CO2 23 22 - 32 mmol/L   Glucose, Bld 126 (H) 65 - 99 mg/dL   BUN 9 6 - 20 mg/dL   Creatinine, Ser 0.51 0.44 - 1.00 mg/dL   Calcium 9.0 8.9 - 10.3 mg/dL   GFR calc non Af Amer >60 >60 mL/min   GFR calc Af Amer >60 >60 mL/min    Comment: (NOTE) The eGFR has been calculated using the CKD EPI equation. This calculation has not been validated in all clinical situations. eGFR's persistently <60 mL/min signify possible Chronic Kidney Disease.    Anion gap 12 5 - 15  CK     Status: Abnormal   Collection Time: 05/26/15 12:54 PM  Result Value Ref Range   Total CK 845 (H) 38 - 234 U/L  Protime-INR     Status: None    Collection Time: 05/26/15 12:54 PM  Result Value Ref Range   Prothrombin Time 13.7 11.6 - 15.2 seconds   INR 1.03 0.00 - 1.49  Type and screen [place]     Status: None   Collection Time: 05/26/15 12:54 PM  Result Value Ref Range   ABO/RH(D) A POS    Antibody Screen NEG    Sample Expiration 05/29/2015   APTT     Status: None   Collection Time: 05/26/15 12:54 PM  Result Value Ref Range   aPTT 29 24 - 37 seconds  ABO/Rh     Status: None   Collection Time: 05/26/15 12:54 PM  Result Value Ref Range   ABO/RH(D) A POS     Dg Chest 1 View  05/26/2015  CLINICAL DATA:  Fall Monday.  Pain in hip. EXAM: CHEST 1 VIEW COMPARISON:  02/01/2013 FINDINGS: Patient is rotated to the right. No confluent airspace opacities. Old bilateral rib fractures. No effusions. Heart is upper limits normal in size. Multilevel vertebroplasty changes in the lumbar spine. IMPRESSION: No active disease. Electronically Signed   By: Rolm Baptise M.D.   On: 05/26/2015 14:42   Dg Hip Unilat With Pelvis 2-3 Views Right  05/26/2015  CLINICAL DATA:  Status post fall 05/23/2015 with continued right hip pain. Initial encounter. EXAM: DG HIP (WITH OR WITHOUT PELVIS) 2-3V RIGHT COMPARISON:  None. FINDINGS: The patient has an acute subcapital fracture of the right hip. The femoral head is located. Bones are osteopenic. No other acute abnormality is identified. IMPRESSION: Acute subcapital right hip fracture. Electronically Signed   By: Inge Rise M.D.   On: 05/26/2015 14:41   Dg Femur 1v Right  05/26/2015  CLINICAL DATA:  Status post fall 05/23/2015 with right hip and upper leg pain. Initial encounter. EXAM: RIGHT FEMUR 1 VIEW COMPARISON:  None. FINDINGS: Subcapital right hip fracture is identified is seen on dedicated plain films of the right hip today. No other acute bony or joint abnormality is seen. Bones are osteopenic. IMPRESSION: Acute subcapital right hip fracture.  Otherwise negative. Electronically  Signed   By: Marcello Moores  Dalessio M.D.   On: 05/26/2015 14:42    Review of Systems  Musculoskeletal: Positive for joint pain.  All other systems reviewed and are negative.  Blood pressure 139/69, pulse 108, temperature 99.3 F (37.4 C), resp. rate 18, SpO2 93 %. Physical Exam  Constitutional: She is oriented to person, place, and time. She appears well-developed and well-nourished.  HENT:  Head: Normocephalic and atraumatic.  Eyes: EOM are normal. Pupils are equal, round, and reactive to light.  Neck: Normal range of motion. Neck supple.  Cardiovascular: Normal rate and regular rhythm.   Respiratory: Effort normal and breath sounds normal.  GI: Soft. Bowel sounds are normal.  Musculoskeletal:       Right hip: She exhibits decreased range of motion, decreased strength, tenderness, bony tenderness and deformity.  Neurological: She is alert and oriented to person, place, and time.  Skin: Skin is warm and dry.  Psychiatric: She has a normal mood and affect.    Assessment/Plan: Displaced right hip femoral neck fracture. 1)  Will proceed to the OR this evening for a partial vs total hip replacement thru a direct anterior approach.  The risks and benefits have been discussed in detail with her and her husband.  Mcarthur Rossetti 05/26/2015, 5:50 PM

## 2015-05-26 NOTE — ED Provider Notes (Signed)
I saw and evaluated the patient, reviewed the resident's note and I agree with the findings and plan.  Pertinent History: Pt fell with mechanical fall 3 days ago and has been in a recliner since - she has had severe R sided pain in the R hip - she has inability to ambulate - no cP, sob, or fevers Pertinent Exam findings: shortned, externally rotated R leg - normal pulses and sensation to the RLE including the foot.  No other signs of trauma.  Suspect fracture, labs, xray, ecg, question why pt has not come in 3 days to seek treatment -     I personally interpreted the EKG as well as the resident and agree with the interpretation on the resident's chart.  Final diagnoses:  Fall  Femoral neck fracture, right, closed, initial encounter      Noemi Chapel, MD 05/28/15 [phone]

## 2015-05-26 NOTE — Transfer of Care (Signed)
Immediate Anesthesia Transfer of Care Note  Patient: Valerie Hanna  Procedure(s) Performed: Procedure(s): RIGHT BIPOLAR VS TOTAL HIP ANTERIOR APPROACH (Right)  Patient Location: PACU  Anesthesia Type:General  Level of Consciousness: awake and alert   Airway & Oxygen Therapy: Patient Spontanous Breathing and Patient connected to face mask oxygen  Post-op Assessment: Report given to RN and Post -op Vital signs reviewed and stable  Post vital signs: Reviewed and stable  Last Vitals:  Filed Vitals:   05/26/15 1545 05/26/15 1646  BP: 144/66 139/69  Pulse: 102 108  Temp:  37.4 C  Resp: 23 18    Complications: No apparent anesthesia complications

## 2015-05-26 NOTE — Brief Op Note (Signed)
05/26/2015  9:08 PM  PATIENT:  Valerie Hanna  74 y.o. female  PRE-OPERATIVE DIAGNOSIS:  Right Femoral neck fracture  POST-OPERATIVE DIAGNOSIS:  Right femoral neck fracture  PROCEDURE:  Procedure(s): RIGHT BIPOLAR VS TOTAL HIP ANTERIOR APPROACH (Right)  SURGEON:  Surgeon(s) and Role:    * Mcarthur Rossetti, MD - Primary  PHYSICIAN ASSISTANT: Benita Stabile, PA-C  ANESTHESIA:   general  EBL:  Total I/O In: 1500 [I.V.:1500] Out: [id number] [Urine:375; Blood:500]  BLOOD ADMINISTERED:none  DRAINS: none   LOCAL MEDICATIONS USED:  NONE  SPECIMEN:  No Specimen  DISPOSITION OF SPECIMEN:  N/A  COUNTS:  YES  TOURNIQUET:  * No tourniquets in log *  DICTATION: .Other Dictation: Dictation Number [id number]  PLAN OF CARE: Admit to inpatient   PATIENT DISPOSITION:  PACU - hemodynamically stable.   Delay start of Pharmacological VTE agent (>24hrs) due to surgical blood loss or risk of bleeding: no

## 2015-05-26 NOTE — Anesthesia Procedure Notes (Signed)
Procedure Name: Intubation Date/Time: 05/26/2015 7:30 PM Performed by: Trixie Deis A Pre-anesthesia Checklist: Patient identified, Emergency Drugs available, Suction available, Patient being monitored and Timeout performed Patient Re-evaluated:Patient Re-evaluated prior to inductionOxygen Delivery Method: Circle system utilized Preoxygenation: Pre-oxygenation with 100% oxygen Intubation Type: IV induction Ventilation: Mask ventilation without difficulty Laryngoscope Size: Miller and 2 Grade View: Grade I Tube type: Oral Tube size: 7.0 mm Number of attempts: 2 Airway Equipment and Method: Stylet Placement Confirmation: positive ETCO2,  breath sounds checked- equal and bilateral and ETT inserted through vocal cords under direct vision Secured at: 21 cm Tube secured with: Tape Dental Injury: Teeth and Oropharynx as per pre-operative assessment and Injury to lip

## 2015-05-26 NOTE — Anesthesia Postprocedure Evaluation (Signed)
Anesthesia Post Note  Patient: Valerie Hanna  Procedure(s) Performed: Procedure(s) (LRB): RIGHT BIPOLAR VS TOTAL HIP ANTERIOR APPROACH (Right)  Patient location during evaluation: PACU Anesthesia Type: General Level of consciousness: awake Pain management: pain level controlled Vital Signs Assessment: post-procedure vital signs reviewed and stable Respiratory status: spontaneous breathing and respiratory function stable Cardiovascular status: blood pressure returned to baseline and stable Anesthetic complications: no    Last Vitals:  Filed Vitals:   05/26/15 1545 05/26/15 1646  BP: 144/66 139/69  Pulse: 102 108  Temp:  37.4 C  Resp: 23 18    Last Pain:  Filed Vitals:   05/26/15 1929  PainSc: [place]

## 2015-05-26 NOTE — Anesthesia Preprocedure Evaluation (Signed)
Anesthesia Evaluation  Patient identified by MRN, date of birth, ID band Patient awake    Reviewed: Allergy & Precautions, NPO status , Patient's Chart, lab work & pertinent test results  Airway Mallampati: II  TM Distance: >3 FB     Dental   Pulmonary Current Smoker,    Pulmonary exam normal        Cardiovascular hypertension, Normal cardiovascular exam     Neuro/Psych  Headaches, Anxiety Depression  Neuromuscular disease CVA, Residual Symptoms    GI/Hepatic hiatal hernia, GERD  ,  Endo/Other    Renal/GU      Musculoskeletal  (+) Arthritis ,   Abdominal   Peds  Hematology  (+) anemia ,   Anesthesia Other Findings   Reproductive/Obstetrics                             Anesthesia Physical Anesthesia Plan  ASA: III  Anesthesia Plan: General   Post-op Pain Management:    Induction: Intravenous  Airway Management Planned: Oral ETT  Additional Equipment:   Intra-op Plan:   Post-operative Plan: Extubation in OR  Informed Consent: I have reviewed the patients History and Physical, chart, labs and discussed the procedure including the risks, benefits and alternatives for the proposed anesthesia with the patient or authorized representative who has indicated his/her understanding and acceptance.     Plan Discussed with: CRNA, Anesthesiologist and Surgeon  Anesthesia Plan Comments:         Anesthesia Quick Evaluation

## 2015-05-26 NOTE — ED Notes (Addendum)
Pt to ER via [facility] from home after a fall on Monday night, pt reports falling in the living room. Obvious shortening and rotation to the right leg. Obvious deformity to right femur. VSS per EMS. Pt has hx of stroke and walks with a cane. Pt denies LOC Monday night. Pt reports she has been in the recliner since the fall. Pt is a/o x4. Pt received 150 mcg of fentanyl in route.

## 2015-05-27 ENCOUNTER — Encounter (HOSPITAL_COMMUNITY): Payer: Self-pay | Admitting: Orthopaedic Surgery

## 2015-05-27 DIAGNOSIS — E785 Hyperlipidemia, unspecified: Secondary | ICD-10-CM

## 2015-05-27 DIAGNOSIS — Z9149 Other personal history of psychological trauma, not elsewhere classified: Secondary | ICD-10-CM

## 2015-05-27 DIAGNOSIS — D62 Acute posthemorrhagic anemia: Secondary | ICD-10-CM

## 2015-05-27 DIAGNOSIS — K210 Gastro-esophageal reflux disease with esophagitis: Secondary | ICD-10-CM

## 2015-05-27 DIAGNOSIS — I69359 Hemiplegia and hemiparesis following cerebral infarction affecting unspecified side: Secondary | ICD-10-CM

## 2015-05-27 LAB — BASIC METABOLIC PANEL
Anion gap: 7 (ref 5–15)
BUN: 9 mg/dL (ref 6–20)
CO2: 25 mmol/L (ref 22–32)
CREATININE: 0.47 mg/dL (ref 0.44–1.00)
Calcium: 7.8 mg/dL — ABNORMAL LOW (ref 8.9–10.3)
Chloride: 100 mmol/L — ABNORMAL LOW (ref 101–111)
Glucose, Bld: 100 mg/dL — ABNORMAL HIGH (ref 65–99)
POTASSIUM: 4.1 mmol/L (ref 3.5–5.1)
SODIUM: 132 mmol/L — AB (ref 135–145)

## 2015-05-27 LAB — URINALYSIS, ROUTINE W REFLEX MICROSCOPIC
BILIRUBIN URINE: NEGATIVE
Glucose, UA: NEGATIVE mg/dL
Hgb urine dipstick: NEGATIVE
KETONES UR: 15 mg/dL — AB
LEUKOCYTES UA: NEGATIVE
NITRITE: NEGATIVE
PROTEIN: 30 mg/dL — AB
Specific Gravity, Urine: 1.03 (ref 1.005–1.030)
pH: 5 (ref 5.0–8.0)

## 2015-05-27 LAB — VITAMIN D 25 HYDROXY (VIT D DEFICIENCY, FRACTURES): Vit D, 25-Hydroxy: 46.8 ng/mL (ref 30.0–100.0)

## 2015-05-27 LAB — URINE MICROSCOPIC-ADD ON: RBC / HPF: NONE SEEN RBC/hpf (ref 0–5)

## 2015-05-27 LAB — CBC
HEMATOCRIT: 24.2 % — AB (ref 36.0–46.0)
Hemoglobin: 8.1 g/dL — ABNORMAL LOW (ref 12.0–15.0)
MCH: 35.7 pg — ABNORMAL HIGH (ref 26.0–34.0)
MCHC: 33.5 g/dL (ref 30.0–36.0)
MCV: 106.6 fL — AB (ref 78.0–100.0)
PLATELETS: 265 10*3/uL (ref 150–400)
RBC: 2.27 MIL/uL — ABNORMAL LOW (ref 3.87–5.11)
RDW: 12.4 % (ref 11.5–15.5)
WBC: 13.7 10*3/uL — AB (ref 4.0–10.5)

## 2015-05-27 LAB — BLOOD PRODUCT ORDER (VERBAL) VERIFICATION

## 2015-05-27 LAB — CK: CK TOTAL: 1207 U/L — AB (ref 38–234)

## 2015-05-27 MED ORDER — MUPIROCIN 2 % EX OINT
1.0000 "application " | TOPICAL_OINTMENT | Freq: Two times a day (BID) | CUTANEOUS | Status: AC
  Administered 2015-05-27 – 2015-05-31 (×10): 1 via NASAL
  Filled 2015-05-27: qty 22

## 2015-05-27 MED ORDER — ENSURE ENLIVE PO LIQD
237.0000 mL | Freq: Two times a day (BID) | ORAL | Status: DC
  Administered 2015-05-27 – 2015-06-01 (×10): 237 mL via ORAL

## 2015-05-27 MED ORDER — CHLORHEXIDINE GLUCONATE CLOTH 2 % EX PADS
6.0000 | MEDICATED_PAD | Freq: Every day | CUTANEOUS | Status: AC
  Administered 2015-05-27 – 2015-05-31 (×3): 6 via TOPICAL

## 2015-05-27 NOTE — Clinical Social Work Placement (Signed)
   CLINICAL SOCIAL WORK PLACEMENT  NOTE  Date:  05/27/2015  Patient Details  Name: Valerie Hanna MRN: [id number] Date of Birth: April 28, 1941  Clinical Social Work is seeking post-discharge placement for this patient at the [place] level of care (*CSW will initial, date and re-position this form in  chart as items are completed):  Yes   Patient/family provided with [place] Work Department's list of facilities offering this level of care within the geographic area requested by the patient (or if unable, by the patient's family).  Yes   Patient/family informed of their freedom to choose among providers that offer the needed level of care, that participate in Medicare, Medicaid or managed care program needed by the patient, have an available bed and are willing to accept the patient.  Yes   Patient/family informed of 's ownership interest in [facility] and [facility], as well as of the fact that they are under no obligation to receive care at these facilities.  PASRR submitted to EDS on 05/27/15     PASRR number received on       Existing PASRR number confirmed on       FL2 transmitted to all facilities in geographic area requested by pt/family on 05/27/15     FL2 transmitted to all facilities within larger geographic area on       Patient informed that his/her managed care company has contracts with or will negotiate with certain facilities, including the following:            Patient/family informed of bed offers received.  Patient chooses bed at       Physician recommends and patient chooses bed at      Patient to be transferred to   on  .  Patient to be transferred to facility by       Patient family notified on   of transfer.  Name of family member notified:        PHYSICIAN       Additional Comment:    _______________________________________________ Linna Darner, LCSW 05/27/2015, 3:06 PM

## 2015-05-27 NOTE — Care Management Note (Signed)
Case Management Note  Patient Details  Name: Valerie Hanna MRN: [id number] Date of Birth: 07-09-1940  Subjective/Objective:      Admitted with right hip fracture, s/p right total hip arthroplasty              Action/Plan: PT recommending SNF. Referral made to CSW, will continue to follow.  Expected Discharge Date:                  Expected Discharge Plan:  Skilled Nursing Facility  In-House Referral:  Clinical Social Work  Discharge planning Services  CM Consult  Post Acute Care Choice:    Choice offered to:     DME Arranged:    DME Agency:     HH Arranged:    [place] Agency:     Status of Service:  In process, will continue to follow  Medicare Important Message Given:    Date Medicare IM Given:    Medicare IM give by:    Date Additional Medicare IM Given:    Additional Medicare Important Message give by:     If discussed at [place] of Stay Meetings, dates discussed:    Additional Comments:  Nila Nephew, RN 05/27/2015, 1:31 PM

## 2015-05-27 NOTE — Progress Notes (Signed)
Subjective: 1 Day Post-Op Procedure(s) (LRB): RIGHT BIPOLAR VS TOTAL HIP ANTERIOR APPROACH (Right) Patient reports pain as moderate.  Acute blood loss anemia from a combination of her fracture, her surgery, and her being on Plavix.  Objective: Vital signs in last 24 hours: Temp:  [99 F (37.2 C)-100.7 F (38.2 C)] 100.7 F (38.2 C) (12/23 0522) Pulse Rate:  [92-127] 107 (12/23 0522) Resp:  [14-23] 18 (12/23 0522) BP: (94-150)/(56-117) 125/72 mmHg (12/23 0522) SpO2:  [93 %-99 %] 99 % (12/23 0522) Weight:  [56.7 kg (125 lb)] 56.7 kg (125 lb) (12/23 0154)  Intake/Output from previous day: 12/22 0701 - 12/23 0700 In: 2900 [P.O.:400; I.V.:2500] Out: 875 [Urine:375; Blood:500] Intake/Output this shift: Total I/O In: 1900 [P.O.:400; I.V.:1500] Out: 875 [Urine:375; Blood:500]   Recent Labs  05/26/15 1254  HGB 11.1*    Recent Labs  05/26/15 1254  WBC 13.2*  RBC 3.16*  HCT 33.3*  PLT 291    Recent Labs  05/26/15 1254 05/26/15 1823  NA 132*  --   K 4.1  --   CL 97*  --   CO2 23  --   BUN 9  --   CREATININE 0.51  --   GLUCOSE 126*  --   CALCIUM 9.0 8.2*    Recent Labs  05/26/15 1254  INR 1.03    Sensation intact distally Intact pulses distally Dorsiflexion/Plantar flexion intact Incision: scant drainage  Assessment/Plan: 1 Day Post-Op Procedure(s) (LRB): RIGHT BIPOLAR VS TOTAL HIP ANTERIOR APPROACH (Right) Up with therapy - WBAT right hip  Kyngston Pickelsimer Y 05/27/2015, 6:45 AM

## 2015-05-27 NOTE — Progress Notes (Signed)
Initial Nutrition Assessment  DOCUMENTATION CODES:   Not applicable  INTERVENTION:    Ensure Enlive PO BID, each supplement provides 350 kcal and 20 grams of protein  NUTRITION DIAGNOSIS:   Increased nutrient needs related to wound healing, acute illness as evidenced by estimated needs.  GOAL:   Patient will meet greater than or equal to 90% of their needs  MONITOR:   PO intake, Supplement acceptance, Labs, Weight trends, I & O's  REASON FOR ASSESSMENT:   Consult Hip fracture protocol  ASSESSMENT:   74 year old female with history of alcohol abuse (reports drinking 2 and 1/2 cups of wine daily), GERD, osteoporosis, anxiety and depression, malnutrition, history of stroke on Plavix, recurrent falls at home presented after falling at home again today and complained of right hip pain. Admitted for hip fx. S/P surgery 12/22.  Nutrition focused physical exam completed.  No muscle or subcutaneous fat depletion noticed. Patient reports feeling bad for the past week and eating poorly during that time. She has not lost any weight. Consumed ~50% of breakfast this AM. She agreed to drink Ensure supplements between meals to maximize protein and calorie intake to support healing and recovery.  Diet Order:  Diet regular Room service appropriate?: Yes; Fluid consistency:: Thin  Skin:  Reviewed, no issues  Last BM:  12/18  Height:   Ht Readings from Last 1 Encounters:  05/27/15 5\' 3"  (1.6 m)    Weight:   Wt Readings from Last 1 Encounters:  05/27/15 125 lb (56.7 kg)    Ideal Body Weight:  52.3 kg  BMI:  Body mass index is 22.15 kg/(m^2).  Estimated Nutritional Needs:   Kcal:  1400-1600  Protein:  80-95 gm  Fluid:  1.5 L  EDUCATION NEEDS:   Education needs addressed  Molli Barrows, [place], [place], [place] Pager [phone] After Hours Pager [phone]
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
## 2015-05-27 NOTE — Evaluation (Signed)
Physical Therapy Evaluation Patient Details Name: Valerie Hanna MRN: [id number] DOB: 06-Aug-1940 Today's Date: 05/27/2015   History of Present Illness  Pt admitted after 2 recent falls 12/3 and 12/19 with R hip fx s/p R anterior THA. PMHx: ETOH abuse, GERD, CVA, HTN  Clinical Impression  Pt was shaking throughout session claiming that it was because she was cold and in some pain. She presents with generalized weakness in particular R LE control, decreased balance, and reduced activity tolerance. SpO2 was 87% on RA at rest but rebounded to 96% after 2L of O2 was reappled. Pt was anxious she would fall throughout mobility. She presented with significant posterior lean and difficulty positioning legs for standing support during transfers. Immediately after transferring, HR 141 but slowly decreased to 118 by end of tx. Education was provided on positioning, transferring only with staff, and HEP. She would benefit from acute PT to improve activity tolerance and increase R LE strength/ROM to decrease burden of care. Once medically cleared, D/C to SNF would be most appropriate.    Follow Up Recommendations SNF    Equipment Recommendations       Recommendations for Other Services OT consult     Precautions / Restrictions Precautions Precautions: Fall Restrictions Weight Bearing Restrictions: Yes RLE Weight Bearing: Weight bearing as tolerated      Mobility  Bed Mobility Overal bed mobility: Needs Assistance Bed Mobility: Supine to Sit     Supine to sit: Mod assist;HOB elevated     General bed mobility comments: Mod assist for control of R LE, trunk elevation, helicopter pivot. VC for sequencing. Good use of hand rails to help  Transfers Overall transfer level: Needs assistance   Transfers: Sit to/from Stand;Stand Pivot Transfers Sit to Stand: Max assist Stand pivot transfers: Max assist       General transfer comment: Sit to stand required max assist for powering up with  blocking bilat knees for safety and for stability. VC for sequencing and hand placement. Stand pivot required max assist to power up, pivot, and maintain stability. Max VC for sequencing and hand placement during pivot. Pt demonstrated significant posterior lean and L leg positioned forward out of BOS initially but improved throughout  Ambulation/Gait                Stairs            Wheelchair Mobility    Modified Rankin (Stroke Patients Only)       Balance Overall balance assessment: Needs assistance Sitting-balance support: Bilateral upper extremity supported Sitting balance-Leahy Scale: Fair     Standing balance support: Bilateral upper extremity supported Standing balance-Leahy Scale: Zero Standing balance comment: Pt unable to stand without max assist                             Pertinent Vitals/Pain Pain Assessment: 0-10 Pain Score: 7  Pain Location: R Hip  Pain Descriptors / Indicators: Aching Pain Intervention(s): Limited activity within patient's tolerance;Monitored during session;Repositioned    Home Living Family/patient expects to be discharged to:: Skilled nursing facility Living Arrangements: Spouse/significant other             Home Equipment: Walker - 4 wheels;Cane - single point;Bedside commode;Grab bars - tub/shower      Prior Function Level of Independence: Independent with assistive device(s)         Comments: RW for out of house ambulation, cane for in house ambulation  Hand Dominance   Dominant Hand: Right    Extremity/Trunk Assessment   Upper Extremity Assessment: Generalized weakness           Lower Extremity Assessment: LLE deficits/detail;RLE deficits/detail RLE Deficits / Details: decreased strength/movement secondary to surgery LLE Deficits / Details: Weakness associated with previous CVA  Cervical / Trunk Assessment: Kyphotic  Communication   Communication: No difficulties  Cognition  Arousal/Alertness: Awake/alert Behavior During Therapy: Anxious (scared of falling even sitting EOB) Overall Cognitive Status: Within Functional Limits for tasks assessed                      General Comments      Exercises Total Joint Exercises Ankle Circles/Pumps: AROM;Both;20 reps;Seated Quad Sets: AROM;10 reps;Right;Supine Hip ABduction/ADduction: 15 reps;AAROM;Both;Seated Long Arc Quad: AAROM;Right;15 reps;Seated Marching in Standing: AAROM;15 reps;Seated;Right      Assessment/Plan    PT Assessment Patient needs continued PT services  PT Diagnosis Difficulty walking;Generalized weakness;Acute pain   PT Problem List Decreased strength;Decreased range of motion;Decreased activity tolerance;Decreased balance;Decreased mobility;Decreased safety awareness;Decreased knowledge of use of DME;Cardiopulmonary status limiting activity;Pain  PT Treatment Interventions DME instruction;Gait training;Functional mobility training;Therapeutic activities;Therapeutic exercise;Patient/family education;Balance training   PT Goals (Current goals can be found in the Care Plan section) Acute Rehab PT Goals Patient Stated Goal: to return home to care for dogs PT Goal Formulation: With patient Time For Goal Achievement: 06/03/15 Potential to Achieve Goals: Fair    Frequency Min 3X/week   Barriers to discharge Decreased caregiver support Husband could not provide assistance currently needed for safety    Co-evaluation               End of Session Equipment Utilized During Treatment: Gait belt;Oxygen Activity Tolerance: Patient limited by fatigue;Patient limited by pain Patient left: in chair;with chair alarm set;with call bell/phone within reach           Time: 1032-1102 PT Time Calculation (min) (ACUTE ONLY): 30 min   Charges:   PT Evaluation $Initial PT Evaluation Tier I: 1 Procedure PT Treatments $Therapeutic Activity: 8-22 mins   PT G CodesHaynes Bast 05/27/2015, 11:31 AM Haynes Bast, SPT 05/27/2015 11:31 AM
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
## 2015-05-27 NOTE — Op Note (Signed)
NAMEJUTTA, Valerie Hanna               ACCOUNT NO.:  [id number]  MEDICAL RECORD NO.:  [id number]  LOCATION:  [facility]                         FACILITY:  [facility]  PHYSICIAN:  Lind Guest. Ninfa Linden, M.D.DATE OF BIRTH:  03-28-1941  DATE OF PROCEDURE:  05/26/2015 DATE OF DISCHARGE:                              OPERATIVE REPORT   PREOPERATIVE DIAGNOSIS:  Displaced right hip femoral neck fracture.  POSTOPERATIVE DIAGNOSIS:  Displaced right hip femoral neck fracture.  PROCEDURE:  Right total hip arthroplasty through direct anterior approach.  IMPLANTS:  DeP[employer] Sector Gription and component size 50 with 3 screws, size 32+ 0 polyethylene liner, size 15 Corail femoral component with standard offset, size 32 +9 ceramic hip ball.  SURGEON:  Lind Guest. Ninfa Linden, M.D.  ASSISTANT:  Erskine Emery, PA-C.  ANESTHESIA:  General.  ANTIBIOTICS:  2 g IV Ancef.  BLOOD LOSS:  500 mL.  COMPLICATIONS:  None.  INDICATIONS:  Ms. Palafox is a 74 year old female on Plavix who actually fell at her home this past Monday, which was 3 days ago.  She had significant hip pain and inability to ambulate, but she would not led her husband take her to the hospital.  Finally, today due to continued pain, she has taken the hospital in [facility] ER and found to have a displaced right hip femoral neck fracture.  To treat this fracture given her age of 45, it was recommended she undergo with right total hip arthroplasty direct anterior approach.  The risks and benefits of surgery were explained to her in detail and she did wish to proceed given her severe hip pain and displaced hip fracture.  The risks included acute blood loss anemia, nerve or vessel injury, fracture, infection, dislocation, DVT, and she understands her goals were decreased pain, improved mobility, and overall improved quality of life.  PROCEDURE DESCRIPTION:  After informed consent was obtained, appropriate the right hip was marked.  She was  brought to the operating room, where general anesthesia was obtained while she was on her stretcher.  A Foley catheter was placed and then traction boots were placed on both of her feet.  Next, she was placed supine on the Hana fracture table with perineal post in place and both legs in inline skeletal traction devices, but no traction applied.  Her right operative hip was then prepped and draped with DuraPrep and sterile drapes.  Time-out was called.  She was identified as correct patient, correct right hip.  We then made an incision inferior and posterior to the anterosuperior iliac spine and carried this obliquely down the leg.  We dissected down the tensor fascia lata muscle, tensor fascia was then divided longitudinally to proceed with a direct anterior approach to the hip.  We identified and cauterized the lateral femoral circumflex vessels.  We did note a quite a bit of bleeding in general throughout the case due to her being on Plavix.  We opened up the hip capsule and found a large hematoma from her hip fracture and displaced femoral neck fracture.  We used oscillating saw to make our femoral neck cut proximal to the lesser trochanter and then we used a corkscrew guide in the femoral head, removed  the femoral head in its entirety.  We then cleaned the acetabular rim__________ began reaming from a size 42 reamer up to a size 50 with all reamers under direct visualization __________ fluoroscopy.  Due to the quality of her bone, I did medialize quite a bit and I was able to get the stem and packed the real femoral component, impacted, but I still placed 3 screws to the quality of her bone in general.  I then placed a 32 +0 polyethylene liner.  Attention was then turned to the femur.  The leg externally rotated to 100 degrees, extended and adducted.  We used a Mueller retractor medially and a Hohmann retractor __________ greater trochanter, I released the lateral joint capsule.  We  then used a box cutting osteotome to enter the femoral canal, rongeur lateralized.  We then began broaching up to a size 15, broached due to her osteopenic bone.  We placed the 15 without difficulty, then trialed a 36 +1 hip ball and brought the leg back over of traction and internal rotation, reducing the pelvis and we noted we need to go quite longer to get her stable.  We then placed the real size 15 Corail femoral component and +9 ceramic hip ball __________ pelvis that she was stable and leg lengths were felt to be near equal.  I put her through internal and external rotation with minimal shuck.  We then irrigated the hip with normal saline solution using pulsatile lavage. We closed the joint capsule with interrupted #1 Ethibond suture, followed by a running #1 Vicryl in the tensor fascia, 0 Vicryl in the deep tissue, 2-0 Vicryl in the subcutaneous tissue, interrupted staples on the skin.  Xeroform and Aquacel dressing were applied.  She was taken off the Hana table, awakened, extubated, taken to her room in stable condition.  All final counts were correct.  There were no complications noted.  Postoperatively, we are going to let her weight bear as tolerated, we are going to watch her blood levels closely due to her likely acute blood loss anemia from surgery and being on Plavix.     Lind Guest. Ninfa Linden, M.D.     CYB/MEDQ  D:  05/26/2015  T:  05/26/2015  Job:  [id number]
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
## 2015-05-27 NOTE — Clinical Social Work Note (Signed)
CSW submitted PASRR; currently under manual review. PASRR office closed Friday, 12/23 through Monday, 12/26. It is anticipated that PASRR will be received on 12/27. Pt can not be placed until PASRR is received.   Cindra Presume, LCSW [phone] Hospital [facility] & 5E, 5W [id number] Licensed Clinical Social Worker

## 2015-05-27 NOTE — Clinical Social Work Note (Addendum)
Clinical Social Work Assessment  Patient Details  Name: Valerie Hanna MRN: [id number] Date of Birth: 01-09-1941  Date of referral:  05/27/15               Reason for consult:  Facility Placement                Permission sought to share information with:  Family Supports, Customer service manager, Case Optician, dispensing granted to share information::  Yes, Verbal Permission Granted  Name::        Agency::   ([facility])  Relationship::     Contact Information:     Housing/Transportation Living arrangements for the past 2 months:  [place] of Information:  Patient Patient Interpreter Needed:  None Criminal Activity/Legal Involvement Pertinent to Current Situation/Hospitalization:  No - Comment as needed Significant Relationships:    Lives with:  Spouse Do you feel safe going back to the place where you live?  No (Skilled facility need) Need for family participation in patient care:  No (Coment)  Care giving concerns: No concerns at time of assessment.   Social Worker assessment / plan:  CSW met with pt at bedside. Pt is realistic regarding her current needs and agrees to SNF placement. Pt's first choice is [facility]. Pt expressed concern regarding [facility] transporting her due to cost. Transportation to be determined closer to time of d/c.   Employment status:  Retired Nurse, adult PT Recommendations:  [place] / Referral to community resources:  [place]  Patient/Family's Response to care: Pt is agreeable to SNF placement.   Patient/Family's Understanding of and Emotional Response to Diagnosis, Current Treatment, and Prognosis: Pt is realistic regarding her need for a skilled facility.   Emotional Assessment Appearance:  Appears stated age Attitude/Demeanor/Rapport:   (Appropriate) Affect (typically observed):  Accepting, Adaptable Orientation:   Oriented to Self, Oriented to Place, Oriented to  Time, Oriented to Situation Alcohol / Substance use:  Not Applicable Psych involvement (Current and /or in the community):  No (Comment)  Discharge Needs  Concerns to be addressed:  No discharge needs identified Readmission within the last 30 days:  No Current discharge risk:  None Barriers to Discharge:  Continued Medical Work up   Linna Darner, LCSW 05/27/2015, 2:30 PM

## 2015-05-28 LAB — BASIC METABOLIC PANEL
ANION GAP: 6 (ref 5–15)
CHLORIDE: 100 mmol/L — AB (ref 101–111)
CO2: 26 mmol/L (ref 22–32)
Calcium: 8.1 mg/dL — ABNORMAL LOW (ref 8.9–10.3)
Creatinine, Ser: 0.4 mg/dL — ABNORMAL LOW (ref 0.44–1.00)
GFR calc non Af Amer: >60 mL/min (ref >60)
GLUCOSE: 120 mg/dL — AB (ref 65–99)
POTASSIUM: 4 mmol/L (ref 3.5–5.1)
SODIUM: 132 mmol/L — AB (ref 135–145)

## 2015-05-28 LAB — CBC
HEMATOCRIT: 21 % — AB (ref 36.0–46.0)
HEMOGLOBIN: 7 g/dL — AB (ref 12.0–15.0)
MCH: 35 pg — ABNORMAL HIGH (ref 26.0–34.0)
MCHC: 33.3 g/dL (ref 30.0–36.0)
MCV: 105 fL — ABNORMAL HIGH (ref 78.0–100.0)
Platelets: 237 10*3/uL (ref 150–400)
RBC: 2 MIL/uL — ABNORMAL LOW (ref 3.87–5.11)
RDW: 12.1 % (ref 11.5–15.5)
WBC: 11 10*3/uL — AB (ref 4.0–10.5)

## 2015-05-28 LAB — CK: Total CK: 561 U/L — ABNORMAL HIGH (ref 38–234)

## 2015-05-28 LAB — URINE CULTURE: CULTURE: NO GROWTH

## 2015-05-28 MED ORDER — ACETAMINOPHEN 325 MG PO TABS: 650.0000 mg | ORAL_TABLET | Freq: Once | ORAL | Status: DC

## 2015-05-28 MED ORDER — SODIUM CHLORIDE 0.9 % IV SOLN: Freq: Once | INTRAVENOUS | Status: DC
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
## 2015-05-28 NOTE — Progress Notes (Signed)
Physical Therapy Treatment Patient Details Name: Valerie Hanna MRN: [id number] DOB: 1940-06-10 Today's Date: 05/28/2015    History of Present Illness Pt admitted after 2 recent falls 12/3 and 12/19 with R hip fx s/p R anterior THA. PMHx: ETOH abuse, GERD, CVA, HTN    PT Comments    Patient moving better today.  Still limited by anxiety/fear of falling and still presents with dependencies in mobility and independence.  Agree SNF for discharge and continued PT.  Follow Up Recommendations  SNF     Equipment Recommendations  Rolling walker with 5" wheels    Recommendations for Other Services       Precautions / Restrictions Precautions Precautions: Fall Restrictions RLE Weight Bearing: Weight bearing as tolerated    Mobility  Bed Mobility Overal bed mobility: Needs Assistance Bed Mobility: Supine to Sit     Supine to sit: Min assist;+2 for physical assistance     General bed mobility comments: assist for RLE and to raise trunk off bed for sitting.    Transfers Overall transfer level: Needs assistance Equipment used: Rolling walker (2 wheeled) Transfers: Sit to/from Stand Sit to Stand: +2 physical assistance;Mod assist         General transfer comment: facilitation for proper technique and sequence and to power up.  Ambulation/Gait Ambulation/Gait assistance: +2 safety/equipment;Mod assist Ambulation Distance (Feet): 5 Feet Assistive device: Rolling walker (2 wheeled) Gait Pattern/deviations: Step-to pattern;Antalgic;Shuffle;Decreased stride length;Decreased stance time - right;Leaning posteriorly Gait velocity: decreased   General Gait Details: patient anxious and fearful of falling which limited ambulation   Stairs            Wheelchair Mobility    Modified Rankin (Stroke Patients Only)       Balance Overall balance assessment: Needs assistance Sitting-balance support: Bilateral upper extremity supported Sitting balance-Leahy Scale:  Poor Sitting balance - Comments: required supervision sitting EOB   Standing balance support: Bilateral upper extremity supported Standing balance-Leahy Scale: Poor Standing balance comment: required assistance to maintain balance in standing; posterior lean                    Cognition Arousal/Alertness: Awake/alert Behavior During Therapy: Anxious Overall Cognitive Status: Within Functional Limits for tasks assessed                      Exercises Total Joint Exercises Ankle Circles/Pumps: AROM;Both;Supine;10 reps Short Arc Quad: AROM;Both;10 reps;Supine Hip ABduction/ADduction: AAROM;Both;10 reps;Supine Long Arc Quad: AROM;Both;10 reps;Seated    General Comments        Pertinent Vitals/Pain Pain Assessment: Faces Faces Pain Scale: Hurts even more Pain Location: "all over" but mostly R hip Pain Descriptors / Indicators: Aching;Sore;Discomfort;Grimacing Pain Intervention(s): Limited activity within patient's tolerance;Monitored during session;Premedicated before session    Home Living                      Prior Function            PT Goals (current goals can now be found in the care plan section) Progress towards PT goals: Progressing toward goals    Frequency  Min 3X/week    PT Plan Current plan remains appropriate    Co-evaluation             End of Session Equipment Utilized During Treatment: Gait belt Activity Tolerance: Patient tolerated treatment well (limited by anxiety) Patient left: in chair;with call bell/phone within reach;with chair alarm set     Time: 1011-1040 PT  Time Calculation (min) (ACUTE ONLY): 29 min  Charges:  $Gait Training: 8-22 mins $Therapeutic Exercise: 8-22 mins                    G Codes:      Shanna Cisco 2015-06-18, 11:52 AM  2015-06-18 [place], [place]

## 2015-05-28 NOTE — Progress Notes (Signed)
SW attempted to meet with Pt to provide bed offers.  Pt not able to speak with SW, at this time.  SW to continue to follow.  Bernita Raisin, [facility] [phone]

## 2015-05-28 NOTE — Progress Notes (Signed)
Patient is stable sitting up eating breakfast Dressing dry Compartments soft Recommend transfusion for Hgb 7.0 Up with PT SNF pending F/u 2 weeks Dr. Alma Friendly. Eduard Roux, MD [place] 7:59 AM

## 2015-05-29 LAB — TYPE AND SCREEN
ABO/RH(D): A POS
Antibody Screen: NEGATIVE
UNIT DIVISION: 0
Unit division: 0

## 2015-05-29 LAB — BASIC METABOLIC PANEL
ANION GAP: 8 (ref 5–15)
BUN: 5 mg/dL — ABNORMAL LOW (ref 6–20)
CHLORIDE: 100 mmol/L — AB (ref 101–111)
CO2: 26 mmol/L (ref 22–32)
Calcium: 8.2 mg/dL — ABNORMAL LOW (ref 8.9–10.3)
Creatinine, Ser: 0.34 mg/dL — ABNORMAL LOW (ref 0.44–1.00)
GFR calc Af Amer: >60 mL/min (ref >60)
Glucose, Bld: 121 mg/dL — ABNORMAL HIGH (ref 65–99)
POTASSIUM: 4 mmol/L (ref 3.5–5.1)
SODIUM: 134 mmol/L — AB (ref 135–145)

## 2015-05-29 LAB — CBC
HCT: 27.6 % — ABNORMAL LOW (ref 36.0–46.0)
HEMOGLOBIN: 9.4 g/dL — AB (ref 12.0–15.0)
MCH: 33.5 pg (ref 26.0–34.0)
MCHC: 34.1 g/dL (ref 30.0–36.0)
MCV: 98.2 fL (ref 78.0–100.0)
PLATELETS: 232 10*3/uL (ref 150–400)
RBC: 2.81 MIL/uL — AB (ref 3.87–5.11)
RDW: 18.4 % — ABNORMAL HIGH (ref 11.5–15.5)
WBC: 11.8 10*3/uL — AB (ref 4.0–10.5)

## 2015-05-29 MED ORDER — BISACODYL 10 MG RE SUPP
10.0000 mg | Freq: Every day | RECTAL | Status: DC | PRN
Start: 2015-05-29 — End: 2015-06-01
  Filled 2015-05-29: qty 1
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
## 2015-05-30 LAB — CBC
HCT: 26.8 % — ABNORMAL LOW (ref 36.0–46.0)
HEMOGLOBIN: 8.8 g/dL — AB (ref 12.0–15.0)
MCH: 32.6 pg (ref 26.0–34.0)
MCHC: 32.8 g/dL (ref 30.0–36.0)
MCV: 99.3 fL (ref 78.0–100.0)
Platelets: 267 10*3/uL (ref 150–400)
RBC: 2.7 MIL/uL — ABNORMAL LOW (ref 3.87–5.11)
RDW: 17.2 % — ABNORMAL HIGH (ref 11.5–15.5)
WBC: 7.6 10*3/uL (ref 4.0–10.5)

## 2015-05-30 MED ORDER — FERROUS SULFATE 325 (65 FE) MG PO TABS
325.0000 mg | ORAL_TABLET | Freq: Three times a day (TID) | ORAL | Status: DC
  Administered 2015-05-30 – 2015-06-01 (×6): 325 mg via ORAL
  Filled 2015-05-30 (×6): qty 1
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
## 2015-05-30 NOTE — Discharge Instructions (Signed)
You may put all of your weight on your right hip. Only up with a walker. Leave your current dressing in place an alone until your outpatient follow-up. You can get your current dressing wet daily in the shower.

## 2015-05-30 NOTE — Progress Notes (Signed)
Subjective: 4 Days Post-Op Procedure(s) (LRB): RIGHT BIPOLAR VS TOTAL HIP ANTERIOR APPROACH (Right) Patient reports pain as moderate.  Working very slowly with therapy.  SNF has been recommended I believe.  Does have acute blood loss anemia following surgery and did receive a transfusion. H/H stable.  Objective: Vital signs in last 24 hours: Temp:  [98.6 F (37 C)-98.8 F (37.1 C)] 98.7 F (37.1 C) (12/26 0457) Pulse Rate:  [82-93] 82 (12/26 0457) Resp:  [18-19] 19 (12/26 0457) BP: (122-134)/(62-67) 122/67 mmHg (12/26 0457) SpO2:  [98 %-99 %] 98 % (12/26 0457)  Intake/Output from previous day: 12/25 0701 - 12/26 0700 In: 480 [P.O.:480] Out: -  Intake/Output this shift:     Recent Labs  05/28/15 0314 05/29/15 0315 05/30/15 0415  HGB 7.0* 9.4* 8.8*    Recent Labs  05/29/15 0315 05/30/15 0415  WBC 11.8* 7.6  RBC 2.81* 2.70*  HCT 27.6* 26.8*  PLT 232 267    Recent Labs  05/28/15 0314 05/29/15 0315  NA 132* 134*  K 4.0 4.0  CL 100* 100*  CO2 26 26  BUN <5* 5*  CREATININE 0.40* 0.34*  GLUCOSE 120* 121*  CALCIUM 8.1* 8.2*   No results for input(s): LABPT, INR in the last 72 hours.  Sensation intact distally Intact pulses distally Dorsiflexion/Plantar flexion intact Incision: scant drainage No cellulitis present  Assessment/Plan: 4 Days Post-Op Procedure(s) (LRB): RIGHT BIPOLAR VS TOTAL HIP ANTERIOR APPROACH (Right) Up with therapy - WBAT right hip  Valerie Hanna Y 05/30/2015, 8:22 AM

## 2015-05-31 DIAGNOSIS — K5909 Other constipation: Secondary | ICD-10-CM

## 2015-05-31 DIAGNOSIS — Z789 Other specified health status: Secondary | ICD-10-CM | POA: Diagnosis present

## 2015-05-31 DIAGNOSIS — K5900 Constipation, unspecified: Secondary | ICD-10-CM | POA: Diagnosis present

## 2015-05-31 DIAGNOSIS — Z7289 Other problems related to lifestyle: Secondary | ICD-10-CM | POA: Diagnosis present

## 2015-05-31 LAB — BASIC METABOLIC PANEL
Anion gap: 11 (ref 5–15)
BUN: <5 mg/dL — ABNORMAL LOW (ref 6–20)
CHLORIDE: 94 mmol/L — AB (ref 101–111)
CO2: 25 mmol/L (ref 22–32)
CREATININE: 0.34 mg/dL — AB (ref 0.44–1.00)
Calcium: 8.5 mg/dL — ABNORMAL LOW (ref 8.9–10.3)
GFR calc non Af Amer: >60 mL/min (ref >60)
Glucose, Bld: 102 mg/dL — ABNORMAL HIGH (ref 65–99)
Potassium: 4.2 mmol/L (ref 3.5–5.1)
SODIUM: 130 mmol/L — AB (ref 135–145)

## 2015-05-31 LAB — CBC
HCT: 26.5 % — ABNORMAL LOW (ref 36.0–46.0)
Hemoglobin: 8.9 g/dL — ABNORMAL LOW (ref 12.0–15.0)
MCH: 33.2 pg (ref 26.0–34.0)
MCHC: 33.6 g/dL (ref 30.0–36.0)
MCV: 98.9 fL (ref 78.0–100.0)
PLATELETS: 331 10*3/uL (ref 150–400)
RBC: 2.68 MIL/uL — AB (ref 3.87–5.11)
RDW: 15.8 % — ABNORMAL HIGH (ref 11.5–15.5)
WBC: 12 10*3/uL — AB (ref 4.0–10.5)

## 2015-05-31 MED ORDER — POLYETHYLENE GLYCOL 3350 17 G PO PACK
17.0000 g | PACK | Freq: Every day | ORAL | Status: DC
  Filled 2015-05-31 (×2): qty 1

## 2015-05-31 MED ORDER — ZOLPIDEM TARTRATE 5 MG PO TABS: 5.0000 mg | ORAL_TABLET | Freq: Every evening | ORAL | Status: DC | PRN

## 2015-05-31 MED ORDER — FAMOTIDINE 20 MG PO TABS
20.0000 mg | ORAL_TABLET | Freq: Once | ORAL | Status: AC
  Administered 2015-05-31: 20 mg via ORAL
  Filled 2015-05-31: qty 1

## 2015-05-31 MED ORDER — ZOLPIDEM TARTRATE 5 MG PO TABS
5.0000 mg | ORAL_TABLET | Freq: Every evening | ORAL | Status: DC | PRN
  Administered 2015-05-31: 5 mg via ORAL
  Filled 2015-05-31: qty 1

## 2015-05-31 MED ORDER — BISACODYL 10 MG RE SUPP: 10.0000 mg | Freq: Every day | RECTAL | Status: DC | PRN

## 2015-05-31 MED ORDER — ENSURE ENLIVE PO LIQD: 237.0000 mL | Freq: Two times a day (BID) | ORAL | Status: DC

## 2015-05-31 MED ORDER — SENNOSIDES-DOCUSATE SODIUM 8.6-50 MG PO TABS: 1.0000 | ORAL_TABLET | Freq: Two times a day (BID) | ORAL | Status: DC

## 2015-05-31 MED ORDER — DOCUSATE SODIUM 100 MG PO CAPS: 100.0000 mg | ORAL_CAPSULE | Freq: Two times a day (BID) | ORAL | Status: DC

## 2015-05-31 MED ORDER — POLYETHYLENE GLYCOL 3350 17 G PO PACK: 17.0000 g | PACK | Freq: Every day | ORAL | Status: DC

## 2015-05-31 MED ORDER — FOLIC ACID 1 MG PO TABS: 1.0000 mg | ORAL_TABLET | Freq: Every day | ORAL | Status: DC

## 2015-05-31 MED ORDER — SENNOSIDES-DOCUSATE SODIUM 8.6-50 MG PO TABS
2.0000 | ORAL_TABLET | Freq: Two times a day (BID) | ORAL | Status: DC
  Administered 2015-05-31 – 2015-06-01 (×2): 2 via ORAL
  Filled 2015-05-31 (×3): qty 2

## 2015-05-31 MED ORDER — HYDROCODONE-ACETAMINOPHEN 5-325 MG PO TABS: 1.0000 | ORAL_TABLET | Freq: Four times a day (QID) | ORAL | Status: DC | PRN

## 2015-05-31 MED ORDER — FERROUS SULFATE 325 (65 FE) MG PO TABS: 325.0000 mg | ORAL_TABLET | Freq: Three times a day (TID) | ORAL | Status: DC

## 2015-05-31 MED ORDER — NICOTINE 7 MG/24HR TD PT24: 7.0000 mg | MEDICATED_PATCH | Freq: Every day | TRANSDERMAL | Status: DC

## 2015-05-31 NOTE — Discharge Planning (Signed)
Barrier to discharge: 30 day note on patient's chart for MD signature. RN reports MD paged to sign 30 day note. [facility] ([facility]) remains closed on 05/31/2015. Patient may NOT discharged to SNF without [facility] authorization.  Lubertha Sayres, [facility]: [phone] Surgical: [phone]
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
## 2015-06-01 DIAGNOSIS — S72001D Fracture of unspecified part of neck of right femur, subsequent encounter for closed fracture with routine healing: Secondary | ICD-10-CM

## 2015-06-01 NOTE — Progress Notes (Signed)
Physical Therapy Treatment Patient Details Name: Valerie Hanna MRN: [id number] DOB: 07-18-1940 Today's Date: 06/01/2015    History of Present Illness Pt admitted after 2 recent falls 12/3 and 12/19 with R hip fx s/p R anterior THA. PMHx: ETOH abuse, GERD, CVA, HTN    PT Comments    Pt cooperative but continues ltd by significant tremor and anxiety with mobilization.  Follow Up Recommendations  SNF     Equipment Recommendations  Rolling walker with 5" wheels    Recommendations for Other Services OT consult     Precautions / Restrictions Precautions Precautions: Fall Precaution Comments: Pt very tremulous Restrictions Weight Bearing Restrictions: No RLE Weight Bearing: Weight bearing as tolerated    Mobility  Bed Mobility Overal bed mobility: Needs Assistance Bed Mobility: Supine to Sit     Supine to sit: Min assist     General bed mobility comments: cues for sequence and assist to manage R LE  Transfers Overall transfer level: Needs assistance Equipment used: Rolling walker (2 wheeled) Transfers: Sit to/from Stand Sit to Stand: Min assist;Mod assist         General transfer comment: cues for LE management and use of UEs to self assist.  Physical assist to bring wt fwd and up and to balance in initial standing  Ambulation/Gait Ambulation/Gait assistance: Mod assist;+2 physical assistance;+2 safety/equipment Ambulation Distance (Feet): 9 Feet Assistive device: Rolling walker (2 wheeled) Gait Pattern/deviations: Step-to pattern;Decreased step length - right;Decreased step length - left;Shuffle;Trunk flexed Gait velocity: decreased Gait velocity interpretation: Below normal speed for age/gender General Gait Details: cues for sequence, posture and position from RW.  +2 assist 2* pt anxiety and instability 2* tremor   Stairs            Wheelchair Mobility    Modified Rankin (Stroke Patients Only)       Balance     Sitting balance-Leahy Scale:  Fair     Standing balance support: Bilateral upper extremity supported Standing balance-Leahy Scale: Poor                      Cognition Arousal/Alertness: Awake/alert Behavior During Therapy: Anxious Overall Cognitive Status: Within Functional Limits for tasks assessed                      Exercises Total Joint Exercises Ankle Circles/Pumps: AROM;Both;Supine;10 reps Quad Sets: AROM;10 reps;Right;Supine Heel Slides: AAROM;Right;15 reps;Supine Hip ABduction/ADduction: AAROM;Both;10 reps;Supine    General Comments        Pertinent Vitals/Pain Pain Assessment: 0-10 Pain Score: 6  Pain Location: R hip Pain Descriptors / Indicators: Aching;Sore Pain Intervention(s): Limited activity within patient's tolerance;Monitored during session;Premedicated before session;Ice applied    Home Living                      Prior Function            PT Goals (current goals can now be found in the care plan section) Acute Rehab PT Goals Patient Stated Goal: to return home to care for dogs PT Goal Formulation: With patient Time For Goal Achievement: 06/03/15 Potential to Achieve Goals: Fair Progress towards PT goals: Progressing toward goals    Frequency  Min 3X/week    PT Plan Current plan remains appropriate    Co-evaluation             End of Session Equipment Utilized During Treatment: Gait belt Activity Tolerance: Patient tolerated treatment well;Patient limited by  fatigue Patient left: in chair;with call bell/phone within reach;with chair alarm set;with family/visitor present     Time: [id number] PT Time Calculation (min) (ACUTE ONLY): 26 min  Charges:  $Gait Training: 8-22 mins $Therapeutic Exercise: 8-22 mins                    G Codes:      Valerie Hanna June 29, 2015, 1:23 PM

## 2015-06-01 NOTE — Discharge Planning (Signed)
Patient to be discharged to [facility]. Patient updated regarding discharge.  [facility] authorization: [id number]  Facility: [facility] RN report number: [id number] Transportation: Greig Castilla, [facility]: [phone] Surgical: [phone]

## 2015-06-01 NOTE — Progress Notes (Signed)
Called report to [employer]. Gave report to RN

## 2015-06-01 NOTE — Progress Notes (Signed)
Chart reviewed. VSS and no acute distress or complaints overnight. Medication reconciliation has been reviewed and appropriate. Patient ok for discharge from IM standpoint.  Barton Dubois [phone]

## 2015-06-01 NOTE — Clinical Social Work Placement (Signed)
   CLINICAL SOCIAL WORK PLACEMENT  NOTE  Date:  06/01/2015  Patient Details  Name: Valerie Hanna MRN: [id number] Date of Birth: 1940-11-27  Clinical Social Work is seeking post-discharge placement for this patient at the [place] level of care (*CSW will initial, date and re-position this form in  chart as items are completed):  Yes   Patient/family provided with [place] Work Department's list of facilities offering this level of care within the geographic area requested by the patient (or if unable, by the patient's family).  Yes   Patient/family informed of their freedom to choose among providers that offer the needed level of care, that participate in Medicare, Medicaid or managed care program needed by the patient, have an available bed and are willing to accept the patient.  Yes   Patient/family informed of [facility]'s ownership interest in [facility] and [facility], as well as of the fact that they are under no obligation to receive care at these facilities.  PASRR submitted to EDS on 05/27/15     PASRR number received on       Existing PASRR number confirmed on       FL2 transmitted to all facilities in geographic area requested by pt/family on 05/27/15     FL2 transmitted to all facilities within larger geographic area on       Patient informed that his/her managed care company has contracts with or will negotiate with certain facilities, including the following:        Yes   Patient/family informed of bed offers received.  Patient chooses bed at [facility]     Physician recommends and patient chooses bed at      Patient to be transferred to [facility] on 06/01/15.  Patient to be transferred to facility by [facility]     Patient family notified on 06/01/15 of transfer.  Name of family member notified:  Patient     PHYSICIAN       Additional Comment:     _______________________________________________ Caroline Sauger, LCSW 06/01/2015, 12:58 PM

## 2015-06-01 NOTE — Clinical Social Work Note (Signed)
PASARR received: [id number] Claudean Severance, [facility]: [phone] Surgical: [phone]

## 2015-06-05 DIAGNOSIS — Z96649 Presence of unspecified artificial hip joint: Secondary | ICD-10-CM | POA: Diagnosis not present

## 2015-06-05 DIAGNOSIS — E568 Deficiency of other vitamins: Secondary | ICD-10-CM | POA: Diagnosis not present

## 2015-06-05 DIAGNOSIS — R262 Difficulty in walking, not elsewhere classified: Secondary | ICD-10-CM | POA: Diagnosis not present

## 2015-06-05 DIAGNOSIS — G4700 Insomnia, unspecified: Secondary | ICD-10-CM | POA: Diagnosis not present

## 2015-06-08 ENCOUNTER — Ambulatory Visit: Payer: PPO | Admitting: Podiatry

## 2015-06-13 ENCOUNTER — Inpatient Hospital Stay: Payer: PPO | Admitting: Internal Medicine

## 2015-06-20 DIAGNOSIS — F419 Anxiety disorder, unspecified: Secondary | ICD-10-CM | POA: Diagnosis not present

## 2015-06-20 DIAGNOSIS — F339 Major depressive disorder, recurrent, unspecified: Secondary | ICD-10-CM | POA: Diagnosis not present

## 2015-06-20 DIAGNOSIS — G4700 Insomnia, unspecified: Secondary | ICD-10-CM | POA: Diagnosis not present

## 2015-06-22 DIAGNOSIS — S72041D Displaced fracture of base of neck of right femur, subsequent encounter for closed fracture with routine healing: Secondary | ICD-10-CM | POA: Diagnosis not present

## 2015-06-27 DIAGNOSIS — I10 Essential (primary) hypertension: Secondary | ICD-10-CM | POA: Diagnosis not present

## 2015-06-27 DIAGNOSIS — F419 Anxiety disorder, unspecified: Secondary | ICD-10-CM | POA: Diagnosis not present

## 2015-06-27 DIAGNOSIS — Z8673 Personal history of transient ischemic attack (TIA), and cerebral infarction without residual deficits: Secondary | ICD-10-CM | POA: Diagnosis not present

## 2015-06-27 DIAGNOSIS — Z7901 Long term (current) use of anticoagulants: Secondary | ICD-10-CM | POA: Diagnosis not present

## 2015-06-27 DIAGNOSIS — Z9181 History of falling: Secondary | ICD-10-CM | POA: Diagnosis not present

## 2015-06-27 DIAGNOSIS — Z96641 Presence of right artificial hip joint: Secondary | ICD-10-CM | POA: Diagnosis not present

## 2015-06-27 DIAGNOSIS — S72011D Unspecified intracapsular fracture of right femur, subsequent encounter for closed fracture with routine healing: Secondary | ICD-10-CM | POA: Diagnosis not present

## 2015-06-27 DIAGNOSIS — F329 Major depressive disorder, single episode, unspecified: Secondary | ICD-10-CM | POA: Diagnosis not present

## 2015-06-27 DIAGNOSIS — F17210 Nicotine dependence, cigarettes, uncomplicated: Secondary | ICD-10-CM | POA: Diagnosis not present

## 2015-07-06 DIAGNOSIS — Z7901 Long term (current) use of anticoagulants: Secondary | ICD-10-CM | POA: Diagnosis not present

## 2015-07-06 DIAGNOSIS — Z96641 Presence of right artificial hip joint: Secondary | ICD-10-CM | POA: Diagnosis not present

## 2015-07-06 DIAGNOSIS — F329 Major depressive disorder, single episode, unspecified: Secondary | ICD-10-CM | POA: Diagnosis not present

## 2015-07-06 DIAGNOSIS — Z9181 History of falling: Secondary | ICD-10-CM | POA: Diagnosis not present

## 2015-07-06 DIAGNOSIS — Z8673 Personal history of transient ischemic attack (TIA), and cerebral infarction without residual deficits: Secondary | ICD-10-CM | POA: Diagnosis not present

## 2015-07-06 DIAGNOSIS — F17210 Nicotine dependence, cigarettes, uncomplicated: Secondary | ICD-10-CM | POA: Diagnosis not present

## 2015-07-06 DIAGNOSIS — S72011D Unspecified intracapsular fracture of right femur, subsequent encounter for closed fracture with routine healing: Secondary | ICD-10-CM | POA: Diagnosis not present

## 2015-07-06 DIAGNOSIS — I10 Essential (primary) hypertension: Secondary | ICD-10-CM | POA: Diagnosis not present

## 2015-07-06 DIAGNOSIS — F419 Anxiety disorder, unspecified: Secondary | ICD-10-CM | POA: Diagnosis not present

## 2015-07-10 ENCOUNTER — Other Ambulatory Visit: Payer: Self-pay | Admitting: Internal Medicine

## 2015-07-14 ENCOUNTER — Other Ambulatory Visit: Payer: Self-pay | Admitting: Internal Medicine

## 2015-07-15 ENCOUNTER — Other Ambulatory Visit: Payer: Self-pay | Admitting: Internal Medicine

## 2015-07-15 ENCOUNTER — Encounter (INDEPENDENT_AMBULATORY_CARE_PROVIDER_SITE_OTHER): Payer: PPO | Admitting: Internal Medicine

## 2015-07-15 DIAGNOSIS — F411 Generalized anxiety disorder: Secondary | ICD-10-CM

## 2015-07-15 DIAGNOSIS — S72001S Fracture of unspecified part of neck of right femur, sequela: Secondary | ICD-10-CM

## 2015-07-15 DIAGNOSIS — F324 Major depressive disorder, single episode, in partial remission: Secondary | ICD-10-CM

## 2015-07-15 NOTE — Progress Notes (Signed)
Patient ID: Valerie Hanna, female   DOB: 1940-08-13, 75 y.o.   MRN: [id number]  Orders received today for home health care. [facility] - start of care 06/27/2015 through [id number], initial certification. Orders are reviewed signed and faxed.

## 2015-07-20 ENCOUNTER — Ambulatory Visit (INDEPENDENT_AMBULATORY_CARE_PROVIDER_SITE_OTHER): Payer: PPO | Admitting: Internal Medicine

## 2015-07-20 ENCOUNTER — Encounter: Payer: Self-pay | Admitting: Internal Medicine

## 2015-07-20 VITALS — BP 124/72 | HR 76 | Ht 63.0 in | Wt 122.4 lb

## 2015-07-20 DIAGNOSIS — B354 Tinea corporis: Secondary | ICD-10-CM | POA: Diagnosis not present

## 2015-07-20 DIAGNOSIS — F411 Generalized anxiety disorder: Secondary | ICD-10-CM

## 2015-07-20 DIAGNOSIS — E785 Hyperlipidemia, unspecified: Secondary | ICD-10-CM | POA: Diagnosis not present

## 2015-07-20 DIAGNOSIS — F324 Major depressive disorder, single episode, in partial remission: Secondary | ICD-10-CM | POA: Diagnosis not present

## 2015-07-20 DIAGNOSIS — M4850XD Collapsed vertebra, not elsewhere classified, site unspecified, subsequent encounter for fracture with routine healing: Secondary | ICD-10-CM | POA: Diagnosis not present

## 2015-07-20 DIAGNOSIS — D62 Acute posthemorrhagic anemia: Secondary | ICD-10-CM | POA: Diagnosis not present

## 2015-07-20 DIAGNOSIS — I69359 Hemiplegia and hemiparesis following cerebral infarction affecting unspecified side: Secondary | ICD-10-CM

## 2015-07-20 DIAGNOSIS — S72001S Fracture of unspecified part of neck of right femur, sequela: Secondary | ICD-10-CM | POA: Diagnosis not present

## 2015-07-20 DIAGNOSIS — K2270 Barrett's esophagus without dysplasia: Secondary | ICD-10-CM | POA: Diagnosis not present

## 2015-07-20 DIAGNOSIS — G4700 Insomnia, unspecified: Secondary | ICD-10-CM | POA: Diagnosis not present

## 2015-07-20 DIAGNOSIS — IMO0001 Reserved for inherently not codable concepts without codable children: Secondary | ICD-10-CM

## 2015-07-20 MED ORDER — NYSTATIN-TRIAMCINOLONE 100000-0.1 UNIT/GM-% EX OINT: 1.0000 "application " | TOPICAL_OINTMENT | Freq: Two times a day (BID) | CUTANEOUS | Status: DC

## 2015-07-20 MED ORDER — ALPRAZOLAM 0.25 MG PO TABS: 0.2500 mg | ORAL_TABLET | Freq: Every evening | ORAL | Status: DC | PRN

## 2015-07-20 MED ORDER — ZOLPIDEM TARTRATE 5 MG PO TABS
5.0000 mg | ORAL_TABLET | Freq: Every evening | ORAL | Status: DC | PRN
Start: 2015-07-20 — End: 2015-10-28

## 2015-07-20 NOTE — Patient Instructions (Addendum)
Health Maintenance  Topic Date Due  . MAMMOGRAM  06/04/2018 (Originally 03/05/2014)  . INFLUENZA VACCINE  06/05/2023 (Originally 01/03/2015)  . ZOSTAVAX  06/05/2023 (Originally 01/16/2003)  . PNA vac Low Risk Adult (2 of 2 - PPSV23) 12/01/2015  . COLONOSCOPY  06/20/2021  . TETANUS/TDAP  11/30/2024   Breast Self-Awareness Practicing breast self-awareness may pick up problems early, prevent significant medical complications, and possibly save your life. By practicing breast self-awareness, you can become familiar with how your breasts look and feel and if your breasts are changing. This allows you to notice changes early. It can also offer you some reassurance that your breast health is good. One way to learn what is normal for your breasts and whether your breasts are changing is to do a breast self-exam. If you find a lump or something that was not present in the past, it is best to contact your caregiver right away. Other findings that should be evaluated by your caregiver include nipple discharge, especially if it is bloody; skin changes or reddening; areas where the skin seems to be pulled in (retracted); or new lumps and bumps. Breast pain is seldom associated with cancer (malignancy), but should also be evaluated by a caregiver. HOW TO PERFORM A BREAST SELF-EXAM The best time to examine your breasts is 5-7 days after your menstrual period is over. During menstruation, the breasts are lumpier, and it may be more difficult to pick up changes. If you do not menstruate, have reached menopause, or had your uterus removed (hysterectomy), you should examine your breasts at regular intervals, such as monthly. If you are breastfeeding, examine your breasts after a feeding or after using a breast pump. Breast implants do not decrease the risk for lumps or tumors, so continue to perform breast self-exams as recommended. Talk to your caregiver about how to determine the difference between the implant and breast  tissue. Also, talk about the amount of pressure you should use during the exam. Over time, you will become more familiar with the variations of your breasts and more comfortable with the exam. A breast self-exam requires you to remove all your clothes above the waist. 1. Look at your breasts and nipples. Stand in front of a mirror in a room with good lighting. With your hands on your hips, push your hands firmly downward. Look for a difference in shape, contour, and size from one breast to the other (asymmetry). Asymmetry includes puckers, dips, or bumps. Also, look for skin changes, such as reddened or scaly areas on the breasts. Look for nipple changes, such as discharge, dimpling, repositioning, or redness. 2. Carefully feel your breasts. This is best done either in the shower or tub while using soapy water or when flat on your back. Place the arm (on the side of the breast you are examining) above your head. Use the pads (not the fingertips) of your three middle fingers on your opposite hand to feel your breasts. Start in the underarm area and use  inch (2 cm) overlapping circles to feel your breast. Use 3 different levels of pressure (light, medium, and firm pressure) at each circle before moving to the next circle. The light pressure is needed to feel the tissue closest to the skin. The medium pressure will help to feel breast tissue a little deeper, while the firm pressure is needed to feel the tissue close to the ribs. Continue the overlapping circles, moving downward over the breast until you feel your ribs below your breast. Then,  move one finger-width towards the center of the body. Continue to use the  inch (2 cm) overlapping circles to feel your breast as you move slowly up toward the collar bone (clavicle) near the base of the neck. Continue the up and down exam using all 3 pressures until you reach the middle of the chest. Do this with each breast, carefully feeling for lumps or changes. 3.   Keep a written record with breast changes or normal findings for each breast. By writing this information down, you do not need to depend only on memory for size, tenderness, or location. Write down where you are in your menstrual cycle, if you are still menstruating. Breast tissue can have some lumps or thick tissue. However, see your caregiver if you find anything that concerns you.  SEEK MEDICAL CARE IF:  You see a change in shape, contour, or size of your breasts or nipples.   You see skin changes, such as reddened or scaly areas on the breasts or nipples.   You have an unusual discharge from your nipples.   You feel a new lump or unusually thick areas.    This information is not intended to replace advice given to you by your health care provider. Make sure you discuss any questions you have with your health care provider.   Document Released: 05/21/2005 Document Revised: 05/07/2012 Document Reviewed: 09/05/2011 Elsevier Interactive Patient Education [employer].
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
## 2015-07-21 ENCOUNTER — Other Ambulatory Visit: Payer: Self-pay | Admitting: Internal Medicine

## 2015-07-21 ENCOUNTER — Telehealth: Payer: Self-pay

## 2015-07-21 LAB — CBC WITH DIFFERENTIAL/PLATELET
Basophils Absolute: 0.1 10*3/uL (ref 0.0–0.2)
Basos: 1 %
EOS (ABSOLUTE): 0.3 10*3/uL (ref 0.0–0.4)
EOS: 5 %
HEMATOCRIT: 40.9 % (ref 34.0–46.6)
Hemoglobin: 12.9 g/dL (ref 11.1–15.9)
Immature Grans (Abs): 0 10*3/uL (ref 0.0–0.1)
Immature Granulocytes: 0 %
LYMPHS ABS: 2.5 10*3/uL (ref 0.7–3.1)
Lymphs: 36 %
MCH: 30.7 pg (ref 26.6–33.0)
MCHC: 31.5 g/dL (ref 31.5–35.7)
MCV: 97 fL (ref 79–97)
MONOS ABS: 0.6 10*3/uL (ref 0.1–0.9)
Monocytes: 9 %
Neutrophils Absolute: 3.5 10*3/uL (ref 1.4–7.0)
Neutrophils: 49 %
PLATELETS: 333 10*3/uL (ref 150–379)
RBC: 4.2 x10E6/uL (ref 3.77–5.28)
RDW: 16.4 % — AB (ref 12.3–15.4)
WBC: 7 10*3/uL (ref 3.4–10.8)

## 2015-07-21 LAB — LIPID PANEL
Chol/HDL Ratio: 2.6 ratio units (ref 0.0–4.4)
Cholesterol, Total: 201 mg/dL — ABNORMAL HIGH (ref 100–199)
HDL: 76 mg/dL (ref >39)
LDL Calculated: 98 mg/dL (ref 0–99)
Triglycerides: 134 mg/dL (ref 0–149)
VLDL Cholesterol Cal: 27 mg/dL (ref 5–40)

## 2015-07-21 LAB — COMPREHENSIVE METABOLIC PANEL WITH GFR
ALT: 11 IU/L (ref 0–32)
AST: 23 IU/L (ref 0–40)
Albumin/Globulin Ratio: 1.6 (ref 1.1–2.5)
Albumin: 4.2 g/dL (ref 3.5–4.8)
Alkaline Phosphatase: 122 IU/L — ABNORMAL HIGH (ref 39–117)
BUN/Creatinine Ratio: 18 (ref 11–26)
BUN: 12 mg/dL (ref 8–27)
Bilirubin Total: 0.3 mg/dL (ref 0.0–1.2)
CO2: 23 mmol/L (ref 18–29)
Calcium: 9.5 mg/dL (ref 8.7–10.3)
Chloride: 97 mmol/L (ref 96–106)
Creatinine, Ser: 0.68 mg/dL (ref 0.57–1.00)
GFR calc Af Amer: 101 mL/min/1.73
GFR calc non Af Amer: 88 mL/min/1.73
Globulin, Total: 2.7 g/dL (ref 1.5–4.5)
Glucose: 75 mg/dL (ref 65–99)
Potassium: 4.6 mmol/L (ref 3.5–5.2)
Sodium: 138 mmol/L (ref 134–144)
Total Protein: 6.9 g/dL (ref 6.0–8.5)

## 2015-07-21 LAB — TSH: TSH: 3.2 u[IU]/mL (ref 0.450–4.500)

## 2015-07-21 MED ORDER — NYSTATIN 100000 UNIT/GM EX CREA: 1.0000 "application " | TOPICAL_CREAM | Freq: Two times a day (BID) | CUTANEOUS | Status: DC

## 2015-07-21 NOTE — Telephone Encounter (Signed)
-----   Message from Glean Hess, MD sent at 07/21/2015  1:11 PM EST ----- Anemia has resolved.  All other labs are normal.

## 2015-07-21 NOTE — Telephone Encounter (Signed)
Left message for patient to call back  

## 2015-07-21 NOTE — Telephone Encounter (Signed)
Spoke with patient. Patient advised of all results and verbalized understanding. Will call back with any future questions or concerns. MAH  

## 2015-07-25 DIAGNOSIS — Z7901 Long term (current) use of anticoagulants: Secondary | ICD-10-CM | POA: Diagnosis not present

## 2015-07-25 DIAGNOSIS — S72011D Unspecified intracapsular fracture of right femur, subsequent encounter for closed fracture with routine healing: Secondary | ICD-10-CM | POA: Diagnosis not present

## 2015-07-25 DIAGNOSIS — Z8673 Personal history of transient ischemic attack (TIA), and cerebral infarction without residual deficits: Secondary | ICD-10-CM | POA: Diagnosis not present

## 2015-07-25 DIAGNOSIS — F329 Major depressive disorder, single episode, unspecified: Secondary | ICD-10-CM | POA: Diagnosis not present

## 2015-07-25 DIAGNOSIS — F419 Anxiety disorder, unspecified: Secondary | ICD-10-CM | POA: Diagnosis not present

## 2015-07-25 DIAGNOSIS — Z9181 History of falling: Secondary | ICD-10-CM | POA: Diagnosis not present

## 2015-07-25 DIAGNOSIS — Z96641 Presence of right artificial hip joint: Secondary | ICD-10-CM | POA: Diagnosis not present

## 2015-07-25 DIAGNOSIS — I10 Essential (primary) hypertension: Secondary | ICD-10-CM | POA: Diagnosis not present

## 2015-07-25 DIAGNOSIS — F17210 Nicotine dependence, cigarettes, uncomplicated: Secondary | ICD-10-CM | POA: Diagnosis not present

## 2015-08-03 DIAGNOSIS — S72011D Unspecified intracapsular fracture of right femur, subsequent encounter for closed fracture with routine healing: Secondary | ICD-10-CM | POA: Diagnosis not present

## 2015-08-03 DIAGNOSIS — F419 Anxiety disorder, unspecified: Secondary | ICD-10-CM | POA: Diagnosis not present

## 2015-08-03 DIAGNOSIS — I10 Essential (primary) hypertension: Secondary | ICD-10-CM | POA: Diagnosis not present

## 2015-08-03 DIAGNOSIS — F329 Major depressive disorder, single episode, unspecified: Secondary | ICD-10-CM | POA: Diagnosis not present

## 2015-08-03 DIAGNOSIS — Z7901 Long term (current) use of anticoagulants: Secondary | ICD-10-CM | POA: Diagnosis not present

## 2015-08-03 DIAGNOSIS — F17210 Nicotine dependence, cigarettes, uncomplicated: Secondary | ICD-10-CM | POA: Diagnosis not present

## 2015-08-03 DIAGNOSIS — Z8673 Personal history of transient ischemic attack (TIA), and cerebral infarction without residual deficits: Secondary | ICD-10-CM | POA: Diagnosis not present

## 2015-08-03 DIAGNOSIS — Z96641 Presence of right artificial hip joint: Secondary | ICD-10-CM | POA: Diagnosis not present

## 2015-08-03 DIAGNOSIS — Z9181 History of falling: Secondary | ICD-10-CM | POA: Diagnosis not present

## 2015-08-06 ENCOUNTER — Other Ambulatory Visit: Payer: Self-pay | Admitting: Internal Medicine

## 2015-08-08 ENCOUNTER — Other Ambulatory Visit: Payer: Self-pay | Admitting: Internal Medicine

## 2015-08-25 ENCOUNTER — Other Ambulatory Visit: Payer: Self-pay | Admitting: Internal Medicine

## 2015-10-12 ENCOUNTER — Other Ambulatory Visit: Payer: Self-pay | Admitting: Internal Medicine

## 2015-10-28 ENCOUNTER — Other Ambulatory Visit: Payer: Self-pay | Admitting: Internal Medicine

## 2015-10-28 MED ORDER — TRAZODONE HCL 50 MG PO TABS: 50.0000 mg | ORAL_TABLET | Freq: Every day | ORAL | Status: DC

## 2015-11-21 ENCOUNTER — Other Ambulatory Visit: Payer: Self-pay | Admitting: Internal Medicine

## 2015-11-21 DIAGNOSIS — F411 Generalized anxiety disorder: Secondary | ICD-10-CM

## 2015-11-21 MED ORDER — ALPRAZOLAM 0.25 MG PO TABS
0.2500 mg | ORAL_TABLET | Freq: Every evening | ORAL | Status: DC | PRN
Start: 2015-11-21 — End: 2016-03-22

## 2015-11-21 NOTE — Telephone Encounter (Signed)
Wants Rx on Xanax and Trazadone

## 2016-01-09 ENCOUNTER — Other Ambulatory Visit: Payer: Self-pay | Admitting: Internal Medicine

## 2016-01-09 NOTE — Telephone Encounter (Signed)
Patient is scheduled on 8/16 at 145 pm for 6 month follow up

## 2016-01-18 ENCOUNTER — Ambulatory Visit (INDEPENDENT_AMBULATORY_CARE_PROVIDER_SITE_OTHER): Payer: PPO | Admitting: Internal Medicine

## 2016-01-18 ENCOUNTER — Encounter: Payer: Self-pay | Admitting: Internal Medicine

## 2016-01-18 ENCOUNTER — Other Ambulatory Visit: Payer: Self-pay | Admitting: Internal Medicine

## 2016-01-18 ENCOUNTER — Telehealth: Payer: Self-pay

## 2016-01-18 VITALS — BP 120/81 | HR 92 | Resp 16 | Ht 62.0 in | Wt 130.0 lb

## 2016-01-18 DIAGNOSIS — F324 Major depressive disorder, single episode, in partial remission: Secondary | ICD-10-CM

## 2016-01-18 DIAGNOSIS — Z789 Other specified health status: Secondary | ICD-10-CM

## 2016-01-18 DIAGNOSIS — E785 Hyperlipidemia, unspecified: Secondary | ICD-10-CM | POA: Diagnosis not present

## 2016-01-18 DIAGNOSIS — Z7289 Other problems related to lifestyle: Secondary | ICD-10-CM

## 2016-01-18 DIAGNOSIS — F411 Generalized anxiety disorder: Secondary | ICD-10-CM

## 2016-01-18 DIAGNOSIS — I69359 Hemiplegia and hemiparesis following cerebral infarction affecting unspecified side: Secondary | ICD-10-CM

## 2016-01-18 DIAGNOSIS — S72001S Fracture of unspecified part of neck of right femur, sequela: Secondary | ICD-10-CM

## 2016-01-18 MED ORDER — MIRTAZAPINE 30 MG PO TABS: 30.0000 mg | ORAL_TABLET | Freq: Every day | ORAL | 5 refills | Status: DC

## 2016-01-18 MED ORDER — SIMVASTATIN 40 MG PO TABS: 40.0000 mg | ORAL_TABLET | Freq: Every day | ORAL | 5 refills | Status: DC

## 2016-01-18 MED ORDER — SERTRALINE HCL 100 MG PO TABS: 100.0000 mg | ORAL_TABLET | Freq: Every day | ORAL | 5 refills | Status: DC
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
## 2016-01-25 NOTE — Telephone Encounter (Signed)
completed

## 2016-02-16 ENCOUNTER — Other Ambulatory Visit: Payer: Self-pay | Admitting: Internal Medicine

## 2016-02-18 DIAGNOSIS — R1032 Left lower quadrant pain: Secondary | ICD-10-CM | POA: Diagnosis not present

## 2016-02-19 ENCOUNTER — Emergency Department: Payer: PPO

## 2016-02-19 ENCOUNTER — Emergency Department
Admission: EM | Admit: 2016-02-19 | Discharge: 2016-02-19 | Disposition: A | Discharge status: Home / Self Care | Payer: PPO | Attending: Emergency Medicine | Admitting: Emergency Medicine

## 2016-02-19 DIAGNOSIS — R079 Chest pain, unspecified: Secondary | ICD-10-CM | POA: Diagnosis not present

## 2016-02-19 DIAGNOSIS — F17210 Nicotine dependence, cigarettes, uncomplicated: Secondary | ICD-10-CM | POA: Diagnosis not present

## 2016-02-19 DIAGNOSIS — R1032 Left lower quadrant pain: Secondary | ICD-10-CM | POA: Diagnosis not present

## 2016-02-19 DIAGNOSIS — Z85820 Personal history of malignant melanoma of skin: Secondary | ICD-10-CM | POA: Insufficient documentation

## 2016-02-19 DIAGNOSIS — R197 Diarrhea, unspecified: Secondary | ICD-10-CM | POA: Insufficient documentation

## 2016-02-19 DIAGNOSIS — R1084 Generalized abdominal pain: Secondary | ICD-10-CM | POA: Diagnosis not present

## 2016-02-19 DIAGNOSIS — Z79899 Other long term (current) drug therapy: Secondary | ICD-10-CM | POA: Insufficient documentation

## 2016-02-19 DIAGNOSIS — K5730 Diverticulosis of large intestine without perforation or abscess without bleeding: Secondary | ICD-10-CM | POA: Diagnosis not present

## 2016-02-19 DIAGNOSIS — I10 Essential (primary) hypertension: Secondary | ICD-10-CM | POA: Diagnosis not present

## 2016-02-19 DIAGNOSIS — M546 Pain in thoracic spine: Secondary | ICD-10-CM | POA: Insufficient documentation

## 2016-02-19 LAB — COMPREHENSIVE METABOLIC PANEL
ALT: 17 U/L (ref 14–54)
ANION GAP: 13 (ref 5–15)
AST: 19 U/L (ref 15–41)
Albumin: 3.8 g/dL (ref 3.5–5.0)
Alkaline Phosphatase: 84 U/L (ref 38–126)
BILIRUBIN TOTAL: 0.3 mg/dL (ref 0.3–1.2)
BUN: 8 mg/dL (ref 6–20)
CHLORIDE: 101 mmol/L (ref 101–111)
CO2: 19 mmol/L — ABNORMAL LOW (ref 22–32)
Calcium: 9.4 mg/dL (ref 8.9–10.3)
Creatinine, Ser: 0.38 mg/dL — ABNORMAL LOW (ref 0.44–1.00)
GFR calc Af Amer: >60 mL/min (ref >60)
Glucose, Bld: 116 mg/dL — ABNORMAL HIGH (ref 65–99)
POTASSIUM: 4 mmol/L (ref 3.5–5.1)
Sodium: 133 mmol/L — ABNORMAL LOW (ref 135–145)
TOTAL PROTEIN: 7.1 g/dL (ref 6.5–8.1)

## 2016-02-19 LAB — CBC WITH DIFFERENTIAL/PLATELET
BASOS ABS: 0 10*3/uL (ref 0–0.1)
BASOS PCT: 1 %
EOS PCT: 1 %
Eosinophils Absolute: 0.1 10*3/uL (ref 0–0.7)
HEMATOCRIT: 40.4 % (ref 35.0–47.0)
Hemoglobin: 14 g/dL (ref 12.0–16.0)
Lymphocytes Relative: 19 %
Lymphs Abs: 1.5 10*3/uL (ref 1.0–3.6)
MCH: 36.1 pg — ABNORMAL HIGH (ref 26.0–34.0)
MCHC: 34.6 g/dL (ref 32.0–36.0)
MCV: 104.3 fL — AB (ref 80.0–100.0)
MONO ABS: 0.7 10*3/uL (ref 0.2–0.9)
MONOS PCT: 9 %
Neutro Abs: 5.8 10*3/uL (ref 1.4–6.5)
Neutrophils Relative %: 70 %
PLATELETS: 343 10*3/uL (ref 150–440)
RBC: 3.88 MIL/uL (ref 3.80–5.20)
RDW: 12.7 % (ref 11.5–14.5)
WBC: 8.1 10*3/uL (ref 3.6–11.0)

## 2016-02-19 LAB — URINALYSIS COMPLETE WITH MICROSCOPIC (ARMC ONLY)
BILIRUBIN URINE: NEGATIVE
Glucose, UA: NEGATIVE mg/dL
KETONES UR: NEGATIVE mg/dL
LEUKOCYTES UA: NEGATIVE
NITRITE: NEGATIVE
PH: 6 (ref 5.0–8.0)
PROTEIN: NEGATIVE mg/dL
SPECIFIC GRAVITY, URINE: 1.005 (ref 1.005–1.030)

## 2016-02-19 LAB — LIPASE, BLOOD: LIPASE: 17 U/L (ref 11–51)

## 2016-02-19 LAB — ETHANOL: Alcohol, Ethyl (B): 116 mg/dL — ABNORMAL HIGH (ref <5)

## 2016-02-19 MED ORDER — LIDOCAINE 5 % EX PTCH: 1.0000 | MEDICATED_PATCH | CUTANEOUS | 0 refills | Status: DC

## 2016-02-19 MED ORDER — LORAZEPAM 2 MG/ML IJ SOLN
1.0000 mg | Freq: Once | INTRAMUSCULAR | Status: AC
  Administered 2016-02-19: 1 mg via INTRAVENOUS
  Filled 2016-02-19: qty 1

## 2016-02-19 MED ORDER — IOPAMIDOL (ISOVUE-300) INJECTION 61%
30.0000 mL | Freq: Once | INTRAVENOUS | Status: AC | PRN
  Administered 2016-02-19: 30 mL via ORAL

## 2016-02-19 MED ORDER — IOPAMIDOL (ISOVUE-300) INJECTION 61%
100.0000 mL | Freq: Once | INTRAVENOUS | Status: AC | PRN
  Administered 2016-02-19: 100 mL via INTRAVENOUS

## 2016-02-19 MED ORDER — ONDANSETRON HCL 4 MG/2ML IJ SOLN
4.0000 mg | Freq: Once | INTRAMUSCULAR | Status: AC
  Administered 2016-02-19: 4 mg via INTRAVENOUS
  Filled 2016-02-19: qty 2

## 2016-02-19 MED ORDER — ACETAMINOPHEN 325 MG PO TABS
650.0000 mg | ORAL_TABLET | Freq: Once | ORAL | Status: AC
  Administered 2016-02-19: 650 mg via ORAL
  Filled 2016-02-19: qty 2

## 2016-02-19 MED ORDER — HYDROMORPHONE HCL 1 MG/ML IJ SOLN
0.5000 mg | Freq: Once | INTRAMUSCULAR | Status: AC
  Administered 2016-02-19: 0.5 mg via INTRAVENOUS
  Filled 2016-02-19: qty 1

## 2016-02-19 MED ORDER — LIDOCAINE 5 % EX PTCH
1.0000 | MEDICATED_PATCH | CUTANEOUS | Status: DC
  Administered 2016-02-19: 1 via TRANSDERMAL
  Filled 2016-02-19: qty 1

## 2016-02-19 MED ORDER — SODIUM CHLORIDE 0.9 % IV BOLUS (SEPSIS)
500.0000 mL | Freq: Once | INTRAVENOUS | Status: AC
  Administered 2016-02-19: 500 mL via INTRAVENOUS

## 2016-02-19 NOTE — ED Notes (Signed)
One and a half weeks of lower left quadrant pain with foul smelling urine reported per patient. 12 lead unremarkable. 156/87, 82p 96% RA. Denies N/V but admits to diarrhea "a lot today". Alert and oriented x 3. Three glasses of wine tonight, which is her normal. 18g right wrist.
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
## 2016-02-19 NOTE — ED Notes (Signed)
Patient transported to X-ray 

## 2016-02-19 NOTE — ED Notes (Signed)
CT called pt finished contrast

## 2016-02-19 NOTE — Discharge Instructions (Signed)
Please return immediately if condition worsens. Please contact her primary physician or the physician you were given for referral. If you have any specialist physicians involved in her treatment and plan please also contact them. Thank you for using [facility] emergency [facility]. ° °

## 2016-02-19 NOTE — ED Triage Notes (Signed)
See blank note for EMS report. Patient is alert and oriented complaining of LLQ pain. Patient is able to give account of complaints. Denies N/V but admits to diarrhea and states it is "black". States dark brown to provider who is at bedside. Denies fever but states "cold today."

## 2016-02-24 ENCOUNTER — Encounter: Payer: Self-pay | Admitting: Internal Medicine

## 2016-02-24 ENCOUNTER — Ambulatory Visit (INDEPENDENT_AMBULATORY_CARE_PROVIDER_SITE_OTHER): Payer: PPO | Admitting: Internal Medicine

## 2016-02-24 VITALS — BP 126/68 | HR 86 | Resp 16 | Ht 63.0 in | Wt 130.0 lb

## 2016-02-24 DIAGNOSIS — M4850XS Collapsed vertebra, not elsewhere classified, site unspecified, sequela of fracture: Secondary | ICD-10-CM | POA: Diagnosis not present

## 2016-02-24 DIAGNOSIS — IMO0001 Reserved for inherently not codable concepts without codable children: Secondary | ICD-10-CM

## 2016-02-24 MED ORDER — TRAMADOL HCL 50 MG PO TABS: 50.0000 mg | ORAL_TABLET | Freq: Three times a day (TID) | ORAL | 0 refills | Status: DC | PRN
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
## 2016-03-07 ENCOUNTER — Other Ambulatory Visit: Payer: Self-pay | Admitting: Internal Medicine

## 2016-03-07 DIAGNOSIS — IMO0001 Reserved for inherently not codable concepts without codable children: Secondary | ICD-10-CM

## 2016-03-07 DIAGNOSIS — M4850XS Collapsed vertebra, not elsewhere classified, site unspecified, sequela of fracture: Principal | ICD-10-CM

## 2016-03-12 DIAGNOSIS — M545 Low back pain: Secondary | ICD-10-CM | POA: Diagnosis not present

## 2016-03-12 DIAGNOSIS — Z681 Body mass index (BMI) 19 or less, adult: Secondary | ICD-10-CM | POA: Diagnosis not present

## 2016-03-19 ENCOUNTER — Telehealth: Payer: Self-pay

## 2016-03-19 ENCOUNTER — Encounter: Payer: Self-pay | Admitting: Internal Medicine

## 2016-03-19 NOTE — Telephone Encounter (Signed)
Needs letter get out of [place] please.

## 2016-03-19 NOTE — Telephone Encounter (Signed)
Letter written and ready to be picked up or mailed to patient.

## 2016-03-22 ENCOUNTER — Other Ambulatory Visit: Payer: Self-pay | Admitting: Internal Medicine

## 2016-03-22 DIAGNOSIS — F411 Generalized anxiety disorder: Secondary | ICD-10-CM

## 2016-03-22 MED ORDER — ALPRAZOLAM 0.25 MG PO TABS: 0.2500 mg | ORAL_TABLET | Freq: Every evening | ORAL | 5 refills | Status: DC | PRN

## 2016-03-23 ENCOUNTER — Other Ambulatory Visit: Payer: Self-pay | Admitting: Neurosurgery

## 2016-03-23 DIAGNOSIS — M5440 Lumbago with sciatica, unspecified side: Secondary | ICD-10-CM

## 2016-03-27 ENCOUNTER — Other Ambulatory Visit: Payer: Self-pay | Admitting: Internal Medicine

## 2016-03-27 ENCOUNTER — Telehealth: Payer: Self-pay | Admitting: Internal Medicine

## 2016-03-27 DIAGNOSIS — IMO0001 Reserved for inherently not codable concepts without codable children: Secondary | ICD-10-CM

## 2016-03-27 DIAGNOSIS — M4850XS Collapsed vertebra, not elsewhere classified, site unspecified, sequela of fracture: Principal | ICD-10-CM

## 2016-03-27 MED ORDER — TRAMADOL HCL 50 MG PO TABS: 50.0000 mg | ORAL_TABLET | Freq: Three times a day (TID) | ORAL | 0 refills | Status: DC | PRN

## 2016-03-27 NOTE — Telephone Encounter (Signed)
Tramadol sent. 

## 2016-03-27 NOTE — Telephone Encounter (Signed)
Patient called and stated she has severe pain in lower back. Patient is wanting a pain medication called into her pharmacy, [facility] in [place]. Please advise.

## 2016-04-02 ENCOUNTER — Other Ambulatory Visit: Payer: Self-pay | Admitting: Internal Medicine

## 2016-04-05 ENCOUNTER — Ambulatory Visit: Payer: PPO

## 2016-04-07 ENCOUNTER — Ambulatory Visit (HOSPITAL_COMMUNITY)
Admission: RE | Admit: 2016-04-07 | Discharge: 2016-04-07 | Disposition: A | Discharge status: Home / Self Care | Payer: PPO | Source: Ambulatory Visit | Attending: Neurosurgery | Admitting: Neurosurgery

## 2016-04-07 DIAGNOSIS — M545 Low back pain: Secondary | ICD-10-CM | POA: Diagnosis not present

## 2016-04-07 DIAGNOSIS — M5440 Lumbago with sciatica, unspecified side: Secondary | ICD-10-CM

## 2016-04-07 DIAGNOSIS — S22060A Wedge compression fracture of T7-T8 vertebra, initial encounter for closed fracture: Secondary | ICD-10-CM | POA: Diagnosis not present

## 2016-04-07 DIAGNOSIS — M4856XA Collapsed vertebra, not elsewhere classified, lumbar region, initial encounter for fracture: Secondary | ICD-10-CM | POA: Insufficient documentation

## 2016-04-07 DIAGNOSIS — M4854XA Collapsed vertebra, not elsewhere classified, thoracic region, initial encounter for fracture: Secondary | ICD-10-CM | POA: Diagnosis not present

## 2016-04-07 DIAGNOSIS — M5136 Other intervertebral disc degeneration, lumbar region: Secondary | ICD-10-CM | POA: Insufficient documentation

## 2016-04-07 DIAGNOSIS — M48061 Spinal stenosis, lumbar region without neurogenic claudication: Secondary | ICD-10-CM | POA: Diagnosis not present

## 2016-04-08 DIAGNOSIS — M545 Low back pain: Secondary | ICD-10-CM | POA: Diagnosis not present

## 2016-04-08 DIAGNOSIS — S22060A Wedge compression fracture of T7-T8 vertebra, initial encounter for closed fracture: Secondary | ICD-10-CM | POA: Diagnosis not present

## 2016-04-09 DIAGNOSIS — Z6825 Body mass index (BMI) 25.0-25.9, adult: Secondary | ICD-10-CM | POA: Diagnosis not present

## 2016-04-09 DIAGNOSIS — I10 Essential (primary) hypertension: Secondary | ICD-10-CM | POA: Diagnosis not present

## 2016-04-10 ENCOUNTER — Telehealth: Payer: Self-pay | Admitting: Internal Medicine

## 2016-04-10 NOTE — Telephone Encounter (Signed)
No idea when her procedure is yet and has no idea what it is called. Patient will call back after she is told the date and name of this procedure

## 2016-04-10 NOTE — Telephone Encounter (Signed)
Patient is having back surgery and is unsure of the date of surgery. Patient would like to know when she should stop her blood thinner medication prior to having the surgery. Please advise.

## 2016-04-12 ENCOUNTER — Other Ambulatory Visit: Payer: Self-pay | Admitting: Neurosurgery

## 2016-04-13 NOTE — Telephone Encounter (Signed)
Patient left vmail. Surgery will be on 04/18/2016. Requesting to know when to stop blood thinner.

## 2016-04-16 ENCOUNTER — Encounter (HOSPITAL_COMMUNITY): Payer: Self-pay

## 2016-04-16 ENCOUNTER — Encounter (HOSPITAL_COMMUNITY)
Admission: RE | Admit: 2016-04-16 | Discharge: 2016-04-16 | Disposition: A | Discharge status: Home / Self Care | Payer: PPO | Source: Ambulatory Visit | Attending: Neurosurgery | Admitting: Neurosurgery

## 2016-04-16 DIAGNOSIS — I69354 Hemiplegia and hemiparesis following cerebral infarction affecting left non-dominant side: Secondary | ICD-10-CM | POA: Diagnosis not present

## 2016-04-16 DIAGNOSIS — M4854XA Collapsed vertebra, not elsewhere classified, thoracic region, initial encounter for fracture: Secondary | ICD-10-CM | POA: Diagnosis not present

## 2016-04-16 DIAGNOSIS — Z886 Allergy status to analgesic agent status: Secondary | ICD-10-CM | POA: Diagnosis not present

## 2016-04-16 DIAGNOSIS — I10 Essential (primary) hypertension: Secondary | ICD-10-CM | POA: Diagnosis not present

## 2016-04-16 DIAGNOSIS — M810 Age-related osteoporosis without current pathological fracture: Secondary | ICD-10-CM | POA: Diagnosis not present

## 2016-04-16 DIAGNOSIS — F329 Major depressive disorder, single episode, unspecified: Secondary | ICD-10-CM | POA: Diagnosis not present

## 2016-04-16 DIAGNOSIS — K219 Gastro-esophageal reflux disease without esophagitis: Secondary | ICD-10-CM | POA: Diagnosis not present

## 2016-04-16 DIAGNOSIS — D649 Anemia, unspecified: Secondary | ICD-10-CM | POA: Diagnosis not present

## 2016-04-16 DIAGNOSIS — F419 Anxiety disorder, unspecified: Secondary | ICD-10-CM | POA: Diagnosis not present

## 2016-04-16 DIAGNOSIS — M1990 Unspecified osteoarthritis, unspecified site: Secondary | ICD-10-CM | POA: Diagnosis not present

## 2016-04-16 DIAGNOSIS — K449 Diaphragmatic hernia without obstruction or gangrene: Secondary | ICD-10-CM | POA: Diagnosis not present

## 2016-04-16 LAB — BASIC METABOLIC PANEL
ANION GAP: 10 (ref 5–15)
BUN: 9 mg/dL (ref 6–20)
CHLORIDE: 106 mmol/L (ref 101–111)
CO2: 22 mmol/L (ref 22–32)
Calcium: 9.3 mg/dL (ref 8.9–10.3)
Creatinine, Ser: 0.44 mg/dL (ref 0.44–1.00)
GFR calc non Af Amer: >60 mL/min (ref >60)
GLUCOSE: 96 mg/dL (ref 65–99)
Potassium: 4.6 mmol/L (ref 3.5–5.1)
Sodium: 138 mmol/L (ref 135–145)

## 2016-04-16 LAB — CBC
HCT: 41.5 % (ref 36.0–46.0)
HEMOGLOBIN: 13.7 g/dL (ref 12.0–15.0)
MCH: 34.7 pg — AB (ref 26.0–34.0)
MCHC: 33 g/dL (ref 30.0–36.0)
MCV: 105.1 fL — AB (ref 78.0–100.0)
Platelets: 276 10*3/uL (ref 150–400)
RBC: 3.95 MIL/uL (ref 3.87–5.11)
RDW: 13.1 % (ref 11.5–15.5)
WBC: 7.7 10*3/uL (ref 4.0–10.5)

## 2016-04-16 LAB — SURGICAL PCR SCREEN
MRSA, PCR: NEGATIVE
STAPHYLOCOCCUS AUREUS: NEGATIVE

## 2016-04-16 NOTE — Pre-Procedure Instructions (Addendum)
    Valerie Hanna  04/16/2016      [facility] [address] [facility] [address] [place] [address] Phone: [phone] Fax: [phone]    Your procedure is scheduled on Wednesday, November 15th   Report to [facility] Admitting at 11:45 Am             (posted surgery time is 1:45 pm - 3:04 pm).   Call this number if you have problems the MORNING of surgery:  [phone]   Remember:  Do not eat food or drink liquids after midnight Tuesday.  Take these medicines the morning of surgery with A SIP OF WATER : Xanax, Protonix, Tramadol.  Please use your nose spray the morning of surgery.              4-5 days prior to surgery, STOP TAKING any herbal supplements, vitamins, blood thinners, anti-inflammatories   Do not wear jewelry, make-up or nail polish.  Do not wear lotions, powders, perfumes, or deoderant.   Do not shave underarms & legs 48 hours prior to surgery.   Do not bring valuables to the hospital.  [facility] is not responsible for any belongings or valuables.  Contacts, dentures or bridgework may not be worn into surgery.  Leave your suitcase in the car.  After surgery it may be brought to your room. For patients admitted to the hospital, discharge time will be determined by your treatment team.  Please read over the following fact sheets that you were given. Pain Booklet, MRSA Information and Surgical Site Infection Prevention

## 2016-04-16 NOTE — Progress Notes (Signed)
Denies any cardiac issues or problems.  Has never been to see a cardio. PCP is Dr. Army Melia @ [place] 03/2016 Patient had stroke around 3-4 yrs ago, and has been on Plavix.  She had been instructed to stop Plavix, which her last dose was 03/11/2016

## 2016-04-17 MED ORDER — CEFAZOLIN SODIUM-DEXTROSE 2-4 GM/100ML-% IV SOLN
2.0000 g | INTRAVENOUS | Status: AC
  Administered 2016-04-18: 2 g via INTRAVENOUS
  Filled 2016-04-17: qty 100

## 2016-04-18 ENCOUNTER — Observation Stay (HOSPITAL_COMMUNITY)
Admission: RE | Admit: 2016-04-18 | Discharge: 2016-04-21 | Disposition: A | Discharge status: Home / Self Care | Payer: PPO | Source: Ambulatory Visit | Attending: Neurosurgery | Admitting: Neurosurgery

## 2016-04-18 ENCOUNTER — Ambulatory Visit (HOSPITAL_COMMUNITY): Payer: PPO | Admitting: Certified Registered Nurse Anesthetist

## 2016-04-18 ENCOUNTER — Ambulatory Visit (HOSPITAL_COMMUNITY): Payer: PPO

## 2016-04-18 ENCOUNTER — Encounter (HOSPITAL_COMMUNITY): Payer: Self-pay | Admitting: Anesthesiology

## 2016-04-18 ENCOUNTER — Ambulatory Visit: Payer: PPO

## 2016-04-18 ENCOUNTER — Encounter (HOSPITAL_COMMUNITY)
Admission: RE | Disposition: A | Discharge status: Home / Self Care | Payer: Self-pay | Source: Ambulatory Visit | Attending: Neurosurgery

## 2016-04-18 DIAGNOSIS — M4854XA Collapsed vertebra, not elsewhere classified, thoracic region, initial encounter for fracture: Secondary | ICD-10-CM | POA: Diagnosis not present

## 2016-04-18 DIAGNOSIS — F419 Anxiety disorder, unspecified: Secondary | ICD-10-CM | POA: Insufficient documentation

## 2016-04-18 DIAGNOSIS — I69354 Hemiplegia and hemiparesis following cerebral infarction affecting left non-dominant side: Secondary | ICD-10-CM | POA: Diagnosis not present

## 2016-04-18 DIAGNOSIS — F329 Major depressive disorder, single episode, unspecified: Secondary | ICD-10-CM | POA: Insufficient documentation

## 2016-04-18 DIAGNOSIS — D649 Anemia, unspecified: Secondary | ICD-10-CM | POA: Insufficient documentation

## 2016-04-18 DIAGNOSIS — K219 Gastro-esophageal reflux disease without esophagitis: Secondary | ICD-10-CM | POA: Diagnosis not present

## 2016-04-18 DIAGNOSIS — S22060A Wedge compression fracture of T7-T8 vertebra, initial encounter for closed fracture: Secondary | ICD-10-CM | POA: Diagnosis not present

## 2016-04-18 DIAGNOSIS — R937 Abnormal findings on diagnostic imaging of other parts of musculoskeletal system: Secondary | ICD-10-CM | POA: Diagnosis not present

## 2016-04-18 DIAGNOSIS — M1990 Unspecified osteoarthritis, unspecified site: Secondary | ICD-10-CM | POA: Diagnosis not present

## 2016-04-18 DIAGNOSIS — I10 Essential (primary) hypertension: Secondary | ICD-10-CM | POA: Insufficient documentation

## 2016-04-18 DIAGNOSIS — K449 Diaphragmatic hernia without obstruction or gangrene: Secondary | ICD-10-CM | POA: Diagnosis not present

## 2016-04-18 DIAGNOSIS — M810 Age-related osteoporosis without current pathological fracture: Secondary | ICD-10-CM | POA: Insufficient documentation

## 2016-04-18 DIAGNOSIS — J309 Allergic rhinitis, unspecified: Secondary | ICD-10-CM | POA: Diagnosis not present

## 2016-04-18 DIAGNOSIS — Z886 Allergy status to analgesic agent status: Secondary | ICD-10-CM | POA: Diagnosis not present

## 2016-04-18 DIAGNOSIS — Z419 Encounter for procedure for purposes other than remedying health state, unspecified: Secondary | ICD-10-CM

## 2016-04-18 DIAGNOSIS — S22070A Wedge compression fracture of T9-T10 vertebra, initial encounter for closed fracture: Secondary | ICD-10-CM | POA: Diagnosis not present

## 2016-04-18 DIAGNOSIS — S22000A Wedge compression fracture of unspecified thoracic vertebra, initial encounter for closed fracture: Secondary | ICD-10-CM | POA: Diagnosis present

## 2016-04-18 DIAGNOSIS — E785 Hyperlipidemia, unspecified: Secondary | ICD-10-CM | POA: Diagnosis not present

## 2016-04-18 HISTORY — PX: KYPHOPLASTY: SHX5884

## 2016-04-18 SURGERY — KYPHOPLASTY
Anesthesia: General

## 2016-04-18 MED ORDER — TRAZODONE HCL 50 MG PO TABS: 50.0000 mg | ORAL_TABLET | Freq: Every evening | ORAL | Status: DC | PRN

## 2016-04-18 MED ORDER — HYDROMORPHONE HCL 1 MG/ML IJ SOLN
INTRAMUSCULAR | Status: AC
  Filled 2016-04-18: qty 1

## 2016-04-18 MED ORDER — CHLORHEXIDINE GLUCONATE CLOTH 2 % EX PADS: 6.0000 | MEDICATED_PAD | Freq: Once | CUTANEOUS | Status: DC

## 2016-04-18 MED ORDER — ROCURONIUM BROMIDE 10 MG/ML (PF) SYRINGE
PREFILLED_SYRINGE | INTRAVENOUS | Status: AC
  Filled 2016-04-18: qty 10

## 2016-04-18 MED ORDER — ONDANSETRON HCL 4 MG/2ML IJ SOLN
INTRAMUSCULAR | Status: AC
  Filled 2016-04-18: qty 2

## 2016-04-18 MED ORDER — LIDOCAINE 2% (20 MG/ML) 5 ML SYRINGE
INTRAMUSCULAR | Status: DC | PRN
  Administered 2016-04-18: 60 mg via INTRAVENOUS

## 2016-04-18 MED ORDER — LORATADINE 10 MG PO TABS
10.0000 mg | ORAL_TABLET | Freq: Every day | ORAL | Status: DC
  Administered 2016-04-18 – 2016-04-21 (×4): 10 mg via ORAL
  Filled 2016-04-18 (×4): qty 1

## 2016-04-18 MED ORDER — CALCIUM 500 MG PO TABS: 1.0000 | ORAL_TABLET | Freq: Every day | ORAL | Status: DC

## 2016-04-18 MED ORDER — FENTANYL CITRATE (PF) 100 MCG/2ML IJ SOLN
INTRAMUSCULAR | Status: AC
  Filled 2016-04-18: qty 4

## 2016-04-18 MED ORDER — HYDROMORPHONE HCL 1 MG/ML IJ SOLN
0.5000 mg | Freq: Once | INTRAMUSCULAR | Status: AC
  Administered 2016-04-18 (×2): 0.5 mg via INTRAVENOUS

## 2016-04-18 MED ORDER — OXYCODONE HCL 5 MG PO TABS
5.0000 mg | ORAL_TABLET | Freq: Four times a day (QID) | ORAL | Status: DC | PRN
  Administered 2016-04-18 – 2016-04-21 (×11): 5 mg via ORAL
  Filled 2016-04-18 (×11): qty 1

## 2016-04-18 MED ORDER — MIRTAZAPINE 15 MG PO TABS
30.0000 mg | ORAL_TABLET | Freq: Every day | ORAL | Status: DC
  Administered 2016-04-18 – 2016-04-20 (×3): 30 mg via ORAL
  Filled 2016-04-18 (×2): qty 1
  Filled 2016-04-18: qty 2

## 2016-04-18 MED ORDER — IOPAMIDOL (ISOVUE-300) INJECTION 61%
INTRAVENOUS | Status: DC | PRN
  Administered 2016-04-18 (×2): 50 mL via INTRAVENOUS

## 2016-04-18 MED ORDER — ONDANSETRON HCL 4 MG/2ML IJ SOLN
INTRAMUSCULAR | Status: DC | PRN
  Administered 2016-04-18: 4 mg via INTRAVENOUS

## 2016-04-18 MED ORDER — DEXAMETHASONE SODIUM PHOSPHATE 10 MG/ML IJ SOLN
INTRAMUSCULAR | Status: AC
  Filled 2016-04-18: qty 1

## 2016-04-18 MED ORDER — ADULT MULTIVITAMIN W/MINERALS CH
1.0000 | ORAL_TABLET | Freq: Every day | ORAL | Status: DC
  Administered 2016-04-18 – 2016-04-21 (×4): 1 via ORAL
  Filled 2016-04-18 (×4): qty 1

## 2016-04-18 MED ORDER — LABETALOL HCL 5 MG/ML IV SOLN
INTRAVENOUS | Status: AC
  Filled 2016-04-18: qty 4

## 2016-04-18 MED ORDER — ROCURONIUM BROMIDE 100 MG/10ML IV SOLN
INTRAVENOUS | Status: DC | PRN
  Administered 2016-04-18: 40 mg via INTRAVENOUS
  Administered 2016-04-18: 10 mg via INTRAVENOUS

## 2016-04-18 MED ORDER — HYDROMORPHONE HCL 2 MG/ML IJ SOLN
INTRAMUSCULAR | Status: AC
  Filled 2016-04-18: qty 1

## 2016-04-18 MED ORDER — IOPAMIDOL (ISOVUE-300) INJECTION 61%
INTRAVENOUS | Status: AC
  Filled 2016-04-18: qty 50

## 2016-04-18 MED ORDER — LACTATED RINGERS IV SOLN
INTRAVENOUS | Status: DC
  Administered 2016-04-18 (×3): via INTRAVENOUS

## 2016-04-18 MED ORDER — SUCCINYLCHOLINE CHLORIDE 200 MG/10ML IV SOSY
PREFILLED_SYRINGE | INTRAVENOUS | Status: AC
  Filled 2016-04-18: qty 10

## 2016-04-18 MED ORDER — IPRATROPIUM BROMIDE 0.06 % NA SOLN
2.0000 | Freq: Three times a day (TID) | NASAL | Status: DC
  Administered 2016-04-19 – 2016-04-21 (×5): 2 via NASAL
  Filled 2016-04-18: qty 15

## 2016-04-18 MED ORDER — LIDOCAINE-EPINEPHRINE 1 %-1:100000 IJ SOLN
INTRAMUSCULAR | Status: DC | PRN
Start: 2016-04-18 — End: 2016-04-18
  Administered 2016-04-18: 12 mL

## 2016-04-18 MED ORDER — DEXAMETHASONE SODIUM PHOSPHATE 10 MG/ML IJ SOLN
INTRAMUSCULAR | Status: DC | PRN
  Administered 2016-04-18: 10 mg via INTRAVENOUS

## 2016-04-18 MED ORDER — 0.9 % SODIUM CHLORIDE (POUR BTL) OPTIME
TOPICAL | Status: DC | PRN
  Administered 2016-04-18: 1000 mL

## 2016-04-18 MED ORDER — CALCIUM CARBONATE-VITAMIN D 500-200 MG-UNIT PO TABS
1.0000 | ORAL_TABLET | Freq: Every day | ORAL | Status: DC
  Administered 2016-04-19 – 2016-04-21 (×3): 1 via ORAL
  Filled 2016-04-18 (×3): qty 1

## 2016-04-18 MED ORDER — LIDOCAINE 2% (20 MG/ML) 5 ML SYRINGE
INTRAMUSCULAR | Status: AC
  Filled 2016-04-18: qty 5

## 2016-04-18 MED ORDER — SUGAMMADEX SODIUM 200 MG/2ML IV SOLN
INTRAVENOUS | Status: DC | PRN
  Administered 2016-04-18: 120 mg via INTRAVENOUS

## 2016-04-18 MED ORDER — MEPERIDINE HCL 25 MG/ML IJ SOLN: 6.2500 mg | INTRAMUSCULAR | Status: DC | PRN

## 2016-04-18 MED ORDER — FOLIC ACID 1 MG PO TABS: 1.0000 mg | ORAL_TABLET | Freq: Every day | ORAL | Status: DC

## 2016-04-18 MED ORDER — SENNOSIDES-DOCUSATE SODIUM 8.6-50 MG PO TABS
1.0000 | ORAL_TABLET | Freq: Two times a day (BID) | ORAL | Status: DC
  Administered 2016-04-18 – 2016-04-21 (×6): 1 via ORAL
  Filled 2016-04-18 (×6): qty 1

## 2016-04-18 MED ORDER — VITAMIN B-12 100 MCG PO TABS
250.0000 ug | ORAL_TABLET | Freq: Every day | ORAL | Status: DC
  Administered 2016-04-19 – 2016-04-21 (×3): 250 ug via ORAL
  Filled 2016-04-18: qty 1
  Filled 2016-04-18: qty 3
  Filled 2016-04-18: qty 1

## 2016-04-18 MED ORDER — PROPOFOL 10 MG/ML IV BOLUS
INTRAVENOUS | Status: DC | PRN
  Administered 2016-04-18: 120 mg via INTRAVENOUS

## 2016-04-18 MED ORDER — PHENYLEPHRINE HCL 10 MG/ML IJ SOLN
INTRAVENOUS | Status: DC | PRN
  Administered 2016-04-18: 50 ug/min via INTRAVENOUS

## 2016-04-18 MED ORDER — LIDOCAINE-EPINEPHRINE 1 %-1:100000 IJ SOLN
INTRAMUSCULAR | Status: AC
  Filled 2016-04-18: qty 1

## 2016-04-18 MED ORDER — ARTIFICIAL TEARS OP OINT
TOPICAL_OINTMENT | OPHTHALMIC | Status: AC
  Filled 2016-04-18: qty 3.5

## 2016-04-18 MED ORDER — SIMVASTATIN 40 MG PO TABS
40.0000 mg | ORAL_TABLET | Freq: Every day | ORAL | Status: DC
  Administered 2016-04-18 – 2016-04-21 (×4): 40 mg via ORAL
  Filled 2016-04-18 (×2): qty 1
  Filled 2016-04-18 (×2): qty 2

## 2016-04-18 MED ORDER — SUGAMMADEX SODIUM 200 MG/2ML IV SOLN
INTRAVENOUS | Status: AC
  Filled 2016-04-18: qty 2

## 2016-04-18 MED ORDER — SERTRALINE HCL 100 MG PO TABS
100.0000 mg | ORAL_TABLET | Freq: Every day | ORAL | Status: DC
  Administered 2016-04-18 – 2016-04-20 (×3): 100 mg via ORAL
  Filled 2016-04-18: qty 2
  Filled 2016-04-18: qty 1
  Filled 2016-04-18: qty 2

## 2016-04-18 MED ORDER — TRAMADOL HCL 50 MG PO TABS
50.0000 mg | ORAL_TABLET | Freq: Three times a day (TID) | ORAL | Status: DC | PRN
  Administered 2016-04-19 – 2016-04-21 (×3): 50 mg via ORAL
  Filled 2016-04-18 (×3): qty 1

## 2016-04-18 MED ORDER — FENTANYL CITRATE (PF) 100 MCG/2ML IJ SOLN
INTRAMUSCULAR | Status: DC | PRN
  Administered 2016-04-18 (×2): 100 ug via INTRAVENOUS

## 2016-04-18 MED ORDER — POTASSIUM CHLORIDE IN NACL 20-0.9 MEQ/L-% IV SOLN: INTRAVENOUS | Status: DC

## 2016-04-18 MED ORDER — FENTANYL CITRATE (PF) 100 MCG/2ML IJ SOLN
INTRAMUSCULAR | Status: AC
  Filled 2016-04-18: qty 2

## 2016-04-18 MED ORDER — ALPRAZOLAM 0.25 MG PO TABS
0.2500 mg | ORAL_TABLET | Freq: Every evening | ORAL | Status: DC | PRN
  Administered 2016-04-18 – 2016-04-20 (×3): 0.25 mg via ORAL
  Filled 2016-04-18 (×3): qty 1

## 2016-04-18 MED ORDER — PANTOPRAZOLE SODIUM 40 MG PO TBEC
40.0000 mg | DELAYED_RELEASE_TABLET | Freq: Two times a day (BID) | ORAL | Status: DC
  Administered 2016-04-18 – 2016-04-21 (×6): 40 mg via ORAL
  Filled 2016-04-18 (×6): qty 1

## 2016-04-18 MED ORDER — HYDROMORPHONE HCL 1 MG/ML IJ SOLN
0.2500 mg | INTRAMUSCULAR | Status: DC | PRN
  Administered 2016-04-18 (×4): 0.5 mg via INTRAVENOUS

## 2016-04-18 MED ORDER — METOCLOPRAMIDE HCL 5 MG/ML IJ SOLN: 10.0000 mg | Freq: Once | INTRAMUSCULAR | Status: DC | PRN

## 2016-04-18 MED ORDER — HYDROMORPHONE HCL 1 MG/ML IJ SOLN
INTRAMUSCULAR | Status: DC | PRN
  Administered 2016-04-18 (×2): .25 mg via INTRAVENOUS
  Administered 2016-04-18: .5 mg via INTRAVENOUS

## 2016-04-18 MED ORDER — HYDROMORPHONE HCL 1 MG/ML IJ SOLN
INTRAMUSCULAR | Status: AC
  Administered 2016-04-18: 0.5 mg via INTRAVENOUS
  Filled 2016-04-18: qty 1

## 2016-04-18 MED ORDER — ERGOCALCIFEROL 50 MCG (2000 UT) PO TABS: 1.0000 | ORAL_TABLET | Freq: Every day | ORAL | Status: DC

## 2016-04-18 SURGICAL SUPPLY — 40 items
BLADE CLIPPER SURG (BLADE) IMPLANT
BLADE SURG 15 STRL LF DISP TIS (BLADE) ×1 IMPLANT
BLADE SURG 15 STRL SS (BLADE) ×1
CARTRIDGE OIL MAESTRO DRILL (MISCELLANEOUS) ×1 IMPLANT
CEMENT KYPHON C01A KIT/MIXER (Cement) ×2 IMPLANT
DERMABOND ADVANCED (GAUZE/BANDAGES/DRESSINGS) ×1
DERMABOND ADVANCED .7 DNX12 (GAUZE/BANDAGES/DRESSINGS) ×1 IMPLANT
DIFFUSER DRILL AIR PNEUMATIC (MISCELLANEOUS) ×2 IMPLANT
DRAPE C-ARM 42X72 X-RAY (DRAPES) ×4 IMPLANT
DRAPE HALF SHEET 40X57 (DRAPES) ×2 IMPLANT
DRAPE LAPAROTOMY 100X72X124 (DRAPES) ×2 IMPLANT
DRAPE SURG 17X23 STRL (DRAPES) ×2 IMPLANT
DRAPE WARM FLUID 44X44 (DRAPE) ×2 IMPLANT
DURAPREP 26ML APPLICATOR (WOUND CARE) ×2 IMPLANT
GAUZE SPONGE 4X4 16PLY XRAY LF (GAUZE/BANDAGES/DRESSINGS) ×2 IMPLANT
GLOVE BIO SURGEON STRL SZ7 (GLOVE) ×4 IMPLANT
GLOVE ECLIPSE 6.5 STRL STRAW (GLOVE) ×2 IMPLANT
GLOVE EXAM NITRILE LRG STRL (GLOVE) IMPLANT
GLOVE EXAM NITRILE XL STR (GLOVE) IMPLANT
GLOVE EXAM NITRILE XS STR PU (GLOVE) IMPLANT
GLOVE INDICATOR 7.5 STRL GRN (GLOVE) ×2 IMPLANT
GOWN STRL REUS W/ TWL LRG LVL3 (GOWN DISPOSABLE) ×2 IMPLANT
GOWN STRL REUS W/ TWL XL LVL3 (GOWN DISPOSABLE) IMPLANT
GOWN STRL REUS W/TWL 2XL LVL3 (GOWN DISPOSABLE) IMPLANT
GOWN STRL REUS W/TWL LRG LVL3 (GOWN DISPOSABLE) ×2
GOWN STRL REUS W/TWL XL LVL3 (GOWN DISPOSABLE)
KIT BASIN OR (CUSTOM PROCEDURE TRAY) ×2 IMPLANT
KIT ROOM TURNOVER OR (KITS) ×2 IMPLANT
NEEDLE HYPO 25X1 1.5 SAFETY (NEEDLE) ×2 IMPLANT
NS IRRIG 1000ML POUR BTL (IV SOLUTION) ×2 IMPLANT
OIL CARTRIDGE MAESTRO DRILL (MISCELLANEOUS) ×2
PACK SURGICAL SETUP 50X90 (CUSTOM PROCEDURE TRAY) ×2 IMPLANT
PAD ARMBOARD 7.5X6 YLW CONV (MISCELLANEOUS) ×6 IMPLANT
SPECIMEN JAR SMALL (MISCELLANEOUS) IMPLANT
STAPLER SKIN PROX WIDE 3.9 (STAPLE) ×2 IMPLANT
SUT VIC AB 3-0 SH 8-18 (SUTURE) ×2 IMPLANT
SYR CONTROL 10ML LL (SYRINGE) ×4 IMPLANT
TOWEL OR 17X24 6PK STRL BLUE (TOWEL DISPOSABLE) ×2 IMPLANT
TOWEL OR 17X26 10 PK STRL BLUE (TOWEL DISPOSABLE) ×2 IMPLANT
TRAY KYPHOPAK 15/3 ONESTEP 1ST (MISCELLANEOUS) ×4 IMPLANT

## 2016-04-18 NOTE — Op Note (Signed)
04/18/2016  4:15 PM  PATIENT:  Valerie Hanna  75 y.o. female with two subacute thoracic compression fractures at T8, and T10. She is admitted for kyphoplasties at each level.   PRE-OPERATIVE DIAGNOSIS:  CLOSED COMPRESSION FRACTURE OF THORACIC VERTEBA T8, and T10  POST-OPERATIVE DIAGNOSIS:  CLOSED COMPRESSION FRACTURE OF THORACIC VERTEBA T8, and T10  PROCEDURE:  Procedure(s): KYPHOPLASTY Thoracic eight-Thoracic ten  SURGEON:  Surgeon(s): Ashok Pall, MD  ANESTHESIA:   general  EBL:  Total I/O In: 1000 [I.V.:1000] Out: 100 [Blood:100]  BLOOD ADMINISTERED:none  COUNT:per nursing  SPECIMEN:  No Specimen  DICTATION: [name] was taken to the operating room, intubated and placed under a general anesthetic without difficulty. She was positioned prone on the operating room table with all pressure points properly padded. Her back was prepped and draped in a sterile manner. With fluoroscopy I localized the T8, and T10 pedicles bilaterally. I injected lidocaine into the entry sites on both the left and right sides. I started by making a stab incision on the right  side and entering the T10 pedicle with fluoroscopic guidance. Once good position was obtained, I drilled into the vertebral body. I then placed the kyphoplasty balloon into the T10 vertebra and inflated the balloon. I then inserted 3cc of methylmethacrylate into the vertebral body under fluoroscopic guidance. I achieved a good fill of the cavity, staying within the confines of the vertebral body. I removed the instrumentation from the vertebral body, and the final films looked good. I closed the stab incision with vicryl suture and used Dermabond for a sterile dressing. I performed the same procedure at the T8 level . However at T8 I used both the right and left sides to achieve an adequate fill of the T8 vertebral body. Total injected was ~2cc    PLAN OF CARE: Admit to inpatient   PATIENT DISPOSITION:  PACU - hemodynamically  stable.   Delay start of Pharmacological VTE agent (>24hrs) due to surgical blood loss or risk of bleeding:  yes

## 2016-04-18 NOTE — Anesthesia Preprocedure Evaluation (Addendum)
Anesthesia Evaluation  Patient identified by MRN, date of birth, ID band Patient awake    Reviewed: Allergy & Precautions, NPO status , Patient's Chart, lab work & pertinent test results  Airway Mallampati: II  TM Distance: >3 FB Neck ROM: Full    Dental  (+) Poor Dentition, Missing   Pulmonary Current Smoker,    Pulmonary exam normal breath sounds clear to auscultation       Cardiovascular hypertension, Pt. on medications Normal cardiovascular exam Rhythm:Regular Rate:Normal     Neuro/Psych  Headaches, PSYCHIATRIC DISORDERS Anxiety Depression Left lower extremity weakness CVA, Residual Symptoms    GI/Hepatic Neg liver ROS, hiatal hernia, GERD  Medicated and Controlled,Hx/o Barrett's exophagus   Endo/Other  Hypercholesterolemia  Renal/GU negative Renal ROS  negative genitourinary   Musculoskeletal  (+) Arthritis , Osteoarthritis,  Osteoporosis Compression Fx Thoracic spine T8-T10   Abdominal   Peds  Hematology  (+) anemia ,   Anesthesia Other Findings   Reproductive/Obstetrics                            Lab Results  Component Value Date   WBC 7.7 04/16/2016   HGB 13.7 04/16/2016   HCT 41.5 04/16/2016   MCV 105.1 (H) 04/16/2016   PLT 276 04/16/2016     Chemistry      Component Value Date/Time   NA 138 04/16/2016 1535   NA 138 07/20/2015 1105   NA 144 02/23/2013 0545   K 4.6 04/16/2016 1535   K 4.1 02/23/2013 0545   CL 106 04/16/2016 1535   CL 112 (H) 02/23/2013 0545   CO2 22 04/16/2016 1535   CO2 25 02/23/2013 0545   BUN 9 04/16/2016 1535   BUN 12 07/20/2015 1105   BUN 9 02/23/2013 0545   CREATININE 0.44 04/16/2016 1535   CREATININE 0.45 (L) 02/23/2013 0545      Component Value Date/Time   CALCIUM 9.3 04/16/2016 1535   CALCIUM 8.9 02/23/2013 0545   ALKPHOS 84 02/19/2016 0023   ALKPHOS 141 (H) 02/01/2013 0454   AST 19 02/19/2016 0023   AST 38 (H) 02/01/2013 0454   ALT  17 02/19/2016 0023   ALT 21 02/01/2013 0454   BILITOT 0.3 02/19/2016 0023   BILITOT 0.3 07/20/2015 1105   BILITOT 0.5 02/01/2013 0454     EKG: sinus tachycardia.   Chemistry      Component Value Date/Time   NA 138 04/16/2016 1535   NA 138 07/20/2015 1105   NA 144 02/23/2013 0545   K 4.6 04/16/2016 1535   K 4.1 02/23/2013 0545   CL 106 04/16/2016 1535   CL 112 (H) 02/23/2013 0545   CO2 22 04/16/2016 1535   CO2 25 02/23/2013 0545   BUN 9 04/16/2016 1535   BUN 12 07/20/2015 1105   BUN 9 02/23/2013 0545   CREATININE 0.44 04/16/2016 1535   CREATININE 0.45 (L) 02/23/2013 0545      Component Value Date/Time   CALCIUM 9.3 04/16/2016 1535   CALCIUM 8.9 02/23/2013 0545   ALKPHOS 84 02/19/2016 0023   ALKPHOS 141 (H) 02/01/2013 0454   AST 19 02/19/2016 0023   AST 38 (H) 02/01/2013 0454   ALT 17 02/19/2016 0023   ALT 21 02/01/2013 0454   BILITOT 0.3 02/19/2016 0023   BILITOT 0.3 07/20/2015 1105   BILITOT 0.5 02/01/2013 0454     Lab Results  Component Value Date   WBC 7.7 04/16/2016   HGB  13.7 04/16/2016   HCT 41.5 04/16/2016   MCV 105.1 (H) 04/16/2016   PLT 276 04/16/2016   EKG: normal EKG, normal sinus rhythm.  Anesthesia Physical Anesthesia Plan  ASA: III  Anesthesia Plan: General   Post-op Pain Management:    Induction: Intravenous  Airway Management Planned: Oral ETT  Additional Equipment:   Intra-op Plan:   Post-operative Plan: Extubation in OR  Informed Consent: I have reviewed the patients History and Physical, chart, labs and discussed the procedure including the risks, benefits and alternatives for the proposed anesthesia with the patient or authorized representative who has indicated his/her understanding and acceptance.   Dental advisory given  Plan Discussed with: CRNA, Anesthesiologist and Surgeon  Anesthesia Plan Comments:         Anesthesia Quick Evaluation

## 2016-04-18 NOTE — Anesthesia Procedure Notes (Addendum)
Procedure Name: Intubation Date/Time: 04/18/2016 2:20 PM Performed by: Trixie Deis A Pre-anesthesia Checklist: Patient identified, Emergency Drugs available, Suction available and Patient being monitored Patient Re-evaluated:Patient Re-evaluated prior to inductionOxygen Delivery Method: Circle System Utilized Preoxygenation: Pre-oxygenation with 100% oxygen Intubation Type: IV induction Ventilation: Mask ventilation without difficulty Laryngoscope Size: Mac and 3 Grade View: Grade I Tube type: Oral Tube size: 7.0 mm Number of attempts: 1 Airway Equipment and Method: Stylet and Oral airway Placement Confirmation: ETT inserted through vocal cords under direct vision,  positive ETCO2 and breath sounds checked- equal and bilateral Secured at: 21 cm Tube secured with: Tape Dental Injury: Teeth and Oropharynx as per pre-operative assessment

## 2016-04-18 NOTE — Anesthesia Postprocedure Evaluation (Signed)
Anesthesia Post Note  Patient: Valerie Hanna  Procedure(s) Performed: Procedure(s) (LRB): KYPHOPLASTY Thoracic eight-Thoracic ten (N/A)  Patient location during evaluation: PACU Anesthesia Type: General Level of consciousness: awake and alert and oriented Pain management: pain level controlled Vital Signs Assessment: post-procedure vital signs reviewed and stable Respiratory status: spontaneous breathing, nonlabored ventilation, respiratory function stable and patient connected to nasal cannula oxygen Cardiovascular status: blood pressure returned to baseline and stable Postop Assessment: no signs of nausea or vomiting Anesthetic complications: no    Last Vitals:  Vitals:   04/18/16 1620 04/18/16 1635  BP: (!) 161/75 (!) 139/95  Pulse: (!) 105 (!) 103  Resp: 17 18  Temp:      Last Pain:  Vitals:   04/18/16 1640  TempSrc:   PainSc: 4                  Solita Macadam A.

## 2016-04-18 NOTE — H&P (Signed)
Mrs. Amberlynn Stradling was sent to the office today for evaluation of pain that she has in her back. There was some question, at least on her referral, of a compression fracture but this had already been answered by the radiologist, who stated, and as I saw for myself, T9 is not an acute fracture and it was present on CTs done in 05/2015 before she had her hip operation. So, essentially she is just in pain. She does not have a study that can truly assess the thoracic spine because she is so osteopenic.  PHYSICAL EXAMINATION: Muscle tone is normal. Muscle bulk is normal. Pupils equal, round, and reactive to light. Full extraocular movements. Normal memory, language, and attention span.  IMPRESSION/PLAN: I will order an MRI of the thoracic and lumbar regions to see if there is something else to do. She has almost every vertebral body with vertebroplasty material in the lumbar spine, so there is nothing else there to do and she certainly has no new fractures in that area. She has kyphoplasty from T11 through L4. I do not have any reason to believe that she has a new spine problem, but she does have pain. I did give her Oxycodone today, 28 tablets, because she seemed to be in pain which is greater than the tramadol that was given. I will not be giving this to her again. She has significant pain when she is ambulating. She moans and groans essentially with every step. She has some shortness of breath secondary to the pain. She has to use a walker, but she has been using the walker now for quite some time. I will get these studies and see if there is something which I can determine is going on, but it seems fairly vague. She has back pain. Unfortunately, she has had back pain for quite some time and it is not at all clear at this time that there is some neurosurgical input that is required but we will see what the MRI shows.  Mertha Baars returned today with an MRI of the cervical spine. What it shows is that she has  acute compression fractures subacute at T8 and T10. This is the most likely the reason that she is having increased pain.  IMPRESSION/PLAN: We will go ahead and schedule a kyphoplasty for her. She is well versed in this procedure, and has quite a few of them. She says that she is on a blood thinner, I could not find the medication on the list. She is going to contact her physician Dr. Army Melia, and we will have to verify that, so I will schedule her for two weeks for a T8-T10 kyphoplasty, but we still will wait while we find out about this blood thinner. I will see her for her surgery. Risks and benefits of bleeding, infection, paralysis, damage to the spinal cord, loss of bowel or bladder control, loss of bowel or bladder function, and other risks were discussed. She understands again having had this procedure multiple times over five.

## 2016-04-18 NOTE — Transfer of Care (Signed)
Immediate Anesthesia Transfer of Care Note  Patient: Valerie Hanna  Procedure(s) Performed: Procedure(s) with comments: KYPHOPLASTY Thoracic eight-Thoracic ten (N/A) - KYPHOPLASTY T8-T10  Patient Location: PACU  Anesthesia Type:General  Level of Consciousness: awake, alert  and oriented  Airway & Oxygen Therapy: Patient Spontanous Breathing and Patient connected to nasal cannula oxygen  Post-op Assessment: Report given to RN, Post -op Vital signs reviewed and stable and Patient moving all extremities  Post vital signs: Reviewed and stable  Last Vitals:  Vitals:   04/18/16 1147 04/18/16 1607  BP: (!) 184/77 (!) 168/80  Pulse: 93   Resp: 18   Temp: 37.1 C 36.4 C    Last Pain:  Vitals:   04/18/16 1219  TempSrc:   PainSc: 10-Worst pain ever      Patients Stated Pain Goal: 3 ([id number])  Complications: No apparent anesthesia complications 2 hematomas forming at incision sites. Pressure held and Dr. Christella Noa at bedside.

## 2016-04-18 NOTE — Progress Notes (Signed)
Patient arrived to pacu with 2 egg size hematomas on back at incision sites. Dr Christella Noa notified and at bedside. RN to apply pressure and place ice packs. Will continue to monitor.

## 2016-04-19 ENCOUNTER — Encounter (HOSPITAL_COMMUNITY): Payer: Self-pay | Admitting: Neurosurgery

## 2016-04-19 DIAGNOSIS — M4854XA Collapsed vertebra, not elsewhere classified, thoracic region, initial encounter for fracture: Secondary | ICD-10-CM | POA: Diagnosis not present
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
## 2016-04-19 NOTE — Progress Notes (Signed)
Patient ID: ELYSSIA DIAB, female   DOB: February 24, 1941, 75 y.o.   MRN: [id number] BP (!) 131/58   Pulse 89   Temp 98.5 F (36.9 C)   Resp 18   Ht 5\' 2"  (1.575 m)   Wt 59 kg (130 lb)   SpO2 96%   BMI 23.78 kg/m  Alert and oriented x 4, speech is clear and fluent Moving all extremities Continued pain underneath left breast. Not ready for discharge at this time.  Not ambulating well pt tomorrow

## 2016-04-20 DIAGNOSIS — M4854XA Collapsed vertebra, not elsewhere classified, thoracic region, initial encounter for fracture: Secondary | ICD-10-CM | POA: Diagnosis not present

## 2016-04-20 MED ORDER — HEPARIN SODIUM (PORCINE) 5000 UNIT/ML IJ SOLN
5000.0000 [IU] | Freq: Three times a day (TID) | INTRAMUSCULAR | Status: DC
  Administered 2016-04-20 – 2016-04-21 (×3): 5000 [IU] via SUBCUTANEOUS
  Filled 2016-04-20 (×3): qty 1

## 2016-04-20 NOTE — Progress Notes (Signed)
Patient transferred from [place], A&O X4.
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
## 2016-04-20 NOTE — Progress Notes (Signed)
Clinical Social Worker contacted [employer] and requested an authorization for SNF placement for patient. CSW remains available for support until discharge.  Rhea Pink, MSW,  [place]

## 2016-04-20 NOTE — Evaluation (Signed)
Physical Therapy Evaluation Patient Details Name: Valerie Hanna MRN: [id number] DOB: [birth year] Today's Date: 04/20/2016   History of Present Illness  Pt is a 75 y/o female with a PMH significant for T8 and T10 closed compression fractures. Pt is now s/p kyphoplasty at T8 and T10 on 04/19/16.  Clinical Impression  Pt admitted with above diagnosis. Pt currently with functional limitations due to the deficits listed below (see PT Problem List). At the time of PT eval pt was able to perform transfers and ambulation with min guard to min assist. Feel pt would benefit from continued therapy at the SNF level at d/c. Discussed with pt and she agreed. Would like to go to [facility] in [place]. Pt will benefit from skilled PT to increase their independence and safety with mobility to allow discharge to the venue listed below.     Follow Up Recommendations SNF;Supervision/Assistance - 24 hour    Equipment Recommendations  None recommended by PT    Recommendations for Other Services       Precautions / Restrictions Precautions Precautions: Fall;Back Precaution Booklet Issued: Yes (comment) Precaution Comments: Reviewed precautions sheet Restrictions Weight Bearing Restrictions: No      Mobility  Bed Mobility Overal bed mobility: Needs Assistance Bed Mobility: Supine to Sit;Sit to Supine     Supine to sit: Min guard Sit to supine: Min assist   General bed mobility comments: VC's for log roll technique. Pt with poor follow through after education and required increased cues for maintenance of precautions.   Transfers Overall transfer level: Needs assistance Equipment used: Rolling walker (2 wheeled) Transfers: Sit to/from Stand Sit to Stand: Min assist         General transfer comment: Steadying assist as pt powered-up to full standing position.   Ambulation/Gait Ambulation/Gait assistance: Min assist Ambulation Distance (Feet): 75 Feet Assistive device: Rolling  walker (2 wheeled) Gait Pattern/deviations: Step-through pattern;Decreased stride length;Trunk flexed Gait velocity: Decreased Gait velocity interpretation: Below normal speed for age/gender General Gait Details: VC's for improved posture, pursed-lip breathing, and general energy conservation techniques. Pt on RA during ambulation and sats at 95% at end of ambulation although pt appeared SOB.  Stairs            Wheelchair Mobility    Modified Rankin (Stroke Patients Only)       Balance Overall balance assessment: Needs assistance Sitting-balance support: Feet supported;No upper extremity supported Sitting balance-Leahy Scale: Fair     Standing balance support: No upper extremity supported;During functional activity Standing balance-Leahy Scale: Poor                               Pertinent Vitals/Pain Pain Assessment: Faces Faces Pain Scale: Hurts little more Pain Location: Incision site Pain Descriptors / Indicators: Operative site guarding Pain Intervention(s): Limited activity within patient's tolerance;Monitored during session;Repositioned    Home Living Family/patient expects to be discharged to:: Skilled nursing facility Living Arrangements: Spouse/significant other Available Help at Discharge: Family;Available 24 hours/day Type of Home: House Home Access: Stairs to enter Entrance Stairs-Rails: Right Entrance Stairs-Number of Steps: 3 Home Layout: One level Home Equipment: Walker - 4 wheels;Cane - single point;Bedside commode;Grab bars - tub/shower      Prior Function Level of Independence: Independent with assistive device(s)         Comments: Household distances only     Hand Dominance   Dominant Hand: Right    Extremity/Trunk Assessment   Upper  Extremity Assessment: Defer to OT evaluation           Lower Extremity Assessment: Generalized weakness      Cervical / Trunk Assessment: Kyphotic  Communication   Communication:  No difficulties  Cognition Arousal/Alertness: Awake/alert Behavior During Therapy: WFL for tasks assessed/performed Overall Cognitive Status: Within Functional Limits for tasks assessed                      General Comments      Exercises     Assessment/Plan    PT Assessment Patient needs continued PT services  PT Problem List Decreased strength;Decreased range of motion;Decreased activity tolerance;Decreased balance;Decreased mobility;Decreased knowledge of use of DME;Decreased safety awareness;Decreased knowledge of precautions;Pain          PT Treatment Interventions DME instruction;Gait training;Stair training;Functional mobility training;Therapeutic activities;Therapeutic exercise;Neuromuscular re-education;Patient/family education    PT Goals (Current goals can be found in the Care Plan section)  Acute Rehab PT Goals Patient Stated Goal: Home after rehab at [facility] PT Goal Formulation: With patient Time For Goal Achievement: 04/27/16 Potential to Achieve Goals: Good    Frequency Min 5X/week   Barriers to discharge        Co-evaluation               End of Session Equipment Utilized During Treatment: Gait belt Activity Tolerance: Patient limited by fatigue;Treatment limited secondary to medical complications (Comment) (SOB) Patient left: in bed;with call bell/phone within reach Nurse Communication: Mobility status    Functional Assessment Tool Used: Clinical judgement Functional Limitation: Mobility: Walking and moving around Mobility: Walking and Moving Around Current Status [id number]): At least 20 percent but less than 40 percent impaired, limited or restricted Mobility: Walking and Moving Around Goal Status [phone]): At least 1 percent but less than 20 percent impaired, limited or restricted    Time: 0801-0820 PT Time Calculation (min) (ACUTE ONLY): 19 min   Charges:   PT Evaluation $PT Eval Moderate Complexity: 1 Procedure     PT G  Codes:   PT G-Codes **NOT FOR INPATIENT CLASS** Functional Assessment Tool Used: Clinical judgement Functional Limitation: Mobility: Walking and moving around Mobility: Walking and Moving Around Current Status [id number]): At least 20 percent but less than 40 percent impaired, limited or restricted Mobility: Walking and Moving Around Goal Status [phone]): At least 1 percent but less than 20 percent impaired, limited or restricted    Thelma Comp 04/20/2016, 9:54 AM   Rolinda Roan, PT, DPT Acute Rehabilitation Services Pager: [phone]

## 2016-04-20 NOTE — Progress Notes (Signed)
Patient ID: Valerie Hanna, female   DOB: 10/27/1940, 75 y.o.   MRN: [id number] BP (!) 142/64 (BP Location: Left Arm)   Pulse 88   Temp 98.3 F (36.8 C) (Oral)   Resp 20   Ht 5\' 2"  (1.575 m)   Wt 59 kg (130 lb)   SpO2 98%   BMI 23.78 kg/m  Alert and oriented Complaining of pain just underneath left breast Will do xray tomorrow. Moving lower extremities well. Bowel and bladder function normal.

## 2016-04-20 NOTE — Progress Notes (Signed)
Report called to RN on 15M. Pt transferred to 15M13 d/t Pt requiring SNF placement and will need to stay in the hospital until Monday. Pt transferred by wheelchair and is stable @ transfer. Holli Humbles, RN

## 2016-04-20 NOTE — Clinical Social Work Placement (Signed)
   CLINICAL SOCIAL WORK PLACEMENT  NOTE  Date:  04/20/2016  Patient Details  Name: KIZZEY ARMBRUST MRN: [id number] Date of Birth: 1940-07-24  Clinical Social Work is seeking post-discharge placement for this patient at the [place] level of care (*CSW will initial, date and re-position this form in  chart as items are completed):  Yes   Patient/family provided with [place] Work Department's list of facilities offering this level of care within the geographic area requested by the patient (or if unable, by the patient's family).  Yes   Patient/family informed of their freedom to choose among providers that offer the needed level of care, that participate in Medicare, Medicaid or managed care program needed by the patient, have an available bed and are willing to accept the patient.  Yes   Patient/family informed of [facility]'s ownership interest in [facility] and [facility], as well as of the fact that they are under no obligation to receive care at these facilities.  PASRR submitted to EDS on 04/20/16     PASRR number received on       Existing PASRR number confirmed on       FL2 transmitted to all facilities in geographic area requested by pt/family on 04/20/16     FL2 transmitted to all facilities within larger geographic area on       Patient informed that his/her managed care company has contracts with or will negotiate with certain facilities, including the following:            Patient/family informed of bed offers received.  Patient chooses bed at       Physician recommends and patient chooses bed at      Patient to be transferred to   on  .  Patient to be transferred to facility by       Patient family notified on   of transfer.  Name of family member notified:        PHYSICIAN Please prepare priority discharge summary, including medications, Please prepare prescriptions, Please sign FL2     Additional Comment:     _______________________________________________ Rigoberto Noel, LCSW 04/20/2016, 10:25 AM

## 2016-04-21 ENCOUNTER — Observation Stay (HOSPITAL_COMMUNITY): Payer: PPO

## 2016-04-21 DIAGNOSIS — S22000A Wedge compression fracture of unspecified thoracic vertebra, initial encounter for closed fracture: Secondary | ICD-10-CM | POA: Diagnosis not present

## 2016-04-21 DIAGNOSIS — M4854XA Collapsed vertebra, not elsewhere classified, thoracic region, initial encounter for fracture: Secondary | ICD-10-CM | POA: Diagnosis not present

## 2016-04-21 MED ORDER — ACETAMINOPHEN 325 MG PO TABS
650.0000 mg | ORAL_TABLET | Freq: Four times a day (QID) | ORAL | Status: DC | PRN
  Administered 2016-04-21: 650 mg via ORAL
  Filled 2016-04-21: qty 2

## 2016-04-21 MED ORDER — OXYCODONE HCL 5 MG PO TABS: 5.0000 mg | ORAL_TABLET | Freq: Four times a day (QID) | ORAL | 0 refills | Status: DC | PRN

## 2016-04-21 NOTE — Progress Notes (Addendum)
Pt agreeable to discharging home but is now refusing all [facility] services.  CM has notified charge RN, Levada Dy.  No other CM needs were communicated.  CM has requested [facility] orders and face to face as pt has already chosen [facility] to render [facility] services.  Referral has been called to [facility] rep, Jermaine who is waiting for orders and F2F for [facility].  CM confirmed with pt she has a rolling walker and a 3n1 at home and her husband will be providing transportation home. No other CM needs were communicated.  CM met with pt to discuss continued stay and pt states she thinks she would rather go to SNF if it's paid for by insurance.  CM discussed with pt an alternative plan of home with home health in case SNF does not work out and pt states she would like for [facility] to render [facility] services if she does not go to SNF.  CM has requested [facility] orders including a SW for safety checks.  CM will continue to follow.

## 2016-04-21 NOTE — Progress Notes (Signed)
Patient ID: BAILEY SPELLS, female   DOB: 1941-03-13, 75 y.o.   MRN: [id number] Subjective:  The patient is alert and pleasant. She complains of back pain.  Objective: Vital signs in last 24 hours: Temp:  [97.7 F (36.5 C)-98.7 F (37.1 C)] 98.4 F (36.9 C) (11/18 0550) Pulse Rate:  [80-93] 93 (11/18 0550) Resp:  [16-20] 18 (11/18 0550) BP: (134-149)/(63-80) 134/63 (11/18 0550) SpO2:  [98 %-100 %] 100 % (11/18 0550) FiO2 (%):  [2 %] 2 % (11/18 0550)  Intake/Output from previous day: 11/17 0701 - 11/18 0700 In: 240 [P.O.:240] Out: -  Intake/Output this shift: No intake/output data recorded.  Physical exam patient is alert and oriented. She is moving her lower extremities well.  Lab Results: No results for input(s): WBC, HGB, HCT, PLT in the last 72 hours. BMET No results for input(s): NA, K, CL, CO2, GLUCOSE, BUN, CREATININE, CALCIUM in the last 72 hours.  Studies/Results: No results found.  Assessment/Plan: Status post thoracic kyphoplasty: We will mobilize the patient.   LOS: 0 days     Maximos Zayas D 04/21/2016, 7:06 AM

## 2016-04-21 NOTE — Progress Notes (Signed)
Went over d/c home instr. Has Rx for pain med. Husband on his way to take her home.

## 2016-04-21 NOTE — Progress Notes (Signed)
Took IS into room, instructed in use. Will need more teaching and encouragement to use it.

## 2016-04-21 NOTE — Discharge Summary (Signed)
Date of Admission: 04/18/2016  Date of Discharge: 04/21/16  PRE-OPERATIVE DIAGNOSIS:  CLOSED COMPRESSION FRACTURE OF THORACIC VERTEBA T8, and T10  POST-OPERATIVE DIAGNOSIS:  CLOSED COMPRESSION FRACTURE OF THORACIC VERTEBA T8, and T10  PROCEDURE:  Procedure(s): KYPHOPLASTY Thoracic eight-Thoracic ten  Attending: Ashok Pall, MD  Hospital Course:  The patient was admitted for the above listed operation and had an uncomplicated post-operative course.  They were discharged in stable condition.  Follow up: 3 weeks    Medication List    TAKE these medications   ALPRAZolam 0.25 MG tablet Commonly known as:  XANAX Take 1 tablet (0.25 mg total) by mouth at bedtime as needed for anxiety.   CALCIUM PO Take 1 tablet by mouth daily.   clopidogrel 75 MG tablet Commonly known as:  PLAVIX TAKE 1 TABLET BY MOUTH EVERY DAY   Ergocalciferol 2000 units Tabs Take 1 tablet by mouth daily.   fexofenadine 60 MG tablet Commonly known as:  ALLEGRA Take 60 mg by mouth daily.   folic acid 1 MG tablet Commonly known as:  FOLVITE Take 1 tablet (1 mg total) by mouth daily.   ipratropium 0.06 % nasal spray Commonly known as:  ATROVENT USE 2 SPRAYS IN EACH NOSTRIL 2 TIMES A DAY   lidocaine 5 % Commonly known as:  LIDODERM Place 1 patch onto the skin daily.   mirtazapine 30 MG tablet Commonly known as:  REMERON Take 1 tablet (30 mg total) by mouth at bedtime.   multivitamin with minerals Tabs tablet Take 1 tablet by mouth daily.   oxycodone 5 MG capsule Commonly known as:  OXY-IR Take 5 mg by mouth every 6 (six) hours as needed for pain. What changed:  Another medication with the same name was added. Make sure you understand how and when to take each.   oxyCODONE 5 MG immediate release tablet Commonly known as:  Oxy IR/ROXICODONE Take 1 tablet (5 mg total) by mouth every 6 (six) hours as needed for moderate pain. What changed:  You were already taking a medication with the same  name, and this prescription was added. Make sure you understand how and when to take each.   pantoprazole 40 MG tablet Commonly known as:  PROTONIX TAKE 1 TABLET BY MOUTH TWICE DAILY   senna-docusate 8.6-50 MG tablet Commonly known as:  Senokot-S Take 1 tablet by mouth 2 (two) times daily.   sertraline 100 MG tablet Commonly known as:  ZOLOFT Take 1 tablet (100 mg total) by mouth at bedtime.   simvastatin 40 MG tablet Commonly known as:  ZOCOR Take 1 tablet (40 mg total) by mouth daily at 6 PM.   traMADol 50 MG tablet Commonly known as:  ULTRAM Take 1 tablet (50 mg total) by mouth every 8 (eight) hours as needed. What changed:  reasons to take this   traZODone 50 MG tablet Commonly known as:  DESYREL TAKE 1 TABLET(50 MG) BY MOUTH AT BEDTIME   VITAMIN B-12 PO Take 1 tablet by mouth daily.

## 2016-04-21 NOTE — Progress Notes (Signed)
Physical Therapy Treatment Patient Details Name: Valerie Hanna MRN: [id number] DOB: 02/25/1941 Today's Date: 04/21/2016    History of Present Illness Pt is a 75 y/o female with a PMH significant for T8 and T10 closed compression fractures. Pt is now s/p kyphoplasty at T8 and T10 on 04/19/16.    PT Comments    Patient requiring min guard assist for transfers and gait.  Spoke with patient about benefits of f/u therapy for mobility and safety.  Patient declined both SNF and HHPT.  Reports she is moving about the way she has been at home.  Will continue to follow while in hospital.   Follow Up Recommendations  Home health PT;Supervision/Assistance - 24 hour  (Patient declining SNF and HHPT)     Equipment Recommendations  None recommended by PT    Recommendations for Other Services       Precautions / Restrictions Precautions Precautions: Fall;Back Precaution Comments: Reviewed precautions Restrictions Weight Bearing Restrictions: No    Mobility  Bed Mobility Overal bed mobility: Needs Assistance Bed Mobility: Supine to Sit     Supine to sit: Supervision     General bed mobility comments: Supervision for safety.  No physical assist needed.  Transfers Overall transfer level: Needs assistance Equipment used: Rolling walker (2 wheeled) Transfers: Sit to/from Stand Sit to Stand: Min guard         General transfer comment: Assist for balance/safety during transfer.  Ambulation/Gait Ambulation/Gait assistance: Min guard Ambulation Distance (Feet): 110 Feet Assistive device: Rolling walker (2 wheeled) Gait Pattern/deviations: Step-through pattern;Decreased stride length;Shuffle;Trunk flexed Gait velocity: decreased Gait velocity interpretation: Below normal speed for age/gender General Gait Details: Patient with flexed posture with spine curved.  Verbal cues for safe use of RW, and energy conservation techniques.  Ambulated on room air with minimal dyspnea (1/4).      Stairs            Wheelchair Mobility    Modified Rankin (Stroke Patients Only)       Balance           Standing balance support: Single extremity supported;During functional activity Standing balance-Leahy Scale: Poor                      Cognition Arousal/Alertness: Awake/alert Behavior During Therapy: WFL for tasks assessed/performed Overall Cognitive Status: Within Functional Limits for tasks assessed                      Exercises      General Comments        Pertinent Vitals/Pain Pain Assessment: 0-10 Pain Score: 8  Pain Location: back Pain Descriptors / Indicators: Aching;Sore Pain Intervention(s): Limited activity within patient's tolerance;Monitored during session;Repositioned    Home Living                      Prior Function            PT Goals (current goals can now be found in the care plan section) Acute Rehab PT Goals Patient Stated Goal: Home instead of SNF Progress towards PT goals: Progressing toward goals    Frequency    Min 5X/week      PT Plan Discharge plan needs to be updated    Co-evaluation             End of Session Equipment Utilized During Treatment: Gait belt Activity Tolerance: Patient limited by pain;Patient limited by fatigue Patient left: in chair;with call  bell/phone within reach;with chair alarm set     Time: 1440-1454 PT Time Calculation (min) (ACUTE ONLY): 14 min  Charges:  $Gait Training: 8-22 mins                    G Codes:      Despina Pole 05-18-2016, 3:03 PM Carita Pian. Sanjuana Kava, [place] Pager [phone]
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
## 2016-04-21 NOTE — Progress Notes (Signed)
Home now w/husband.

## 2016-05-08 ENCOUNTER — Encounter: Payer: Self-pay | Admitting: Internal Medicine

## 2016-05-08 ENCOUNTER — Ambulatory Visit (INDEPENDENT_AMBULATORY_CARE_PROVIDER_SITE_OTHER): Payer: PPO | Admitting: Internal Medicine

## 2016-05-08 VITALS — BP 143/80 | HR 84 | Temp 97.6°F | Resp 16 | Ht 62.0 in | Wt 137.0 lb

## 2016-05-08 DIAGNOSIS — J40 Bronchitis, not specified as acute or chronic: Secondary | ICD-10-CM

## 2016-05-08 DIAGNOSIS — M4854XA Collapsed vertebra, not elsewhere classified, thoracic region, initial encounter for fracture: Secondary | ICD-10-CM

## 2016-05-08 DIAGNOSIS — S22000A Wedge compression fracture of unspecified thoracic vertebra, initial encounter for closed fracture: Secondary | ICD-10-CM

## 2016-05-08 MED ORDER — AZITHROMYCIN 250 MG PO TABS: ORAL_TABLET | ORAL | 0 refills | Status: DC

## 2016-05-08 MED ORDER — OXYCODONE HCL 5 MG PO TABS: 5.0000 mg | ORAL_TABLET | Freq: Four times a day (QID) | ORAL | 0 refills | Status: DC | PRN

## 2016-05-08 MED ORDER — ALBUTEROL SULFATE (2.5 MG/3ML) 0.083% IN NEBU
2.5000 mg | INHALATION_SOLUTION | Freq: Once | RESPIRATORY_TRACT | Status: AC
  Administered 2016-05-08: 2.5 mg via RESPIRATORY_TRACT
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
## 2016-05-14 ENCOUNTER — Telehealth: Payer: Self-pay | Admitting: Internal Medicine

## 2016-05-14 ENCOUNTER — Other Ambulatory Visit: Payer: Self-pay | Admitting: Internal Medicine

## 2016-05-14 MED ORDER — PANTOPRAZOLE SODIUM 40 MG PO TBEC: 40.0000 mg | DELAYED_RELEASE_TABLET | Freq: Two times a day (BID) | ORAL | 12 refills | Status: DC

## 2016-05-14 NOTE — Telephone Encounter (Signed)
She does not need 2 zpaks.  She only needed to get the one from [facility].

## 2016-05-14 NOTE — Telephone Encounter (Signed)
I can not reach at either number.

## 2016-05-14 NOTE — Telephone Encounter (Signed)
Pt called said she needs a refill on her pantoprazole 40mg  [facility] [place], pt also asked did she need to pick her up her [facility]pak at the [facility], pt picked up one already from [facility].

## 2016-05-17 DIAGNOSIS — S22000A Wedge compression fracture of unspecified thoracic vertebra, initial encounter for closed fracture: Secondary | ICD-10-CM | POA: Diagnosis not present

## 2016-05-18 ENCOUNTER — Other Ambulatory Visit: Payer: Self-pay | Admitting: Internal Medicine

## 2016-06-12 ENCOUNTER — Telehealth: Payer: Self-pay | Admitting: *Deleted

## 2016-06-12 ENCOUNTER — Other Ambulatory Visit: Payer: Self-pay | Admitting: Internal Medicine

## 2016-06-12 NOTE — Telephone Encounter (Signed)
I did not receive any request for nasal spray.

## 2016-06-12 NOTE — Telephone Encounter (Signed)
Patient called and stated that [facility] [place] [facility] over a med refill request yesterday for her nasal spray. Patient states she went to go pick it up today and it was not there. Patient is requesting a call back. Her number is [phone]. Please advise.provider. Thank you

## 2016-06-13 ENCOUNTER — Other Ambulatory Visit: Payer: Self-pay | Admitting: Internal Medicine

## 2016-06-13 MED ORDER — IPRATROPIUM BROMIDE 0.06 % NA SOLN: NASAL | 0 refills | Status: DC

## 2016-06-13 NOTE — Telephone Encounter (Signed)
Called pt told her we have not received any refill request on nasal spray from [place]

## 2016-06-14 ENCOUNTER — Other Ambulatory Visit: Payer: Self-pay | Admitting: Internal Medicine

## 2016-07-09 ENCOUNTER — Other Ambulatory Visit: Payer: Self-pay | Admitting: Internal Medicine

## 2016-07-11 ENCOUNTER — Other Ambulatory Visit: Payer: Self-pay | Admitting: Internal Medicine

## 2016-07-11 DIAGNOSIS — E785 Hyperlipidemia, unspecified: Secondary | ICD-10-CM

## 2016-07-11 DIAGNOSIS — F324 Major depressive disorder, single episode, in partial remission: Secondary | ICD-10-CM

## 2016-07-11 MED ORDER — SERTRALINE HCL 100 MG PO TABS: 100.0000 mg | ORAL_TABLET | Freq: Every day | ORAL | 1 refills | Status: DC

## 2016-07-11 MED ORDER — TRAZODONE HCL 50 MG PO TABS: 50.0000 mg | ORAL_TABLET | Freq: Every day | ORAL | 1 refills | Status: DC

## 2016-07-11 MED ORDER — SIMVASTATIN 40 MG PO TABS: 40.0000 mg | ORAL_TABLET | Freq: Every day | ORAL | 1 refills | Status: DC

## 2016-07-20 ENCOUNTER — Encounter: Payer: Self-pay | Admitting: Internal Medicine

## 2016-07-20 ENCOUNTER — Ambulatory Visit (INDEPENDENT_AMBULATORY_CARE_PROVIDER_SITE_OTHER): Payer: PPO | Admitting: Internal Medicine

## 2016-07-20 VITALS — BP 138/82 | HR 93 | Temp 97.6°F | Ht 62.0 in | Wt 116.0 lb

## 2016-07-20 DIAGNOSIS — F324 Major depressive disorder, single episode, in partial remission: Secondary | ICD-10-CM | POA: Diagnosis not present

## 2016-07-20 DIAGNOSIS — F411 Generalized anxiety disorder: Secondary | ICD-10-CM | POA: Diagnosis not present

## 2016-07-20 DIAGNOSIS — E785 Hyperlipidemia, unspecified: Secondary | ICD-10-CM | POA: Diagnosis not present

## 2016-07-20 DIAGNOSIS — K2270 Barrett's esophagus without dysplasia: Secondary | ICD-10-CM

## 2016-07-20 DIAGNOSIS — M4854XA Collapsed vertebra, not elsewhere classified, thoracic region, initial encounter for fracture: Secondary | ICD-10-CM

## 2016-07-20 DIAGNOSIS — D485 Neoplasm of uncertain behavior of skin: Secondary | ICD-10-CM | POA: Diagnosis not present

## 2016-07-20 DIAGNOSIS — S22000A Wedge compression fracture of unspecified thoracic vertebra, initial encounter for closed fracture: Secondary | ICD-10-CM

## 2016-07-20 DIAGNOSIS — H1032 Unspecified acute conjunctivitis, left eye: Secondary | ICD-10-CM

## 2016-07-20 MED ORDER — NEOMYCIN-POLYMYXIN-DEXAMETH 3.5-10000-0.1 OP SUSP: 2.0000 [drp] | Freq: Four times a day (QID) | OPHTHALMIC | 0 refills | Status: DC

## 2016-07-20 MED ORDER — ALPRAZOLAM 0.25 MG PO TABS: 0.2500 mg | ORAL_TABLET | Freq: Every evening | ORAL | 5 refills | Status: DC | PRN

## 2016-07-20 MED ORDER — MIRTAZAPINE 30 MG PO TABS: 30.0000 mg | ORAL_TABLET | Freq: Every day | ORAL | 5 refills | Status: DC
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
## 2016-07-20 NOTE — Patient Instructions (Signed)
Schedule appt with Dermatology  I will call about the esophageal tests from 2013.

## 2016-07-21 LAB — CBC WITH DIFFERENTIAL/PLATELET
BASOS ABS: 0 10*3/uL (ref 0.0–0.2)
BASOS: 1 %
EOS (ABSOLUTE): 0.1 10*3/uL (ref 0.0–0.4)
Eos: 2 %
Hematocrit: 41.8 % (ref 34.0–46.6)
Hemoglobin: 13.7 g/dL (ref 11.1–15.9)
IMMATURE GRANS (ABS): 0 10*3/uL (ref 0.0–0.1)
Immature Granulocytes: 0 %
LYMPHS: 30 %
Lymphocytes Absolute: 2.3 10*3/uL (ref 0.7–3.1)
MCH: 34.2 pg — ABNORMAL HIGH (ref 26.6–33.0)
MCHC: 32.8 g/dL (ref 31.5–35.7)
MCV: 104 fL — AB (ref 79–97)
Monocytes Absolute: 0.6 10*3/uL (ref 0.1–0.9)
Monocytes: 8 %
NEUTROS PCT: 59 %
Neutrophils Absolute: 4.7 10*3/uL (ref 1.4–7.0)
PLATELETS: 279 10*3/uL (ref 150–379)
RBC: 4.01 x10E6/uL (ref 3.77–5.28)
RDW: 12.7 % (ref 12.3–15.4)
WBC: 7.8 10*3/uL (ref 3.4–10.8)

## 2016-07-21 LAB — COMPREHENSIVE METABOLIC PANEL
ALT: 25 IU/L (ref 0–32)
AST: 39 IU/L (ref 0–40)
Albumin/Globulin Ratio: 1.6 (ref 1.2–2.2)
Albumin: 4.5 g/dL (ref 3.5–4.8)
Alkaline Phosphatase: 92 IU/L (ref 39–117)
BUN/Creatinine Ratio: 20 (ref 12–28)
BUN: 10 mg/dL (ref 8–27)
Bilirubin Total: 0.3 mg/dL (ref 0.0–1.2)
CALCIUM: 9.9 mg/dL (ref 8.7–10.3)
CO2: 27 mmol/L (ref 18–29)
CREATININE: 0.49 mg/dL — AB (ref 0.57–1.00)
Chloride: 101 mmol/L (ref 96–106)
GFR, EST AFRICAN AMERICAN: 112 mL/min/{1.73_m2} (ref >59)
GFR, EST NON AFRICAN AMERICAN: 97 mL/min/{1.73_m2} (ref >59)
GLUCOSE: 98 mg/dL (ref 65–99)
Globulin, Total: 2.8 g/dL (ref 1.5–4.5)
Potassium: 4.7 mmol/L (ref 3.5–5.2)
Sodium: 143 mmol/L (ref 134–144)
TOTAL PROTEIN: 7.3 g/dL (ref 6.0–8.5)

## 2016-07-21 LAB — LIPID PANEL
CHOL/HDL RATIO: 2 ratio (ref 0.0–4.4)
Cholesterol, Total: 208 mg/dL — ABNORMAL HIGH (ref 100–199)
HDL: 103 mg/dL (ref >39)
LDL Calculated: 75 mg/dL (ref 0–99)
TRIGLYCERIDES: 152 mg/dL — AB (ref 0–149)
VLDL Cholesterol Cal: 30 mg/dL (ref 5–40)

## 2016-07-21 LAB — TSH: TSH: 0.947 u[IU]/mL (ref 0.450–4.500)

## 2016-07-23 ENCOUNTER — Encounter: Payer: Self-pay | Admitting: Internal Medicine

## 2016-07-23 NOTE — Progress Notes (Signed)
Please call patient and let her know that she does not need to see GI again.  She does not have a pre-cancerous finding in her stomach and esophagus (reviewed records from 2013).  She should continue all her same medications.

## 2016-07-24 ENCOUNTER — Telehealth: Payer: Self-pay

## 2016-07-24 DIAGNOSIS — H903 Sensorineural hearing loss, bilateral: Secondary | ICD-10-CM | POA: Diagnosis not present

## 2016-07-24 DIAGNOSIS — H6121 Impacted cerumen, right ear: Secondary | ICD-10-CM | POA: Diagnosis not present

## 2016-07-24 NOTE — Telephone Encounter (Signed)
Advised patient do not need GI appt. [facility]

## 2016-07-30 ENCOUNTER — Telehealth: Payer: Self-pay | Admitting: Internal Medicine

## 2016-07-30 ENCOUNTER — Other Ambulatory Visit: Payer: Self-pay | Admitting: Internal Medicine

## 2016-07-30 DIAGNOSIS — D485 Neoplasm of uncertain behavior of skin: Secondary | ICD-10-CM

## 2016-07-30 NOTE — Telephone Encounter (Signed)
Pt called asking if we could referral her to a skin doctor

## 2016-07-30 NOTE — Telephone Encounter (Signed)
Done

## 2016-08-07 DIAGNOSIS — D485 Neoplasm of uncertain behavior of skin: Secondary | ICD-10-CM | POA: Diagnosis not present

## 2016-08-07 DIAGNOSIS — L738 Other specified follicular disorders: Secondary | ICD-10-CM | POA: Diagnosis not present

## 2016-08-07 DIAGNOSIS — L98499 Non-pressure chronic ulcer of skin of other sites with unspecified severity: Secondary | ICD-10-CM | POA: Diagnosis not present

## 2016-08-21 DIAGNOSIS — L738 Other specified follicular disorders: Secondary | ICD-10-CM | POA: Diagnosis not present

## 2016-08-21 DIAGNOSIS — L988 Other specified disorders of the skin and subcutaneous tissue: Secondary | ICD-10-CM | POA: Diagnosis not present

## 2016-09-04 DIAGNOSIS — L738 Other specified follicular disorders: Secondary | ICD-10-CM | POA: Diagnosis not present

## 2016-09-04 DIAGNOSIS — L988 Other specified disorders of the skin and subcutaneous tissue: Secondary | ICD-10-CM | POA: Diagnosis not present

## 2016-09-04 DIAGNOSIS — L7211 Pilar cyst: Secondary | ICD-10-CM | POA: Diagnosis not present

## 2016-10-31 ENCOUNTER — Other Ambulatory Visit: Payer: Self-pay | Admitting: Internal Medicine

## 2016-11-22 ENCOUNTER — Other Ambulatory Visit: Payer: Self-pay

## 2016-11-22 MED ORDER — IPRATROPIUM BROMIDE 0.06 % NA SOLN: NASAL | 12 refills | Status: DC

## 2016-12-12 ENCOUNTER — Other Ambulatory Visit: Payer: Self-pay | Admitting: Internal Medicine

## 2017-01-08 ENCOUNTER — Other Ambulatory Visit: Payer: Self-pay | Admitting: Internal Medicine

## 2017-01-14 ENCOUNTER — Ambulatory Visit (INDEPENDENT_AMBULATORY_CARE_PROVIDER_SITE_OTHER): Payer: PPO

## 2017-01-14 VITALS — BP 152/82 | HR 64 | Temp 98.7°F | Resp 16 | Ht 62.0 in | Wt 126.4 lb

## 2017-01-14 DIAGNOSIS — Z0000 Encounter for general adult medical examination without abnormal findings: Secondary | ICD-10-CM

## 2017-01-14 NOTE — Progress Notes (Signed)
Subjective:   Valerie Hanna is a 76 y.o. female who presents for Medicare Annual (Subsequent) preventive examination.  Review of Systems:  Cardiac Risk Factors include: advanced age (>46men, >31 women);dyslipidemia     Objective:     Vitals: BP (!) 152/82 (BP Location: Right Arm, Patient Position: Sitting)   Pulse 64   Temp 98.7 F (37.1 C)   Resp 16   Ht 5\' 2"  (1.575 m)   Wt 126 lb 6.4 oz (57.3 kg)   BMI 23.12 kg/m   Body mass index is 23.12 kg/m.   Tobacco History  Smoking Status  . Former Smoker  . Packs/day: 0.50  . Years: 30.00  . Quit date: 04/15/2016  Smokeless Tobacco  . Never Used     Counseling given: Not Answered   Past Medical History:  Diagnosis Date  . Allergy   . Anemia   . Anxiety   . Arthritis   . Cancer ([place])    melanoma  ON HER BACK MANY YRS AGO  . Constipation   . Depression   . GERD (gastroesophageal reflux disease)   . H/O hiatal hernia   . Headache(784.0)   . High cholesterol   . History of kidney infection   . Hypertension    no longer taking meds since stroke  . Osteoporosis   . Pleurisy    being treated for this now 01/25/14 (left side)  . Stroke ([place])    2014, WITH LEFT SIDED WEAKNESS   Past Surgical History:  Procedure Laterality Date  . ABDOMINAL HYSTERECTOMY    . APPENDECTOMY    . BACK SURGERY     kyphoplasty  . bladder tack    . CHOLECYSTECTOMY    . COLONOSCOPY    . DILATION AND CURETTAGE OF UTERUS    . EYE SURGERY Right    cataract removed  . EYE SURGERY Right    "bubble" put in for a hole in retina  . FOOT SURGERY Bilateral   . KYPHOPLASTY N/A 01/28/2014   Procedure: LUMBAR FOUR KYPHOPLASTY;  Surgeon: Ashok Pall, MD;  Location: [place] [facility];  Service: Neurosurgery;  Laterality: N/A;  L4 Kyphoplasty  . KYPHOPLASTY N/A 04/18/2016   Procedure: KYPHOPLASTY Thoracic eight-Thoracic ten;  Surgeon: Ashok Pall, MD;  Location: [place] [facility];  Service: Neurosurgery;  Laterality: N/A;  KYPHOPLASTY T8-T10  . TOTAL HIP  ARTHROPLASTY Right 05/26/2015   Procedure: RIGHT BIPOLAR VS TOTAL HIP ANTERIOR APPROACH;  Surgeon: Mcarthur Rossetti, MD;  Location: [facility];  Service: Orthopedics;  Laterality: Right;   Family History  Problem Relation Age of Onset  . Family history unknown: Yes   History  Sexual Activity  . Sexual activity: Not on file    Outpatient Encounter Prescriptions as of 01/14/2017  Medication Sig  . ALPRAZolam (XANAX) 0.25 MG tablet Take 1 tablet (0.25 mg total) by mouth at bedtime as needed for anxiety.  Marland Kitchen CALCIUM PO Take 1 tablet by mouth daily.  . clopidogrel (PLAVIX) 75 MG tablet TAKE 1 TABLET BY MOUTH EVERY DAY  . Cyanocobalamin (VITAMIN B-12 PO) Take 1 tablet by mouth daily.  . Ergocalciferol 2000 UNITS TABS Take 1 tablet by mouth daily.  . fexofenadine (ALLEGRA) 60 MG tablet Take 60 mg by mouth daily.  Marland Kitchen ipratropium (ATROVENT) 0.06 % nasal spray USE 2 SPRAYS IN EACH NOSTRIL 2 TIMES A DAY  . mirtazapine (REMERON) 30 MG tablet Take 1 tablet (30 mg total) by mouth at bedtime.  . Multiple Vitamin (MULTIVITAMIN WITH MINERALS)  TABS tablet Take 1 tablet by mouth daily.  . pantoprazole (PROTONIX) 40 MG tablet Take 1 tablet (40 mg total) by mouth 2 (two) times daily.  . sertraline (ZOLOFT) 100 MG tablet Take 1 tablet (100 mg total) by mouth at bedtime.  . simvastatin (ZOCOR) 40 MG tablet Take 1 tablet (40 mg total) by mouth daily at 6 PM.  . traZODone (DESYREL) 50 MG tablet TAKE 1 TABLET (50 MG TOTAL) BY MOUTH AT BEDTIME.  Marland Kitchen lidocaine (LIDODERM) 5 % Place 1 patch onto the skin daily. (Patient not taking: Reported on 01/14/2017)  . neomycin-polymyxin b-dexamethasone (MAXITROL) 3.5-10000-0.1 SUSP Place 2 drops into both eyes every 6 (six) hours. (Patient not taking: Reported on 01/14/2017)  . [DISCONTINUED] folic acid (FOLVITE) 1 MG tablet Take 1 tablet (1 mg total) by mouth daily. (Patient not taking: Reported on 01/14/2017)  . [DISCONTINUED] ipratropium (ATROVENT) 0.06 % nasal spray USE 2 SPRAYS  IN EACH NOSTRIL 2 TIMES A DAY (Patient not taking: Reported on 01/14/2017)  . [DISCONTINUED] oxyCODONE (OXY IR/ROXICODONE) 5 MG immediate release tablet Take 1 tablet (5 mg total) by mouth every 6 (six) hours as needed for moderate pain. (Patient not taking: Reported on 01/14/2017)   No facility-administered encounter medications on file as of 01/14/2017.     Activities of Daily Living In your present state of health, do you have any difficulty performing the following activities: 01/14/2017 04/16/2016  Hearing? N N  Vision? Y N  Comment - OD BLURRY SINCE OIL INJECTED   Difficulty concentrating or making decisions? N N  Walking or climbing stairs? Y Y  Dressing or bathing? N N  Doing errands, shopping? N Y  Conservation officer, nature and eating ? N -  Using the Toilet? N -  In the past six months, have you accidently leaked urine? N -  Do you have problems with loss of bowel control? N -  Managing your Medications? N -  Managing your Finances? N -  Housekeeping or managing your Housekeeping? N -  Some recent data might be hidden    Patient Care Team: Glean Hess, MD as PCP - General (Family Medicine) Garrel Ridgel, [place] as Consulting Physician (Podiatry) Ashok Pall, MD as Consulting Physician (Neurosurgery)    Assessment:     Exercise Activities and Dietary recommendations Current Exercise Habits: The patient does not participate in regular exercise at present, Exercise limited by: None identified  Goals    . Increase water intake          Recommend drinking at least 6-8 glasses of water a day      Fall Risk Fall Risk  01/14/2017 07/20/2015 12/01/2014  Falls in the past year? No Yes No  Number falls in past yr: - 2 or more -  Injury with Fall? - Yes -  Risk Factor Category  - High Fall Risk -  Follow up - Falls evaluation completed -   Depression Screen PHQ 2/9 Scores 01/14/2017 07/20/2015 12/01/2014  PHQ - 2 Score 0 3 3  PHQ- 9 Score - 8 7     Cognitive Function     6CIT  Screen 01/14/2017  What Year? 0 points  What month? 0 points  What time? 0 points  Count back from 20 0 points  Months in reverse 0 points  Repeat phrase 4 points  Total Score 4     There is no immunization history on file for this patient. Screening Tests Health Maintenance  Topic Date Due  .  PNA vac Low Risk Adult (2 of 2 - PPSV23) 02/02/2018 (Originally 12/01/2015)  . MAMMOGRAM  06/04/2018 (Originally 03/05/2014)  . INFLUENZA VACCINE  06/05/2023 (Originally 01/02/2017)  . COLONOSCOPY  06/20/2021  . TETANUS/TDAP  11/30/2024      Plan:      I have personally reviewed and addressed the Medicare Annual Wellness questionnaire and have noted the following in the patient's chart:  A. Medical and social history B. Use of alcohol, tobacco or illicit drugs  C. Current medications and supplements D. Functional ability and status E.  Nutritional status F.  Physical activity G. Advance directives H. List of other physicians I.  Hospitalizations, surgeries, and ER visits in previous 12 months J.  [place] such as hearing and vision if needed, cognitive and depression L. Referrals and appointments  In addition, I have reviewed and discussed with patient certain preventive protocols, quality metrics, and best practice recommendations. A written personalized care plan for preventive services as well as general preventive health recommendations were provided to patient.   Signed,  Tyler Aas, LPN Nurse Health Advisor   MD Recommendations: none

## 2017-01-14 NOTE — Patient Instructions (Addendum)
Valerie Hanna , Thank you for taking time to come for your Medicare Wellness Visit. I appreciate your ongoing commitment to your health goals. Please review the following plan we discussed and let me know if I can assist you in the future.   Screening recommendations/referrals: Colonoscopy: completed 09/05/2011 Mammogram: due now- declined  Bone Density: completed 07/14/2008 Recommended yearly ophthalmology/optometry visit for glaucoma screening and checkup Recommended yearly dental visit for hygiene and checkup  Vaccinations: Influenza vaccine: due 02/2017 Pneumococcal vaccine: declined Tdap vaccine: up to date Shingles vaccine: due, check with your insurance company for coverage  Advanced directives: Please bring a copy of your health care power of attorney and living will to the office at your convenience.  Conditions/risks identified: Recommend drinking at least 6-8 glasses of water a day  Next appointment: Follow up on 01/18/2017 at 1:30pm with Dr.Berglund. Follow up in one year for your annual wellness exam.   Preventive Care 74 Years and Older, Female Preventive care refers to lifestyle choices and visits with your health care provider that can promote health and wellness. What does preventive care include?  A yearly physical exam. This is also called an annual well check.  Dental exams once or twice a year.  Routine eye exams. Ask your health care provider how often you should have your eyes checked.  Personal lifestyle choices, including:  Daily care of your teeth and gums.  Regular physical activity.  Eating a healthy diet.  Avoiding tobacco and drug use.  Limiting alcohol use.  Practicing safe sex.  Taking low-dose aspirin every day.  Taking vitamin and mineral supplements as recommended by your health care provider. What happens during an annual well check? The services and screenings done by your health care provider during your annual well check will depend  on your age, overall health, lifestyle risk factors, and family history of disease. Counseling  Your health care provider may ask you questions about your:  Alcohol use.  Tobacco use.  Drug use.  Emotional well-being.  Home and relationship well-being.  Sexual activity.  Eating habits.  History of falls.  Memory and ability to understand (cognition).  Work and work Statistician.  Reproductive health. Screening  You may have the following tests or measurements:  Height, weight, and BMI.  Blood pressure.  Lipid and cholesterol levels. These may be checked every 5 years, or more frequently if you are over 74 years old.  Skin check.  Lung cancer screening. You may have this screening every year starting at age 74 if you have a 30-pack-year history of smoking and currently smoke or have quit within the past 15 years.  Fecal occult blood test (FOBT) of the stool. You may have this test every year starting at age 74.  Flexible sigmoidoscopy or colonoscopy. You may have a sigmoidoscopy every 5 years or a colonoscopy every 10 years starting at age 74.  Hepatitis C blood test.  Hepatitis B blood test.  Sexually transmitted disease (STD) testing.  Diabetes screening. This is done by checking your blood sugar (glucose) after you have not eaten for a while (fasting). You may have this done every 1-3 years.  Bone density scan. This is done to screen for osteoporosis. You may have this done starting at age 74.  Mammogram. This may be done every 1-2 years. Talk to your health care provider about how often you should have regular mammograms. Talk with your health care provider about your test results, treatment options, and if necessary, the need  for more tests. Vaccines  Your health care provider may recommend certain vaccines, such as:  Influenza vaccine. This is recommended every year.  Tetanus, diphtheria, and acellular pertussis (Tdap, Td) vaccine. You may need a Td  booster every 10 years.  Zoster vaccine. You may need this after age 74.  Pneumococcal 13-valent conjugate (PCV13) vaccine. One dose is recommended after age 74.  Pneumococcal polysaccharide (PPSV23) vaccine. One dose is recommended after age 74. Talk to your health care provider about which screenings and vaccines you need and how often you need them. This information is not intended to replace advice given to you by your health care provider. Make sure you discuss any questions you have with your health care provider. Document Released: 06/17/2015 Document Revised: 02/08/2016 Document Reviewed: 03/22/2015 Elsevier Interactive Patient Education  2017 [place] Prevention in the Home Falls can cause injuries. They can happen to people of all ages. There are many things you can do to make your home safe and to help prevent falls. What can I do on the outside of my home?  Regularly fix the edges of walkways and driveways and fix any cracks.  Remove anything that might make you trip as you walk through a door, such as a raised step or threshold.  Trim any bushes or trees on the path to your home.  Use bright outdoor lighting.  Clear any walking paths of anything that might make someone trip, such as rocks or tools.  Regularly check to see if handrails are loose or broken. Make sure that both sides of any steps have handrails.  Any raised decks and porches should have guardrails on the edges.  Have any leaves, snow, or ice cleared regularly.  Use sand or salt on walking paths during winter.  Clean up any spills in your garage right away. This includes oil or grease spills. What can I do in the bathroom?  Use night lights.  Install grab bars by the toilet and in the tub and shower. Do not use towel bars as grab bars.  Use non-skid mats or decals in the tub or shower.  If you need to sit down in the shower, use a plastic, non-slip stool.  Keep the floor dry. Clean up  any water that spills on the floor as soon as it happens.  Remove soap buildup in the tub or shower regularly.  Attach bath mats securely with double-sided non-slip rug tape.  Do not have throw rugs and other things on the floor that can make you trip. What can I do in the bedroom?  Use night lights.  Make sure that you have a light by your bed that is easy to reach.  Do not use any sheets or blankets that are too big for your bed. They should not hang down onto the floor.  Have a firm chair that has side arms. You can use this for support while you get dressed.  Do not have throw rugs and other things on the floor that can make you trip. What can I do in the kitchen?  Clean up any spills right away.  Avoid walking on wet floors.  Keep items that you use a lot in easy-to-reach places.  If you need to reach something above you, use a strong step stool that has a grab bar.  Keep electrical cords out of the way.  Do not use floor polish or wax that makes floors slippery. If you must use wax, use  non-skid floor wax.  Do not have throw rugs and other things on the floor that can make you trip. What can I do with my stairs?  Do not leave any items on the stairs.  Make sure that there are handrails on both sides of the stairs and use them. Fix handrails that are broken or loose. Make sure that handrails are as long as the stairways.  Check any carpeting to make sure that it is firmly attached to the stairs. Fix any carpet that is loose or worn.  Avoid having throw rugs at the top or bottom of the stairs. If you do have throw rugs, attach them to the floor with carpet tape.  Make sure that you have a light switch at the top of the stairs and the bottom of the stairs. If you do not have them, ask someone to add them for you. What else can I do to help prevent falls?  Wear shoes that:  Do not have high heels.  Have rubber bottoms.  Are comfortable and fit you well.  Are  closed at the toe. Do not wear sandals.  If you use a stepladder:  Make sure that it is fully opened. Do not climb a closed stepladder.  Make sure that both sides of the stepladder are locked into place.  Ask someone to hold it for you, if possible.  Clearly mark and make sure that you can see:  Any grab bars or handrails.  First and last steps.  Where the edge of each step is.  Use tools that help you move around (mobility aids) if they are needed. These include:  Canes.  Walkers.  Scooters.  Crutches.  Turn on the lights when you go into a dark area. Replace any light bulbs as soon as they burn out.  Set up your furniture so you have a clear path. Avoid moving your furniture around.  If any of your floors are uneven, fix them.  If there are any pets around you, be aware of where they are.  Review your medicines with your doctor. Some medicines can make you feel dizzy. This can increase your chance of falling. Ask your doctor what other things that you can do to help prevent falls. This information is not intended to replace advice given to you by your health care provider. Make sure you discuss any questions you have with your health care provider. Document Released: 03/17/2009 Document Revised: 10/27/2015 Document Reviewed: 06/25/2014 Elsevier Interactive Patient Education  2017 [employer].

## 2017-01-18 ENCOUNTER — Encounter: Payer: Self-pay | Admitting: Internal Medicine

## 2017-01-18 ENCOUNTER — Ambulatory Visit (INDEPENDENT_AMBULATORY_CARE_PROVIDER_SITE_OTHER): Payer: PPO | Admitting: Internal Medicine

## 2017-01-18 VITALS — BP 114/68 | HR 89 | Ht 62.0 in | Wt 129.0 lb

## 2017-01-18 DIAGNOSIS — F411 Generalized anxiety disorder: Secondary | ICD-10-CM

## 2017-01-18 DIAGNOSIS — K2100 Gastro-esophageal reflux disease with esophagitis, without bleeding: Secondary | ICD-10-CM

## 2017-01-18 DIAGNOSIS — F324 Major depressive disorder, single episode, in partial remission: Secondary | ICD-10-CM

## 2017-01-18 DIAGNOSIS — L820 Inflamed seborrheic keratosis: Secondary | ICD-10-CM

## 2017-01-18 DIAGNOSIS — I69359 Hemiplegia and hemiparesis following cerebral infarction affecting unspecified side: Secondary | ICD-10-CM

## 2017-01-18 DIAGNOSIS — Z1239 Encounter for other screening for malignant neoplasm of breast: Secondary | ICD-10-CM

## 2017-01-18 DIAGNOSIS — Z1231 Encounter for screening mammogram for malignant neoplasm of breast: Secondary | ICD-10-CM

## 2017-01-18 HISTORY — DX: Inflamed seborrheic keratosis: L82.0

## 2017-01-18 MED ORDER — SERTRALINE HCL 100 MG PO TABS: 100.0000 mg | ORAL_TABLET | Freq: Every day | ORAL | 1 refills | Status: DC

## 2017-01-18 MED ORDER — ALPRAZOLAM 0.25 MG PO TABS: 0.2500 mg | ORAL_TABLET | Freq: Every evening | ORAL | 5 refills | Status: DC | PRN
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
## 2017-01-18 NOTE — Patient Instructions (Signed)
Consult Dermatology.

## 2017-01-23 ENCOUNTER — Other Ambulatory Visit: Payer: Self-pay | Admitting: Internal Medicine

## 2017-01-23 DIAGNOSIS — E785 Hyperlipidemia, unspecified: Secondary | ICD-10-CM

## 2017-02-22 ENCOUNTER — Other Ambulatory Visit: Payer: Self-pay | Admitting: Internal Medicine

## 2017-02-22 ENCOUNTER — Telehealth: Payer: Self-pay

## 2017-02-22 MED ORDER — IPRATROPIUM BROMIDE 0.06 % NA SOLN: 2.0000 | Freq: Two times a day (BID) | NASAL | 5 refills | Status: DC

## 2017-02-22 NOTE — Telephone Encounter (Signed)
Fax received requesting refill on: Ipratropium 0.06% Spray #45 for 90 days Send to [facility].

## 2017-02-26 ENCOUNTER — Other Ambulatory Visit: Payer: Self-pay | Admitting: Internal Medicine

## 2017-02-26 MED ORDER — IPRATROPIUM BROMIDE 0.06 % NA SOLN
2.0000 | Freq: Two times a day (BID) | NASAL | 3 refills | Status: DC
Start: 2017-02-26 — End: 2018-05-19

## 2017-03-08 ENCOUNTER — Other Ambulatory Visit: Payer: Self-pay | Admitting: Internal Medicine

## 2017-03-08 DIAGNOSIS — F324 Major depressive disorder, single episode, in partial remission: Secondary | ICD-10-CM

## 2017-04-04 ENCOUNTER — Other Ambulatory Visit: Payer: Self-pay | Admitting: Internal Medicine

## 2017-04-08 ENCOUNTER — Other Ambulatory Visit: Payer: Self-pay | Admitting: Internal Medicine

## 2017-04-29 ENCOUNTER — Other Ambulatory Visit: Payer: Self-pay | Admitting: Internal Medicine

## 2017-07-12 ENCOUNTER — Other Ambulatory Visit: Payer: Self-pay | Admitting: Internal Medicine

## 2017-07-12 DIAGNOSIS — F324 Major depressive disorder, single episode, in partial remission: Secondary | ICD-10-CM

## 2017-07-16 ENCOUNTER — Encounter: Payer: Self-pay | Admitting: Internal Medicine

## 2017-07-16 ENCOUNTER — Ambulatory Visit (INDEPENDENT_AMBULATORY_CARE_PROVIDER_SITE_OTHER): Payer: PPO | Admitting: Internal Medicine

## 2017-07-16 VITALS — BP 122/84 | HR 85 | Ht 62.0 in | Wt 128.6 lb

## 2017-07-16 DIAGNOSIS — Z0000 Encounter for general adult medical examination without abnormal findings: Secondary | ICD-10-CM

## 2017-07-16 DIAGNOSIS — Z8781 Personal history of (healed) traumatic fracture: Secondary | ICD-10-CM

## 2017-07-16 DIAGNOSIS — G8929 Other chronic pain: Secondary | ICD-10-CM

## 2017-07-16 DIAGNOSIS — M79674 Pain in right toe(s): Secondary | ICD-10-CM | POA: Diagnosis not present

## 2017-07-16 DIAGNOSIS — F324 Major depressive disorder, single episode, in partial remission: Secondary | ICD-10-CM

## 2017-07-16 DIAGNOSIS — Z0001 Encounter for general adult medical examination with abnormal findings: Secondary | ICD-10-CM

## 2017-07-16 DIAGNOSIS — E785 Hyperlipidemia, unspecified: Secondary | ICD-10-CM

## 2017-07-16 DIAGNOSIS — F411 Generalized anxiety disorder: Secondary | ICD-10-CM | POA: Diagnosis not present

## 2017-07-16 DIAGNOSIS — I69359 Hemiplegia and hemiparesis following cerebral infarction affecting unspecified side: Secondary | ICD-10-CM | POA: Diagnosis not present

## 2017-07-16 MED ORDER — TRAZODONE HCL 50 MG PO TABS: 50.0000 mg | ORAL_TABLET | Freq: Every day | ORAL | 1 refills | Status: DC

## 2017-07-16 MED ORDER — SIMVASTATIN 40 MG PO TABS: 40.0000 mg | ORAL_TABLET | Freq: Every day | ORAL | 1 refills | Status: DC

## 2017-07-16 MED ORDER — ALPRAZOLAM 0.25 MG PO TABS: 0.2500 mg | ORAL_TABLET | Freq: Every evening | ORAL | 5 refills | Status: DC | PRN

## 2017-07-16 MED ORDER — CLOPIDOGREL BISULFATE 75 MG PO TABS: 75.0000 mg | ORAL_TABLET | Freq: Every day | ORAL | 1 refills | Status: DC

## 2017-07-16 NOTE — Patient Instructions (Signed)
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
## 2017-07-17 LAB — COMPREHENSIVE METABOLIC PANEL
A/G RATIO: 1.5 (ref 1.2–2.2)
ALK PHOS: 69 IU/L (ref 39–117)
ALT: 17 IU/L (ref 0–32)
AST: 26 IU/L (ref 0–40)
Albumin: 4.3 g/dL (ref 3.5–4.8)
BILIRUBIN TOTAL: 0.3 mg/dL (ref 0.0–1.2)
BUN / CREAT RATIO: 13 (ref 12–28)
BUN: 7 mg/dL — ABNORMAL LOW (ref 8–27)
CHLORIDE: 101 mmol/L (ref 96–106)
CO2: 24 mmol/L (ref 20–29)
Calcium: 9.6 mg/dL (ref 8.7–10.3)
Creatinine, Ser: 0.56 mg/dL — ABNORMAL LOW (ref 0.57–1.00)
GFR calc Af Amer: 106 mL/min/{1.73_m2} (ref >59)
GFR calc non Af Amer: 92 mL/min/{1.73_m2} (ref >59)
GLUCOSE: 88 mg/dL (ref 65–99)
Globulin, Total: 2.9 g/dL (ref 1.5–4.5)
POTASSIUM: 4.7 mmol/L (ref 3.5–5.2)
Sodium: 143 mmol/L (ref 134–144)
TOTAL PROTEIN: 7.2 g/dL (ref 6.0–8.5)

## 2017-07-17 LAB — LIPID PANEL
CHOLESTEROL TOTAL: 206 mg/dL — AB (ref 100–199)
Chol/HDL Ratio: 1.9 ratio (ref 0.0–4.4)
HDL: 106 mg/dL (ref >39)
LDL Calculated: 63 mg/dL (ref 0–99)
Triglycerides: 187 mg/dL — ABNORMAL HIGH (ref 0–149)
VLDL Cholesterol Cal: 37 mg/dL (ref 5–40)

## 2017-07-17 LAB — CBC WITH DIFFERENTIAL/PLATELET
BASOS ABS: 0 10*3/uL (ref 0.0–0.2)
BASOS: 1 %
EOS (ABSOLUTE): 0.2 10*3/uL (ref 0.0–0.4)
Eos: 3 %
Hematocrit: 41.9 % (ref 34.0–46.6)
Hemoglobin: 13.3 g/dL (ref 11.1–15.9)
IMMATURE GRANS (ABS): 0 10*3/uL (ref 0.0–0.1)
Immature Granulocytes: 0 %
LYMPHS ABS: 1.8 10*3/uL (ref 0.7–3.1)
Lymphs: 34 %
MCH: 34.3 pg — AB (ref 26.6–33.0)
MCHC: 31.7 g/dL (ref 31.5–35.7)
MCV: 108 fL — AB (ref 79–97)
MONOS ABS: 0.4 10*3/uL (ref 0.1–0.9)
Monocytes: 7 %
Neutrophils Absolute: 3 10*3/uL (ref 1.4–7.0)
Neutrophils: 55 %
PLATELETS: 252 10*3/uL (ref 150–379)
RBC: 3.88 x10E6/uL (ref 3.77–5.28)
RDW: 13.2 % (ref 12.3–15.4)
WBC: 5.4 10*3/uL (ref 3.4–10.8)

## 2017-07-17 LAB — TSH: TSH: 1.26 u[IU]/mL (ref 0.450–4.500)

## 2017-08-16 ENCOUNTER — Other Ambulatory Visit: Payer: Self-pay

## 2017-08-16 MED ORDER — PANTOPRAZOLE SODIUM 40 MG PO TBEC: 40.0000 mg | DELAYED_RELEASE_TABLET | Freq: Two times a day (BID) | ORAL | 5 refills | Status: DC

## 2017-08-18 ENCOUNTER — Other Ambulatory Visit: Payer: Self-pay | Admitting: Internal Medicine

## 2017-08-18 DIAGNOSIS — F324 Major depressive disorder, single episode, in partial remission: Secondary | ICD-10-CM

## 2017-08-28 ENCOUNTER — Other Ambulatory Visit: Payer: Self-pay | Admitting: Internal Medicine

## 2017-08-28 ENCOUNTER — Ambulatory Visit (INDEPENDENT_AMBULATORY_CARE_PROVIDER_SITE_OTHER): Payer: PPO | Admitting: Internal Medicine

## 2017-08-28 ENCOUNTER — Encounter: Payer: Self-pay | Admitting: Internal Medicine

## 2017-08-28 VITALS — BP 152/90 | HR 92 | Temp 97.6°F | Ht 62.0 in | Wt 128.0 lb

## 2017-08-28 DIAGNOSIS — N3000 Acute cystitis without hematuria: Secondary | ICD-10-CM

## 2017-08-28 LAB — POC URINALYSIS WITH MICROSCOPIC (NON AUTO)MANUAL RESULT
BILIRUBIN UA: NEGATIVE
Crystals: 0
Epithelial cells, urine per micros: 0
Glucose, UA: NEGATIVE
KETONES UA: NEGATIVE
MUCUS UA: 0
NITRITE UA: NEGATIVE
PH UA: 5 (ref 5.0–8.0)
Protein, UA: 300
RBC: 0 M/uL — AB (ref 4.04–5.48)
SPEC GRAV UA: 1.015 (ref 1.010–1.025)
Urobilinogen, UA: 0.2 E.U./dL
WBC CASTS UA: 15

## 2017-08-28 MED ORDER — CEFUROXIME AXETIL 250 MG PO TABS: 250.0000 mg | ORAL_TABLET | Freq: Two times a day (BID) | ORAL | 0 refills | Status: AC
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
## 2017-08-31 LAB — URINE CULTURE

## 2017-08-31 LAB — SPECIMEN STATUS REPORT

## 2017-09-02 NOTE — Progress Notes (Signed)
Patient just called stating Ceftin is too strong for her. She is having stomach pains and diarrhea. Wants to know if there is anything she can take?

## 2017-09-02 NOTE — Progress Notes (Signed)
Patient stated she took for 5 days but did not take but one dose the last 3 days. No more symptoms for a bladder infection. Informed her to stop antibiotics and if symptoms return give [place] a call.

## 2017-09-02 NOTE — Progress Notes (Signed)
How many days of antibiotics has she taken?

## 2017-09-06 ENCOUNTER — Other Ambulatory Visit: Payer: Self-pay

## 2017-09-06 MED ORDER — NITROFURANTOIN MONOHYD MACRO 100 MG PO CAPS: 100.0000 mg | ORAL_CAPSULE | Freq: Two times a day (BID) | ORAL | 0 refills | Status: AC

## 2017-10-03 ENCOUNTER — Other Ambulatory Visit: Payer: Self-pay | Admitting: Internal Medicine

## 2017-10-03 DIAGNOSIS — F324 Major depressive disorder, single episode, in partial remission: Secondary | ICD-10-CM

## 2017-12-10 ENCOUNTER — Telehealth: Payer: Self-pay | Admitting: Internal Medicine

## 2017-12-10 NOTE — Telephone Encounter (Signed)
Chassidy, this pt wanted to know whether she needed to fast for her appt with Dr. Army Melia on 8/13 could you please call her to let her know. She can be reached at her home # [phone]  Thank you, Jill Alexanders

## 2017-12-12 NOTE — Telephone Encounter (Signed)
She does not need to fast for her next appt.

## 2018-01-02 ENCOUNTER — Other Ambulatory Visit: Payer: Self-pay | Admitting: Internal Medicine

## 2018-01-02 DIAGNOSIS — F324 Major depressive disorder, single episode, in partial remission: Secondary | ICD-10-CM

## 2018-01-09 ENCOUNTER — Other Ambulatory Visit: Payer: Self-pay | Admitting: Internal Medicine

## 2018-01-09 DIAGNOSIS — E785 Hyperlipidemia, unspecified: Secondary | ICD-10-CM

## 2018-01-14 ENCOUNTER — Other Ambulatory Visit
Admission: RE | Admit: 2018-01-14 | Discharge: 2018-01-14 | Disposition: A | Discharge status: Home / Self Care | Payer: PPO | Source: Ambulatory Visit | Attending: Internal Medicine | Admitting: Internal Medicine

## 2018-01-14 ENCOUNTER — Ambulatory Visit (INDEPENDENT_AMBULATORY_CARE_PROVIDER_SITE_OTHER): Payer: PPO | Admitting: Internal Medicine

## 2018-01-14 ENCOUNTER — Encounter: Payer: Self-pay | Admitting: Internal Medicine

## 2018-01-14 VITALS — BP 112/78 | HR 68 | Resp 16 | Ht 62.0 in | Wt 130.0 lb

## 2018-01-14 DIAGNOSIS — E785 Hyperlipidemia, unspecified: Secondary | ICD-10-CM | POA: Insufficient documentation

## 2018-01-14 DIAGNOSIS — K2100 Gastro-esophageal reflux disease with esophagitis, without bleeding: Secondary | ICD-10-CM

## 2018-01-14 DIAGNOSIS — F324 Major depressive disorder, single episode, in partial remission: Secondary | ICD-10-CM

## 2018-01-14 DIAGNOSIS — F411 Generalized anxiety disorder: Secondary | ICD-10-CM

## 2018-01-14 LAB — COMPREHENSIVE METABOLIC PANEL
ALBUMIN: 4.2 g/dL (ref 3.5–5.0)
ALT: 17 U/L (ref 0–44)
ANION GAP: 13 (ref 5–15)
AST: 28 U/L (ref 15–41)
Alkaline Phosphatase: 61 U/L (ref 38–126)
BUN: 10 mg/dL (ref 8–23)
CHLORIDE: 100 mmol/L (ref 98–111)
CO2: 27 mmol/L (ref 22–32)
Calcium: 9.9 mg/dL (ref 8.9–10.3)
Creatinine, Ser: 0.45 mg/dL (ref 0.44–1.00)
GFR calc Af Amer: >60 mL/min (ref >60)
GFR calc non Af Amer: >60 mL/min (ref >60)
GLUCOSE: 103 mg/dL — AB (ref 70–99)
POTASSIUM: 5.2 mmol/L — AB (ref 3.5–5.1)
SODIUM: 140 mmol/L (ref 135–145)
Total Bilirubin: 0.5 mg/dL (ref 0.3–1.2)
Total Protein: 8 g/dL (ref 6.5–8.1)

## 2018-01-14 LAB — LIPID PANEL
CHOL/HDL RATIO: 2.2 ratio
Cholesterol: 244 mg/dL — ABNORMAL HIGH (ref 0–200)
HDL: 113 mg/dL (ref >40)
LDL Cholesterol: 78 mg/dL (ref 0–99)
Triglycerides: 264 mg/dL — ABNORMAL HIGH (ref <150)
VLDL: 53 mg/dL — AB (ref 0–40)

## 2018-01-14 MED ORDER — ALPRAZOLAM 0.25 MG PO TABS: 0.2500 mg | ORAL_TABLET | Freq: Every evening | ORAL | 5 refills | Status: DC | PRN
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
## 2018-01-15 ENCOUNTER — Ambulatory Visit: Payer: Self-pay

## 2018-01-20 ENCOUNTER — Ambulatory Visit (INDEPENDENT_AMBULATORY_CARE_PROVIDER_SITE_OTHER): Payer: PPO

## 2018-01-20 VITALS — BP 150/72 | HR 88 | Temp 98.2°F | Resp 12 | Ht 62.0 in | Wt 130.6 lb

## 2018-01-20 DIAGNOSIS — Z0000 Encounter for general adult medical examination without abnormal findings: Secondary | ICD-10-CM | POA: Diagnosis not present

## 2018-01-20 NOTE — Patient Instructions (Addendum)
Valerie Hanna , Thank you for taking time to come for your Medicare Wellness Visit. I appreciate your ongoing commitment to your health goals. Please review the following plan we discussed and let me know if I can assist you in the future.   Screening recommendations/referrals: Colorectal Screening: No longer required Mammogram: No longer required Bone Density: No longer required  Vision and Dental Exams: Recommended annual ophthalmology exams for early detection of glaucoma and other disorders of the eye Recommended annual dental exams for proper oral hygiene  Vaccinations: Influenza vaccine: Overdue Pneumococcal vaccine: Declined Tdap vaccine: Up to date Shingles vaccine: Please call your insurance company to determine your out of pocket expense for the Shingrix vaccine. You may also receive this vaccine at your local pharmacy or Health Dept.    Advanced directives: Please bring a copy of your POA (Power of Attorney) and/or Living Will to your next appointment.  Goals: Recommend to remove any items from the home that may cause slips or trips.  Next appointment: Please schedule your Annual Wellness Visit with your Nurse Health Advisor in one year.  Preventive Care 75 Years and Older, Female Preventive care refers to lifestyle choices and visits with your health care provider that can promote health and wellness. What does preventive care include?  A yearly physical exam. This is also called an annual well check.  Dental exams once or twice a year.  Routine eye exams. Ask your health care provider how often you should have your eyes checked.  Personal lifestyle choices, including:  Daily care of your teeth and gums.  Regular physical activity.  Eating a healthy diet.  Avoiding tobacco and drug use.  Limiting alcohol use.  Practicing safe sex.  Taking low-dose aspirin every day.  Taking vitamin and mineral supplements as recommended by your health care provider. What  happens during an annual well check? The services and screenings done by your health care provider during your annual well check will depend on your age, overall health, lifestyle risk factors, and family history of disease. Counseling  Your health care provider may ask you questions about your:  Alcohol use.  Tobacco use.  Drug use.  Emotional well-being.  Home and relationship well-being.  Sexual activity.  Eating habits.  History of falls.  Memory and ability to understand (cognition).  Work and work Statistician.  Reproductive health. Screening  You may have the following tests or measurements:  Height, weight, and BMI.  Blood pressure.  Lipid and cholesterol levels. These may be checked every 5 years, or more frequently if you are over 19 years old.  Skin check.  Lung cancer screening. You may have this screening every year starting at age 41 if you have a 30-pack-year history of smoking and currently smoke or have quit within the past 15 years.  Fecal occult blood test (FOBT) of the stool. You may have this test every year starting at age 58.  Flexible sigmoidoscopy or colonoscopy. You may have a sigmoidoscopy every 5 years or a colonoscopy every 10 years starting at age 52.  Hepatitis C blood test.  Hepatitis B blood test.  Sexually transmitted disease (STD) testing.  Diabetes screening. This is done by checking your blood sugar (glucose) after you have not eaten for a while (fasting). You may have this done every 1-3 years.  Bone density scan. This is done to screen for osteoporosis. You may have this done starting at age 25.  Mammogram. This may be done every 1-2 years. Talk  to your health care provider about how often you should have regular mammograms. Talk with your health care provider about your test results, treatment options, and if necessary, the need for more tests. Vaccines  Your health care provider may recommend certain vaccines, such  as:  Influenza vaccine. This is recommended every year.  Tetanus, diphtheria, and acellular pertussis (Tdap, Td) vaccine. You may need a Td booster every 10 years.  Zoster vaccine. You may need this after age 51.  Pneumococcal 13-valent conjugate (PCV13) vaccine. One dose is recommended after age 86.  Pneumococcal polysaccharide (PPSV23) vaccine. One dose is recommended after age 54. Talk to your health care provider about which screenings and vaccines you need and how often you need them. This information is not intended to replace advice given to you by your health care provider. Make sure you discuss any questions you have with your health care provider. Document Released: 06/17/2015 Document Revised: 02/08/2016 Document Reviewed: 03/22/2015 Elsevier Interactive Patient Education  2017 [place] Prevention in the Home Falls can cause injuries. They can happen to people of all ages. There are many things you can do to make your home safe and to help prevent falls. What can I do on the outside of my home?  Regularly fix the edges of walkways and driveways and fix any cracks.  Remove anything that might make you trip as you walk through a door, such as a raised step or threshold.  Trim any bushes or trees on the path to your home.  Use bright outdoor lighting.  Clear any walking paths of anything that might make someone trip, such as rocks or tools.  Regularly check to see if handrails are loose or broken. Make sure that both sides of any steps have handrails.  Any raised decks and porches should have guardrails on the edges.  Have any leaves, snow, or ice cleared regularly.  Use sand or salt on walking paths during winter.  Clean up any spills in your garage right away. This includes oil or grease spills. What can I do in the bathroom?  Use night lights.  Install grab bars by the toilet and in the tub and shower. Do not use towel bars as grab bars.  Use  non-skid mats or decals in the tub or shower.  If you need to sit down in the shower, use a plastic, non-slip stool.  Keep the floor dry. Clean up any water that spills on the floor as soon as it happens.  Remove soap buildup in the tub or shower regularly.  Attach bath mats securely with double-sided non-slip rug tape.  Do not have throw rugs and other things on the floor that can make you trip. What can I do in the bedroom?  Use night lights.  Make sure that you have a light by your bed that is easy to reach.  Do not use any sheets or blankets that are too big for your bed. They should not hang down onto the floor.  Have a firm chair that has side arms. You can use this for support while you get dressed.  Do not have throw rugs and other things on the floor that can make you trip. What can I do in the kitchen?  Clean up any spills right away.  Avoid walking on wet floors.  Keep items that you use a lot in easy-to-reach places.  If you need to reach something above you, use a strong step stool that  has a grab bar.  Keep electrical cords out of the way.  Do not use floor polish or wax that makes floors slippery. If you must use wax, use non-skid floor wax.  Do not have throw rugs and other things on the floor that can make you trip. What can I do with my stairs?  Do not leave any items on the stairs.  Make sure that there are handrails on both sides of the stairs and use them. Fix handrails that are broken or loose. Make sure that handrails are as long as the stairways.  Check any carpeting to make sure that it is firmly attached to the stairs. Fix any carpet that is loose or worn.  Avoid having throw rugs at the top or bottom of the stairs. If you do have throw rugs, attach them to the floor with carpet tape.  Make sure that you have a light switch at the top of the stairs and the bottom of the stairs. If you do not have them, ask someone to add them for you. What  else can I do to help prevent falls?  Wear shoes that:  Do not have high heels.  Have rubber bottoms.  Are comfortable and fit you well.  Are closed at the toe. Do not wear sandals.  If you use a stepladder:  Make sure that it is fully opened. Do not climb a closed stepladder.  Make sure that both sides of the stepladder are locked into place.  Ask someone to hold it for you, if possible.  Clearly mark and make sure that you can see:  Any grab bars or handrails.  First and last steps.  Where the edge of each step is.  Use tools that help you move around (mobility aids) if they are needed. These include:  Canes.  Walkers.  Scooters.  Crutches.  Turn on the lights when you go into a dark area. Replace any light bulbs as soon as they burn out.  Set up your furniture so you have a clear path. Avoid moving your furniture around.  If any of your floors are uneven, fix them.  If there are any pets around you, be aware of where they are.  Review your medicines with your doctor. Some medicines can make you feel dizzy. This can increase your chance of falling. Ask your doctor what other things that you can do to help prevent falls. This information is not intended to replace advice given to you by your health care provider. Make sure you discuss any questions you have with your health care provider. Document Released: 03/17/2009 Document Revised: 10/27/2015 Document Reviewed: 06/25/2014 Elsevier Interactive Patient Education  2017 [employer].

## 2018-01-20 NOTE — Progress Notes (Signed)
Subjective:   Valerie Hanna is a 77 y.o. female who presents for Medicare Annual (Subsequent) preventive examination.  Review of Systems:  N/A Cardiac Risk Factors include: advanced age (>28men, >57 women);dyslipidemia;hypertension;sedentary lifestyle     Objective:     Vitals: BP (!) 150/72 (BP Location: Right Arm, Patient Position: Sitting, Cuff Size: Normal)   Pulse 88   Temp 98.2 F (36.8 C) (Oral)   Resp 12   Ht 5\' 2"  (1.575 m)   Wt 130 lb 9.6 oz (59.2 kg)   SpO2 94%   BMI 23.89 kg/m   Body mass index is 23.89 kg/m.  Above B/P discussed with Dr. Army Melia. No new orders or changes were made to pt's current regimen.  Advanced Directives 01/20/2018 01/14/2017 04/18/2016 04/16/2016 07/20/2015 05/26/2015 02/10/2015  Does Patient Have a Medical Advance Directive? Yes Yes No No Yes Yes No  Type of Paramedic of [place];Living will [place];Living will - - Living will Living will -  Copy of [place] in Chart? No - copy requested No - copy requested - - - - -  Would patient like information on creating a medical advance directive? - - No - patient declined information - - - -    Tobacco Social History   Tobacco Use  Smoking Status Former Smoker  . Packs/day: 0.50  . Years: 30.00  . Pack years: 15.00  . Types: Cigarettes  . Last attempt to quit: 04/15/2016  . Years since quitting: 1.7  Smokeless Tobacco Never Used  Tobacco Comment   smoking cessation materials not required     Counseling given: No Comment: smoking cessation materials not required  Clinical Intake:  Pre-visit preparation completed: Yes  Pain : No/denies pain   BMI - recorded: 23.89 Nutritional Status: BMI of 19-24  Normal Nutritional Risks: None Diabetes: No  How often do you need to have someone help you when you read instructions, pamphlets, or other written materials from your doctor or pharmacy?: 1 - Never  Interpreter  Needed?: No  Information entered by :: AEversole, LPN  Past Medical History:  Diagnosis Date  . Allergy   . Anemia   . Anxiety   . Arthritis   . Cancer ([place])    melanoma  ON HER BACK MANY YRS AGO  . Constipation   . Depression   . GERD (gastroesophageal reflux disease)   . H/O hiatal hernia   . Headache(784.0)   . High cholesterol   . History of kidney infection   . Hypertension    no longer taking meds since stroke  . Osteoporosis   . Pleurisy    being treated for this now 01/25/14 (left side)  . Stroke ([place])    2014, WITH LEFT SIDED WEAKNESS   Past Surgical History:  Procedure Laterality Date  . ABDOMINAL HYSTERECTOMY    . APPENDECTOMY    . BACK SURGERY     kyphoplasty  . bladder tack    . CHOLECYSTECTOMY    . COLONOSCOPY    . DILATION AND CURETTAGE OF UTERUS    . EYE SURGERY Right    cataract removed  . EYE SURGERY Right    "bubble" put in for a hole in retina  . FOOT SURGERY Bilateral   . KYPHOPLASTY N/A 01/28/2014   Procedure: LUMBAR FOUR KYPHOPLASTY;  Surgeon: Ashok Pall, MD;  Location: [facility];  Service: Neurosurgery;  Laterality: N/A;  L4 Kyphoplasty  . KYPHOPLASTY N/A 04/18/2016  Procedure: KYPHOPLASTY Thoracic eight-Thoracic ten;  Surgeon: Ashok Pall, MD;  Location: [facility];  Service: Neurosurgery;  Laterality: N/A;  KYPHOPLASTY T8-T10  . TOTAL HIP ARTHROPLASTY Right 05/26/2015   Procedure: RIGHT BIPOLAR VS TOTAL HIP ANTERIOR APPROACH;  Surgeon: Mcarthur Rossetti, MD;  Location: [facility];  Service: Orthopedics;  Laterality: Right;   Family History  Problem Relation Age of Onset  . Healthy Sister   . Healthy Sister    Social History   Socioeconomic History  . Marital status: Married    Spouse name: Not on file  . Number of children: 1  . Years of education: Not on file  . Highest education level: 11th grade  Occupational History  . Occupation: Retired  Scientific laboratory technician  . Financial resource strain: Not hard at all  . Food insecurity:     Worry: Never true    Inability: Never true  . Transportation needs:    Medical: No    Non-medical: No  Tobacco Use  . Smoking status: Former Smoker    Packs/day: 0.50    Years: 30.00    Pack years: 15.00    Types: Cigarettes    Last attempt to quit: 04/15/2016    Years since quitting: 1.7  . Smokeless tobacco: Never Used  . Tobacco comment: smoking cessation materials not required  Substance and Sexual Activity  . Alcohol use: Yes    Alcohol/week: 14.0 - 21.0 standard drinks    Types: 14 - 21 Glasses of wine per week  . Drug use: No  . Sexual activity: Not Currently  Lifestyle  . Physical activity:    Days per week: 0 days    Minutes per session: 0 min  . Stress: Not at all  Relationships  . Social connections:    Talks on phone: Patient refused    Gets together: Patient refused    Attends religious service: Patient refused    Active member of club or organization: Patient refused    Attends meetings of clubs or organizations: Patient refused    Relationship status: Married  Other Topics Concern  . Not on file  Social History Narrative  . Not on file    Outpatient Encounter Medications as of 01/20/2018  Medication Sig  . [START ON 01/22/2018] ALPRAZolam (XANAX) 0.25 MG tablet Take 1 tablet (0.25 mg total) by mouth at bedtime as needed for anxiety.  Marland Kitchen CALCIUM PO Take 1 tablet by mouth daily.  . clopidogrel (PLAVIX) 75 MG tablet Take 1 tablet (75 mg total) by mouth daily.  . Cyanocobalamin (VITAMIN B-12 PO) Take 1 tablet by mouth daily.  . fexofenadine (ALLEGRA) 60 MG tablet Take 60 mg by mouth daily.  Marland Kitchen ipratropium (ATROVENT) 0.06 % nasal spray Place 2 sprays into both nostrils 2 (two) times daily.  . mirtazapine (REMERON) 30 MG tablet TAKE 1 TABLET BY MOUTH AT BEDTIME  . Multiple Vitamin (MULTIVITAMIN WITH MINERALS) TABS tablet Take 1 tablet by mouth daily.  . pantoprazole (PROTONIX) 40 MG tablet Take 1 tablet (40 mg total) by mouth 2 (two) times daily.  . sertraline  (ZOLOFT) 100 MG tablet TAKE 1 TABLET (100 MG TOTAL) BY MOUTH AT BEDTIME.  . simvastatin (ZOCOR) 40 MG tablet TAKE 1 TABLET (40 MG TOTAL) BY MOUTH DAILY AT 6 PM.  . traZODone (DESYREL) 50 MG tablet Take 1 tablet (50 mg total) by mouth at bedtime.   No facility-administered encounter medications on file as of 01/20/2018.     Activities of Daily Living In  your present state of health, do you have any difficulty performing the following activities: 01/20/2018 01/14/2018  Hearing? N N  Comment denies hearing aids -  Vision? N N  Comment wears eyeglasses -  Difficulty concentrating or making decisions? Y N  Comment aphasic -  Walking or climbing stairs? Y Y  Comment avoids satirs -  Dressing or bathing? N N  Doing errands, shopping? N Y  Conservation officer, nature and eating ? N -  Comment denies dentures -  Using the Toilet? N -  In the past six months, have you accidently leaked urine? N -  Do you have problems with loss of bowel control? N -  Managing your Medications? N -  Managing your Finances? N -  Housekeeping or managing your Housekeeping? N -  Some recent data might be hidden    Patient Care Team: Glean Hess, MD as PCP - General (Internal Medicine) Garrel Ridgel, DPM as Consulting Physician (Podiatry)    Assessment:   This is a routine wellness examination for [place].  Exercise Activities and Dietary recommendations Current Exercise Habits: The patient does not participate in regular exercise at present, Exercise limited by: None identified  Goals    . Increase water intake     Recommend drinking at least 6-8 glasses of water a day    . Prevent falls     Recommend to remove any items from the home that may cause slips or trips.       Fall Risk Fall Risk  01/20/2018 01/14/2018 01/14/2017 07/20/2015 12/01/2014  Falls in the past year? No No No Yes No  Number falls in past yr: - - - 2 or more -  Injury with Fall? - - - Yes -  Risk Factor Category  - - - High Fall Risk -    Risk for fall due to : Impaired vision;History of fall(s);Impaired balance/gait - - - -  Risk for fall due to: Comment wears eyeglasses; ambulates with cane or rolling walker - - - -  Follow up - - - Falls evaluation completed -   FALL RISK PREVENTION PERTAINING TO HOME: Is your home free of loose throw rugs in walkways, pet beds, electrical cords, etc? Yes Is there adequate lighting in your home to reduce risk of falls?  Yes Are there stairs in or around your home WITH handrails? No stairs  ASSISTIVE DEVICES UTILIZED TO PREVENT FALLS: Use of a cane, walker or w/c? Yes, cane and rolling walking Grab bars in the bathroom? No  Shower chair or a place to sit while bathing? Yes An elevated toilet seat or a handicapped toilet? Yes  Timed Get Up and Go Performed: Yes. Pt ambulated 10 feet within 32 sec. Gait slow, steady and with the use of an assistive device. No intervention required at this time. Fall risk prevention has been discussed.  Community Resource Referral:  Pt declined my offer to send [employer] Referral to Care Guide for installation of grab bars in the shower.  Depression Screen PHQ 2/9 Scores 01/20/2018 01/14/2018 01/14/2018 07/16/2017  PHQ - 2 Score 0 0 0 1  PHQ- 9 Score 0 - 0 7     Cognitive Function     6CIT Screen 01/20/2018 01/14/2017  What Year? 0 points 0 points  What month? 0 points 0 points  What time? 0 points 0 points  Count back from 20 0 points 0 points  Months in reverse 0 points 0 points  Repeat phrase 4  points 4 points  Total Score 4 4     There is no immunization history on file for this patient.  Qualifies for Shingles Vaccine? Yes. Due for Shingrix. Education has been provided regarding the importance of this vaccine. Pt has been advised to call insurance company to determine out of pocket expense. Advised may also receive vaccine at local pharmacy or Health Dept. Verbalized acceptance and understanding.  Overdue for Flu vaccine.  Education has been provided regarding the importance of this vaccine and advised to receive when available. Verbalized acceptance and understanding.  Due for Pneumoccocal vaccine. Declined my offer to administer today. Education has been provided regarding the importance of this vaccine but still declined. Advised may receive this vaccine at local pharmacy or Health Dept. Aware to provide a copy of the vaccination record if obtained from local pharmacy or Health Dept. Verbalized acceptance and understanding.  Screening Tests Health Maintenance  Topic Date Due  . INFLUENZA VACCINE  06/05/2023 (Originally 01/02/2018)  . COLONOSCOPY  06/20/2021  . TETANUS/TDAP  11/30/2024  . PNA vac Low Risk Adult  Discontinued    Cancer Screenings: Lung: Low Dose CT Chest recommended if Age 8-80 years, 30 pack-year currently smoking OR have quit w/in 15years. Patient does not qualify. Breast Screening: No longer required  Bone Density/Dexa: No longer required Colorectal: No longer required  Additional Screenings: Hepatitis C Screening: Does not qualify    Plan:  I have personally reviewed and addressed the Medicare Annual Wellness questionnaire and have noted the following in the patient's chart:  A. Medical and social history B. Use of alcohol, tobacco or illicit drugs  C. Current medications and supplements D. Functional ability and status E.  Nutritional status F.  Physical activity G. Advance directives H. List of other physicians I.  Hospitalizations, surgeries, and ER visits in previous 12 months J.  [place] such as hearing and vision if needed, cognitive and depression L. Referrals and appointments  In addition, I have reviewed and discussed with patient certain preventive protocols, quality metrics, and best practice recommendations. A written personalized care plan for preventive services as well as general preventive health recommendations were provided to patient.  Signed,   Aleatha Borer, LPN Nurse Health Advisor  MD Recommendations: Due for Shingrix. Education has been provided regarding the importance of this vaccine. Pt has been advised to call insurance company to determine out of pocket expense. Advised may also receive vaccine at local pharmacy or Health Dept. Verbalized acceptance and understanding.  Overdue for Flu vaccine. Education has been provided regarding the importance of this vaccine and advised to receive when available. Verbalized acceptance and understanding.  Due for Pneumoccocal vaccine. Declined my offer to administer today. Education has been provided regarding the importance of this vaccine but still declined. Advised may receive this vaccine at local pharmacy or Health Dept. Aware to provide a copy of the vaccination record if obtained from local pharmacy or Health Dept. Verbalized acceptance and understanding.

## 2018-01-27 ENCOUNTER — Ambulatory Visit
Admission: RE | Admit: 2018-01-27 | Discharge: 2018-01-27 | Disposition: A | Discharge status: Home / Self Care | Payer: PPO | Source: Ambulatory Visit | Attending: Internal Medicine | Admitting: Internal Medicine

## 2018-01-27 ENCOUNTER — Ambulatory Visit (INDEPENDENT_AMBULATORY_CARE_PROVIDER_SITE_OTHER): Payer: PPO | Admitting: Internal Medicine

## 2018-01-27 ENCOUNTER — Encounter: Payer: Self-pay | Admitting: Internal Medicine

## 2018-01-27 VITALS — BP 136/86 | HR 89 | Temp 97.8°F | Ht 62.0 in | Wt 130.0 lb

## 2018-01-27 DIAGNOSIS — R079 Chest pain, unspecified: Secondary | ICD-10-CM | POA: Diagnosis not present

## 2018-01-27 DIAGNOSIS — J40 Bronchitis, not specified as acute or chronic: Secondary | ICD-10-CM

## 2018-01-27 DIAGNOSIS — R0602 Shortness of breath: Secondary | ICD-10-CM

## 2018-01-27 DIAGNOSIS — I700 Atherosclerosis of aorta: Secondary | ICD-10-CM | POA: Diagnosis not present

## 2018-01-27 DIAGNOSIS — R937 Abnormal findings on diagnostic imaging of other parts of musculoskeletal system: Secondary | ICD-10-CM | POA: Insufficient documentation

## 2018-01-27 MED ORDER — AZITHROMYCIN 250 MG PO TABS: ORAL_TABLET | ORAL | 0 refills | Status: AC
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
## 2018-01-29 ENCOUNTER — Other Ambulatory Visit: Payer: Self-pay | Admitting: Internal Medicine

## 2018-01-29 ENCOUNTER — Telehealth: Payer: Self-pay

## 2018-01-29 MED ORDER — ALBUTEROL SULFATE HFA 108 (90 BASE) MCG/ACT IN AERS
2.0000 | INHALATION_SPRAY | Freq: Four times a day (QID) | RESPIRATORY_TRACT | 0 refills | Status: DC | PRN
Start: 2018-01-29 — End: 2020-03-08

## 2018-01-29 NOTE — Telephone Encounter (Signed)
Patient calling stating she was just seen for SOB but wanted to know if Dr Army Melia would call in inhaler to help with her breathing.  Please Advise.   Wants sent to [facility] [place].

## 2018-01-29 NOTE — Telephone Encounter (Signed)
Sent in to [facility].

## 2018-01-29 NOTE — Telephone Encounter (Signed)
Called patient and informed

## 2018-02-04 ENCOUNTER — Telehealth: Payer: Self-pay

## 2018-02-04 ENCOUNTER — Other Ambulatory Visit: Payer: Self-pay | Admitting: Family Medicine

## 2018-02-04 NOTE — Telephone Encounter (Signed)
Noted! Thank you

## 2018-02-04 NOTE — Telephone Encounter (Signed)
Spoke to pt on phone after hearing that she was no better per husband. Pt had completed her ZPack and is using the inhaler as well as the nasal spray. She c/o congestion, no cough, no fever, and is eating well/ pushing fluids. The pt did not sound as if she was having a hard time breathing, she was able to complete sentences without sounding SOB. She did ask about prednisone being called in for her, but I explained that her chest xray looked good per Dr. Army Melia and the antibiotic was still working. No need to give another antibiotic or change meds per Dr Army Melia. She was told if she feels very SOB despite everything we have told her, she may want to go to the hospital.

## 2018-02-11 ENCOUNTER — Other Ambulatory Visit: Payer: Self-pay | Admitting: Internal Medicine

## 2018-02-21 ENCOUNTER — Other Ambulatory Visit: Payer: Self-pay | Admitting: Internal Medicine

## 2018-02-21 DIAGNOSIS — F324 Major depressive disorder, single episode, in partial remission: Secondary | ICD-10-CM

## 2018-03-11 IMAGING — CR DG CHEST 2V
2 series · 2 of 2 positions shown · non-contrast
Comparison: Chest radiograph 05/26/2015

CLINICAL DATA: Thoracic back pain. Posterior chest pain for 2
weeks.

EXAM:
CHEST  2 VIEW

[chest ap]
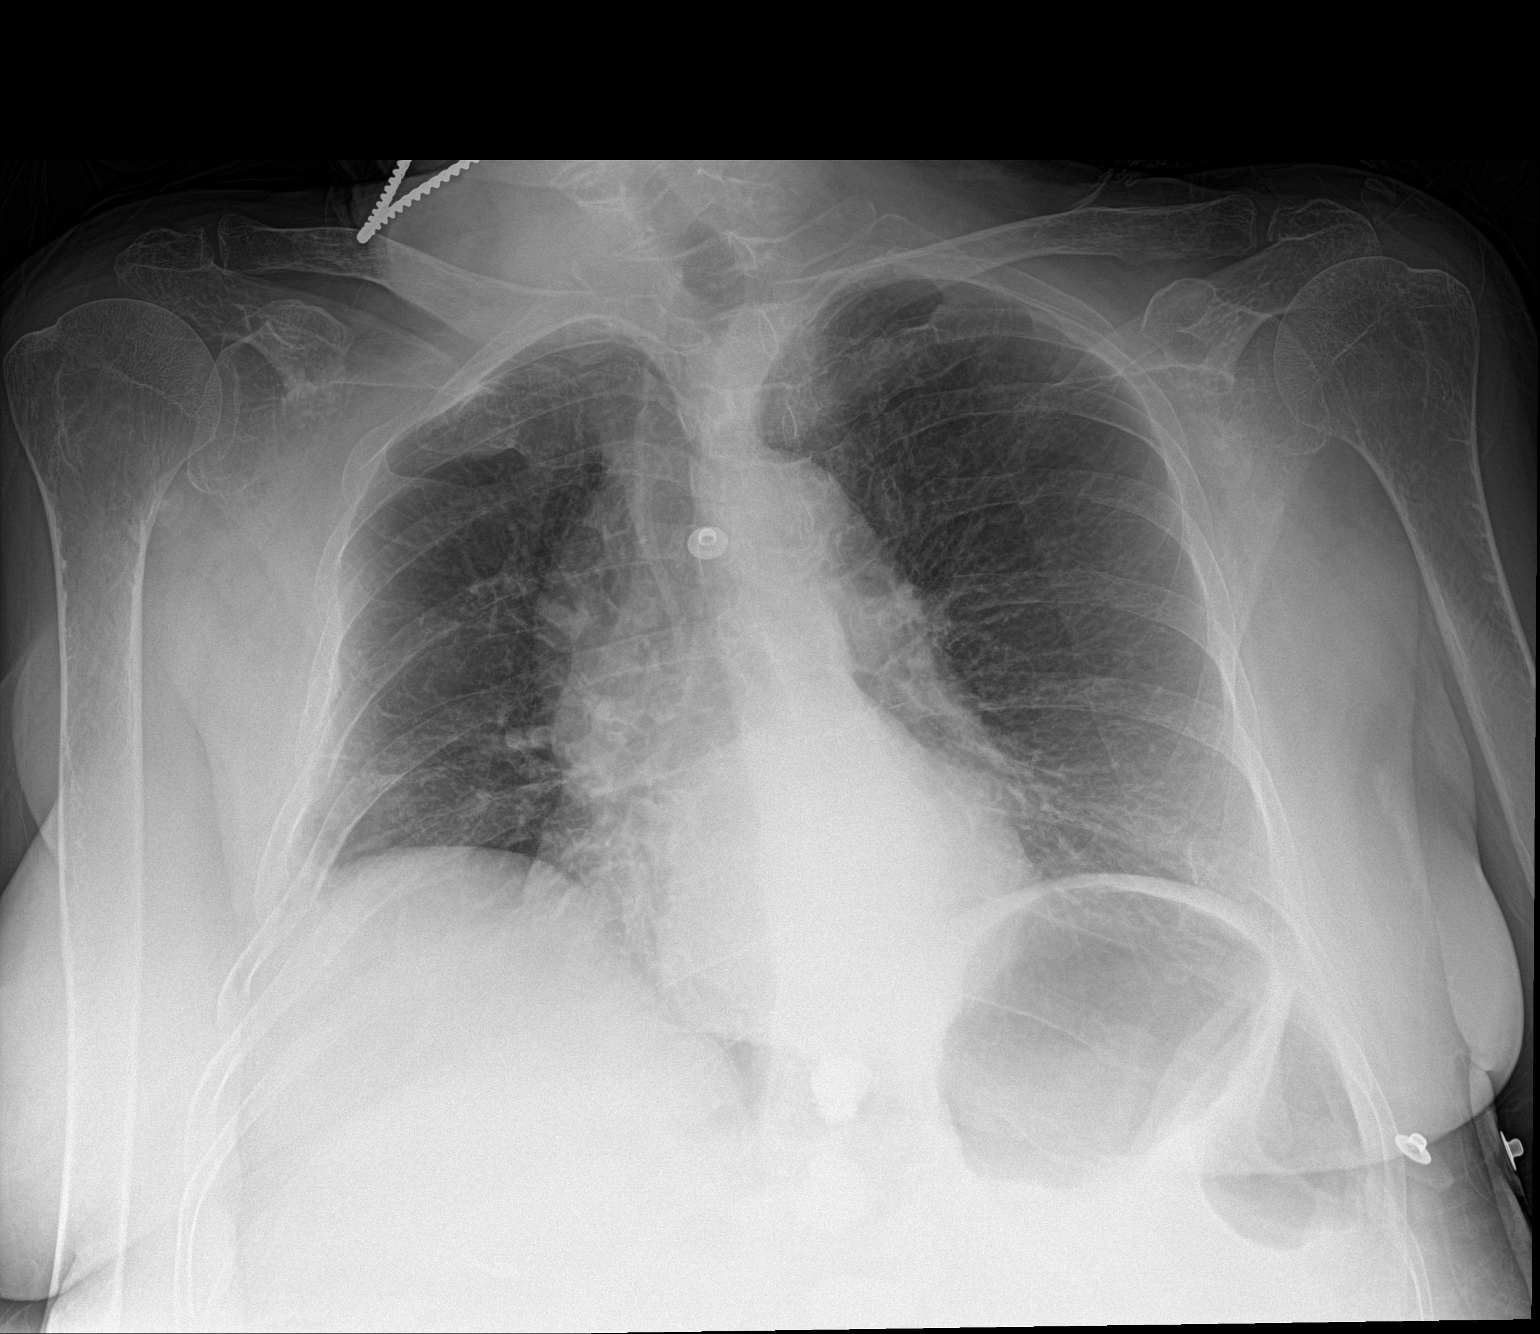

[chest lat]
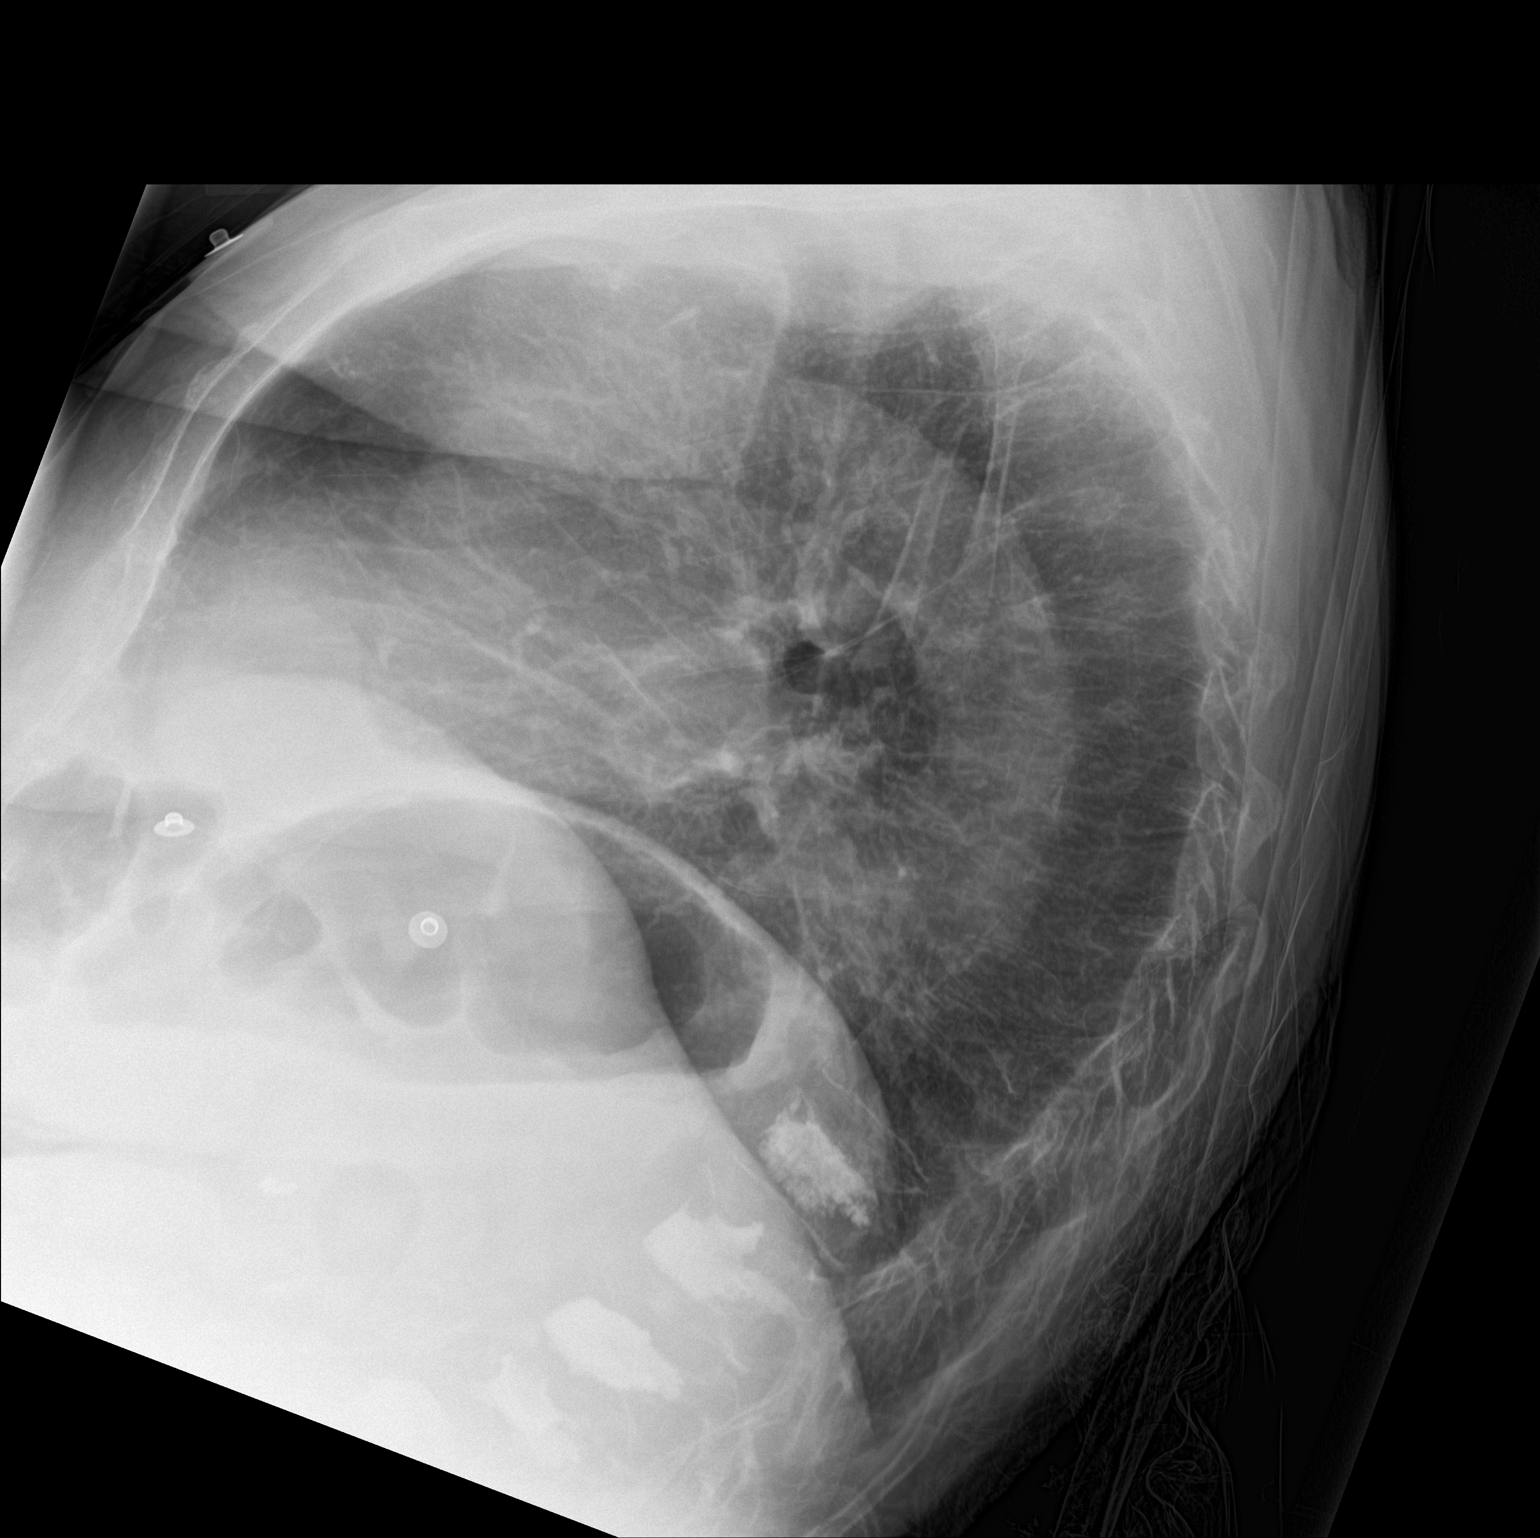

[2 of 2 positions shown; findings below may reference images not displayed]

FINDINGS: Cardiomediastinal contours are unchanged with tortuosity of the
thoracic aorta. No pulmonary edema, pleural effusion, focal airspace
disease or pneumothorax. Chronic compression deformity of T9, with
vertebral augmentation of lower thoracic vertebra. The mid and upper
thoracic spine is not well visualized due to osteopenia. There are
remote appearing right rib fractures.
IMPRESSION: 1.  No acute pulmonary process.
2. A tortuous thoracic aorta.
3. Chronic T9 compression fracture. Vertebral augmentation of
multiple lower thoracic and included upper lumbar vertebral bodies.
The mid and upper thoracic spine is not well evaluated
radiographically due to osteopenia.

## 2018-03-11 IMAGING — CT CT ABD-PELV W/ CM
2 of 5 series · 15 of 46 positions shown, 17 images · IV contrast (APPLIED)
Comparison: CT 05/24/2011

CLINICAL DATA: Left lower quadrant pain for 10 days. Diarrhea
today.

EXAM:
CT ABDOMEN AND PELVIS WITH CONTRAST
TECHNIQUE: Multidetector CT imaging of the abdomen and pelvis was performed
using the standard protocol following bolus administration of
intravenous contrast.
CONTRAST:  100mL FQ05DF-8YY IOPAMIDOL (FQ05DF-8YY) INJECTION 61%

[Series 2: axial st · axial · 0.76mm/px · z∈[-378,+6]mm · 12 of 87 slices shown, 14 images]
[im 5/87  soft-tissue]
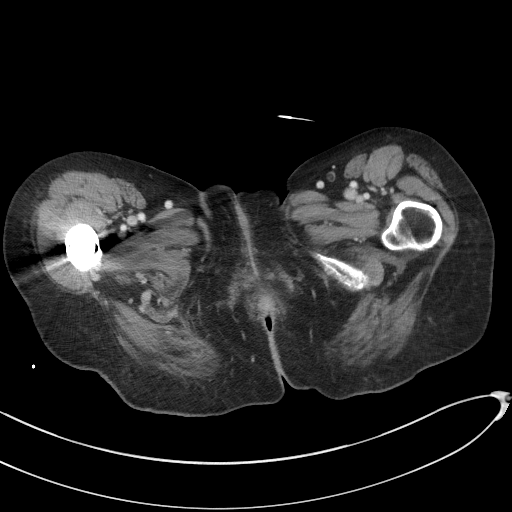
[im 5/87  bone]
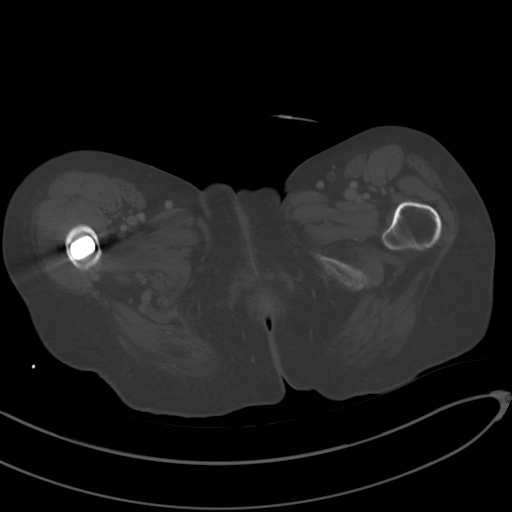
[im 15/87  soft-tissue]
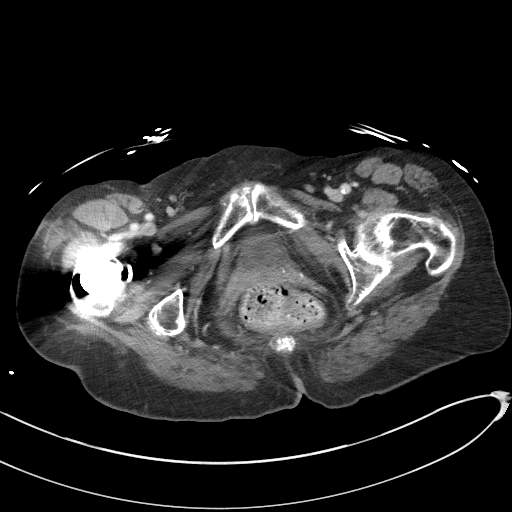
[im 20/87  soft-tissue]
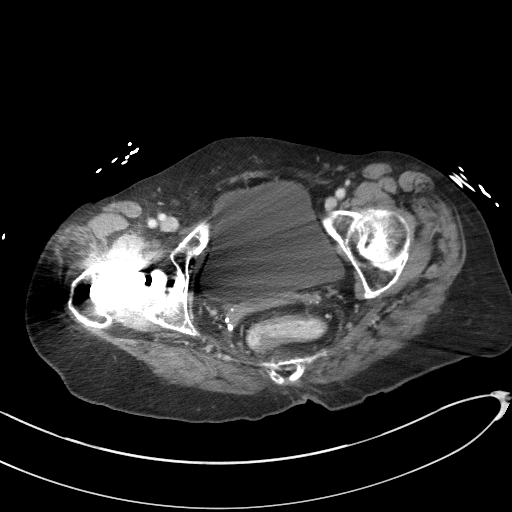
[im 24/87  soft-tissue]
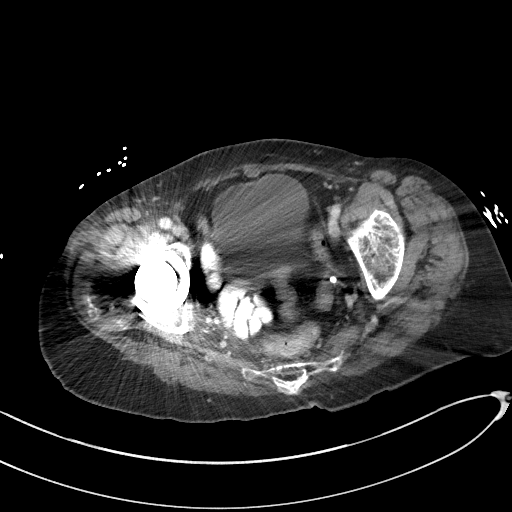
[im 34/87  soft-tissue]
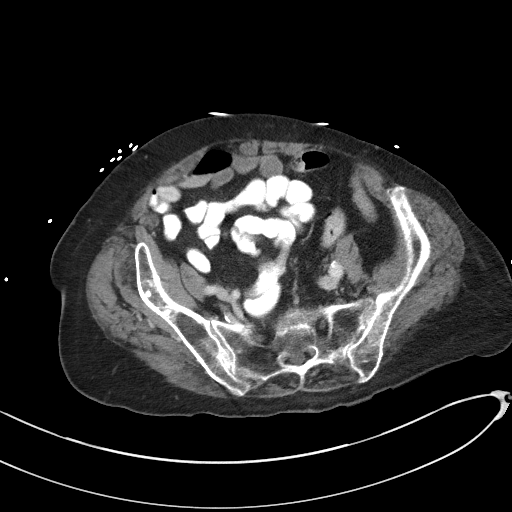
[im 39/87  soft-tissue]
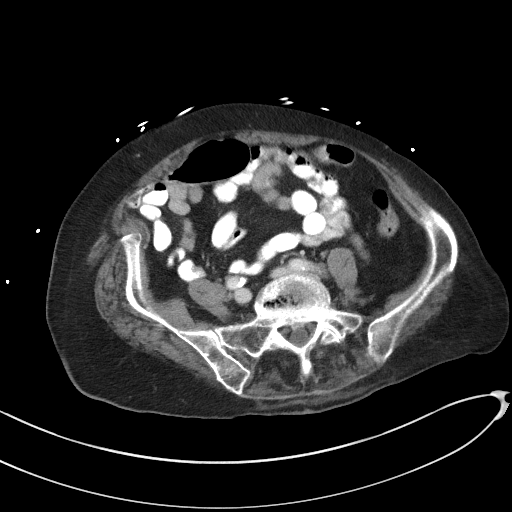
[im 48/87  soft-tissue]
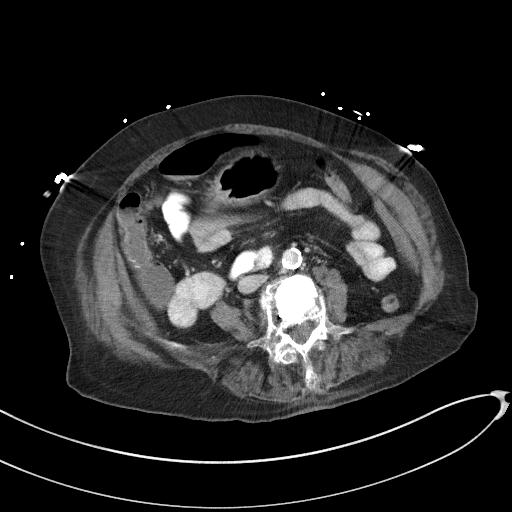
[im 53/87  soft-tissue]
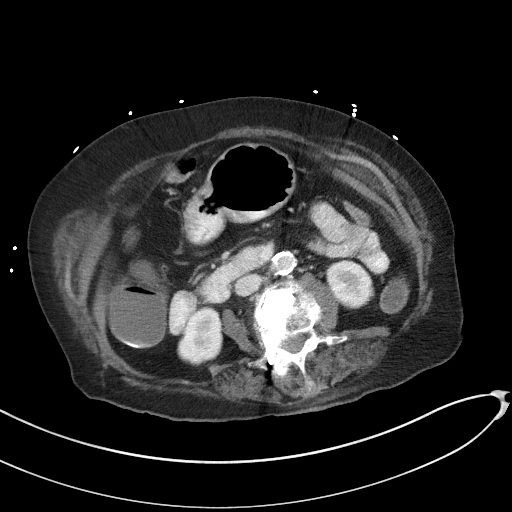
[im 63/87  soft-tissue]
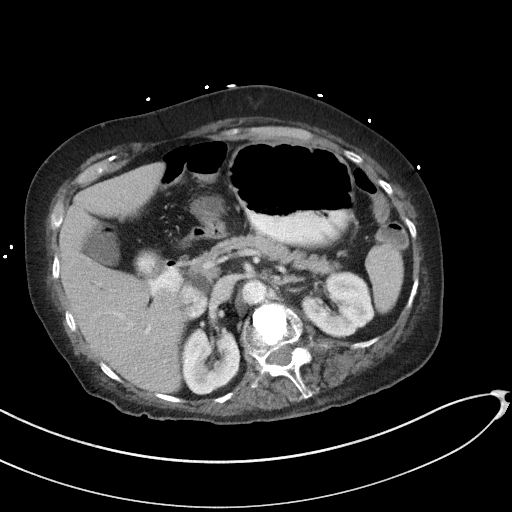
[im 63/87  bone]
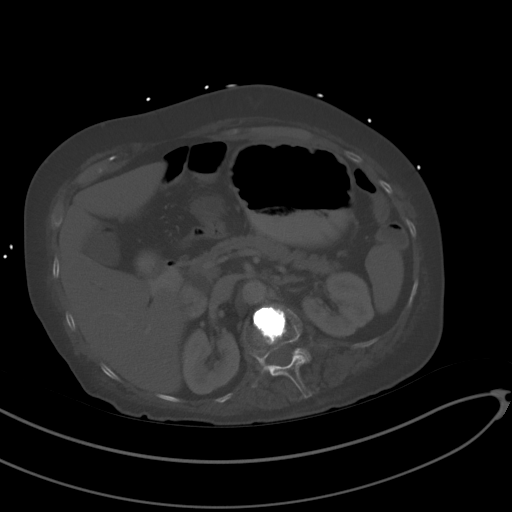
[im 67/87  soft-tissue]
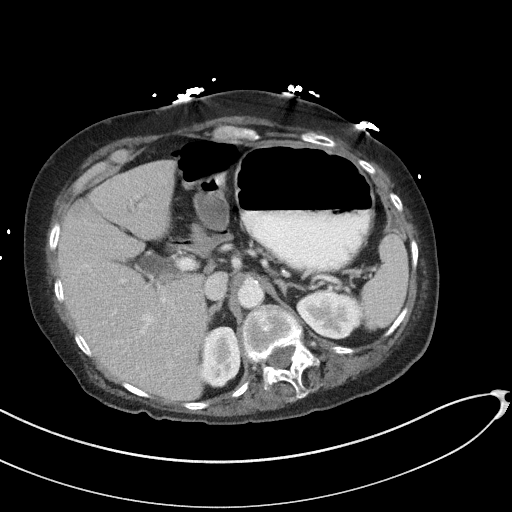
[im 72/87  soft-tissue]
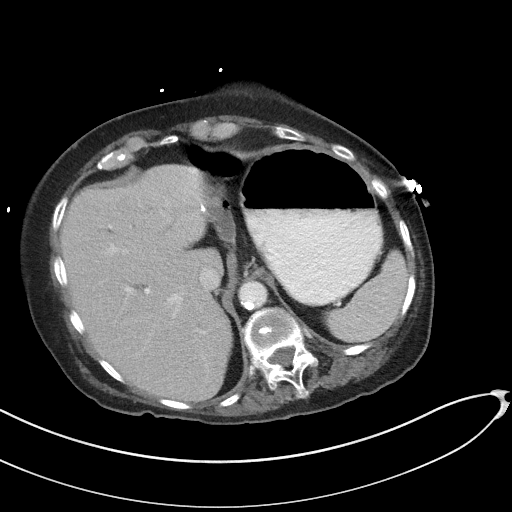
[im 82/87  soft-tissue]
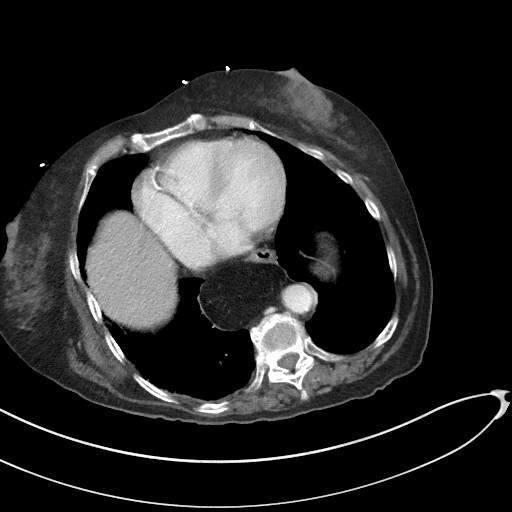

[Series 5: coronal st · coronal · 0.68mm/px · 3 of 101 slices shown]
[im 34/101  soft-tissue]
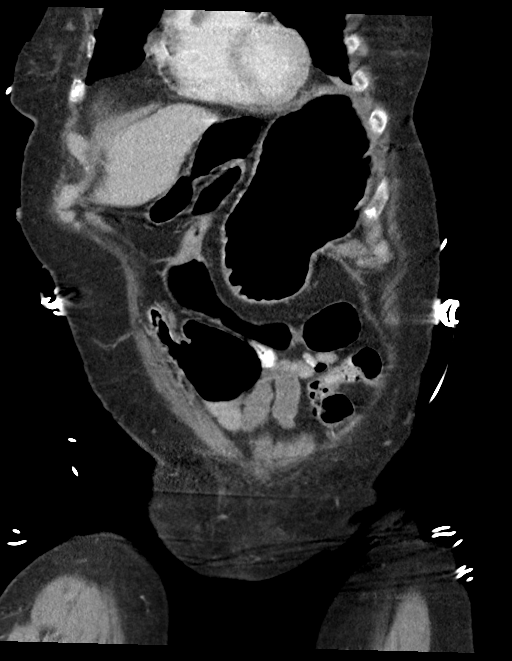
[im 45/101  soft-tissue]
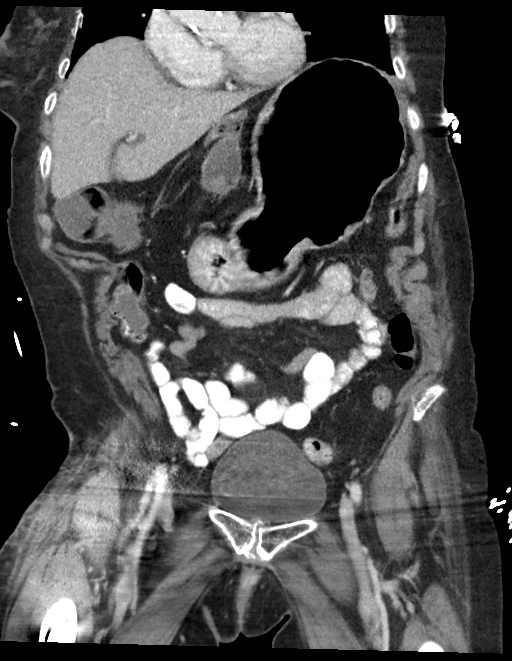
[im 56/101  soft-tissue]
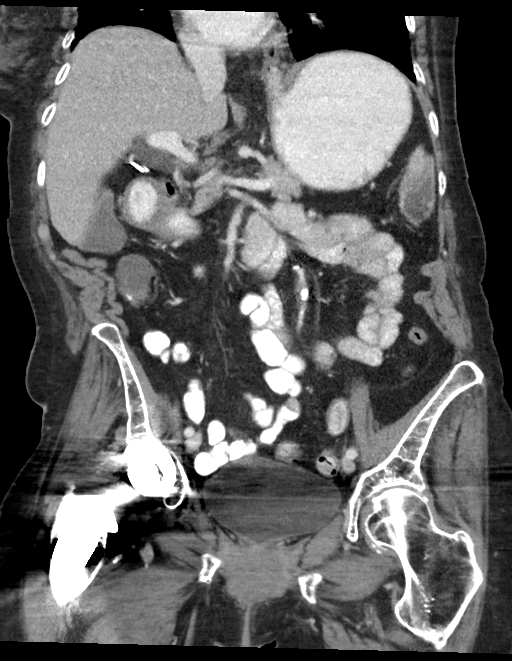

[15 of 46 positions shown; findings below may reference images not displayed]

FINDINGS: Lower chest: Hernia at the diaphragmatic hiatus involves upper
abdominal fat. This causes mild mass effect and displaces the
esophagus anteriorly. Mild dependent atelectasis in the lung bases.
Coronary artery calcifications are seen.

Hepatobiliary: Postcholecystectomy with biliary dilatation, however
smooth tapering to the duodenal insertion. No calcified
choledocholithiasis. No focal hepatic lesion is seen.

Pancreas: No ductal dilatation or inflammation. Mild parenchymal
atrophy.

Spleen: Normal in size without focal abnormality.

Adrenals/Urinary Tract: No adrenal nodule. There is symmetric renal
enhancement and excretion on delayed phase imaging. No
hydronephrosis or perinephric edema. Urinary bladder is well
distended without wall thickening.

Stomach/Bowel: Stomach distended with ingested contrast. No small
bowel obstruction or inflammation. The appendix is surgically
absent. There is liquid stool in the cecum and ascending colon.
Sigmoid colonic tortuosity with a few scattered colonic
diverticular, no diverticulitis.

Vascular/Lymphatic: No atherosclerosis and aortic tortuosity. No
aneurysm. No adenopathy in the abdomen or pelvis.

Reproductive: Status post hysterectomy. No adnexal masses.

Other: No free air, free fluid, or intra-abdominal fluid collection.

Musculoskeletal: Kyphoplasty from T11 through L4. The bones are
under mineralized. There is a T9 compression fracture, age
indeterminate. Right hip arthroplasty in place. No acute osseous
abnormalities seen.
IMPRESSION: 1. Scattered sigmoid colonic diverticulosis without diverticulitis.
Liquid stool in the cecum and ascending colon without colonic
inflammation, can be seen in the setting of diarrheal illness.
2. Post cholecystectomy with biliary dilatation. There is smooth
tapering to the duodenal insertion, suspect this is chronic and
physiologic postsurgical dilatation.
3. Abdominal aortic atherosclerosis and tortuosity.

## 2018-03-13 ENCOUNTER — Other Ambulatory Visit: Payer: Self-pay | Admitting: Internal Medicine

## 2018-03-13 DIAGNOSIS — I69359 Hemiplegia and hemiparesis following cerebral infarction affecting unspecified side: Secondary | ICD-10-CM

## 2018-03-27 ENCOUNTER — Other Ambulatory Visit: Payer: Self-pay | Admitting: Internal Medicine

## 2018-03-27 DIAGNOSIS — F324 Major depressive disorder, single episode, in partial remission: Secondary | ICD-10-CM

## 2018-05-19 ENCOUNTER — Other Ambulatory Visit: Payer: Self-pay | Admitting: Internal Medicine

## 2018-06-26 ENCOUNTER — Other Ambulatory Visit: Payer: Self-pay | Admitting: Internal Medicine

## 2018-06-26 DIAGNOSIS — F324 Major depressive disorder, single episode, in partial remission: Secondary | ICD-10-CM

## 2018-07-01 ENCOUNTER — Other Ambulatory Visit: Payer: Self-pay | Admitting: Internal Medicine

## 2018-07-01 DIAGNOSIS — E785 Hyperlipidemia, unspecified: Secondary | ICD-10-CM

## 2018-07-17 ENCOUNTER — Ambulatory Visit: Payer: PPO | Admitting: Internal Medicine

## 2018-07-17 ENCOUNTER — Encounter: Payer: PPO | Admitting: Internal Medicine

## 2018-07-21 ENCOUNTER — Other Ambulatory Visit: Payer: Self-pay | Admitting: Internal Medicine

## 2018-07-21 DIAGNOSIS — F411 Generalized anxiety disorder: Secondary | ICD-10-CM

## 2018-07-22 ENCOUNTER — Other Ambulatory Visit: Payer: Self-pay

## 2018-07-22 ENCOUNTER — Encounter: Payer: Self-pay | Admitting: Internal Medicine

## 2018-07-22 ENCOUNTER — Ambulatory Visit (INDEPENDENT_AMBULATORY_CARE_PROVIDER_SITE_OTHER): Payer: PPO | Admitting: Internal Medicine

## 2018-07-22 VITALS — BP 112/68 | HR 76 | Ht 62.0 in | Wt 126.0 lb

## 2018-07-22 DIAGNOSIS — K2100 Gastro-esophageal reflux disease with esophagitis, without bleeding: Secondary | ICD-10-CM

## 2018-07-22 DIAGNOSIS — I69359 Hemiplegia and hemiparesis following cerebral infarction affecting unspecified side: Secondary | ICD-10-CM | POA: Diagnosis not present

## 2018-07-22 DIAGNOSIS — F324 Major depressive disorder, single episode, in partial remission: Secondary | ICD-10-CM | POA: Diagnosis not present

## 2018-07-22 DIAGNOSIS — G629 Polyneuropathy, unspecified: Secondary | ICD-10-CM

## 2018-07-22 DIAGNOSIS — Z0000 Encounter for general adult medical examination without abnormal findings: Secondary | ICD-10-CM | POA: Diagnosis not present

## 2018-07-22 DIAGNOSIS — E785 Hyperlipidemia, unspecified: Secondary | ICD-10-CM

## 2018-07-22 MED ORDER — CLOPIDOGREL BISULFATE 75 MG PO TABS: 75.0000 mg | ORAL_TABLET | Freq: Every day | ORAL | 1 refills | Status: DC

## 2018-07-22 MED ORDER — TRAZODONE HCL 50 MG PO TABS: 50.0000 mg | ORAL_TABLET | Freq: Every day | ORAL | 1 refills | Status: DC

## 2018-07-22 MED ORDER — PANTOPRAZOLE SODIUM 40 MG PO TBEC: 40.0000 mg | DELAYED_RELEASE_TABLET | Freq: Two times a day (BID) | ORAL | 1 refills | Status: DC
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
## 2018-07-23 LAB — CBC WITH DIFFERENTIAL/PLATELET
BASOS ABS: 0.1 10*3/uL (ref 0.0–0.2)
Basos: 1 %
EOS (ABSOLUTE): 0.2 10*3/uL (ref 0.0–0.4)
Eos: 2 %
HEMOGLOBIN: 13.5 g/dL (ref 11.1–15.9)
Hematocrit: 40 % (ref 34.0–46.6)
IMMATURE GRANS (ABS): 0 10*3/uL (ref 0.0–0.1)
IMMATURE GRANULOCYTES: 0 %
LYMPHS: 31 %
Lymphocytes Absolute: 2.1 10*3/uL (ref 0.7–3.1)
MCH: 35.8 pg — AB (ref 26.6–33.0)
MCHC: 33.8 g/dL (ref 31.5–35.7)
MCV: 106 fL — ABNORMAL HIGH (ref 79–97)
MONOCYTES: 7 %
Monocytes Absolute: 0.5 10*3/uL (ref 0.1–0.9)
NEUTROS PCT: 59 %
Neutrophils Absolute: 4 10*3/uL (ref 1.4–7.0)
PLATELETS: 222 10*3/uL (ref 150–450)
RBC: 3.77 x10E6/uL (ref 3.77–5.28)
RDW: 11.6 % — ABNORMAL LOW (ref 11.7–15.4)
WBC: 6.8 10*3/uL (ref 3.4–10.8)

## 2018-07-23 LAB — COMPREHENSIVE METABOLIC PANEL
ALBUMIN: 5 g/dL — AB (ref 3.7–4.7)
ALT: 19 IU/L (ref 0–32)
AST: 29 IU/L (ref 0–40)
Albumin/Globulin Ratio: 2.2 (ref 1.2–2.2)
Alkaline Phosphatase: 63 IU/L (ref 39–117)
BILIRUBIN TOTAL: 0.2 mg/dL (ref 0.0–1.2)
BUN/Creatinine Ratio: 20 (ref 12–28)
BUN: 11 mg/dL (ref 8–27)
CALCIUM: 9.9 mg/dL (ref 8.7–10.3)
CHLORIDE: 98 mmol/L (ref 96–106)
CO2: 23 mmol/L (ref 20–29)
CREATININE: 0.56 mg/dL — AB (ref 0.57–1.00)
GFR calc non Af Amer: 92 mL/min/{1.73_m2} (ref >59)
GFR, EST AFRICAN AMERICAN: 105 mL/min/{1.73_m2} (ref >59)
GLUCOSE: 91 mg/dL (ref 65–99)
Globulin, Total: 2.3 g/dL (ref 1.5–4.5)
Potassium: 4.6 mmol/L (ref 3.5–5.2)
Sodium: 137 mmol/L (ref 134–144)
TOTAL PROTEIN: 7.3 g/dL (ref 6.0–8.5)

## 2018-07-23 LAB — LIPID PANEL
Chol/HDL Ratio: 2.2 ratio (ref 0.0–4.4)
Cholesterol, Total: 223 mg/dL — ABNORMAL HIGH (ref 100–199)
HDL: 103 mg/dL (ref >39)
LDL CALC: 96 mg/dL (ref 0–99)
Triglycerides: 119 mg/dL (ref 0–149)
VLDL CHOLESTEROL CAL: 24 mg/dL (ref 5–40)

## 2018-07-23 LAB — TSH: TSH: 0.942 u[IU]/mL (ref 0.450–4.500)

## 2018-07-23 LAB — VITAMIN B12: Vitamin B-12: 1522 pg/mL — ABNORMAL HIGH (ref 232–1245)

## 2018-08-18 ENCOUNTER — Other Ambulatory Visit: Payer: Self-pay | Admitting: Internal Medicine

## 2018-08-18 DIAGNOSIS — F411 Generalized anxiety disorder: Secondary | ICD-10-CM

## 2018-08-23 ENCOUNTER — Other Ambulatory Visit: Payer: Self-pay | Admitting: Internal Medicine

## 2018-08-23 DIAGNOSIS — F324 Major depressive disorder, single episode, in partial remission: Secondary | ICD-10-CM

## 2018-09-15 ENCOUNTER — Other Ambulatory Visit: Payer: Self-pay | Admitting: Internal Medicine

## 2018-09-15 DIAGNOSIS — F411 Generalized anxiety disorder: Secondary | ICD-10-CM

## 2018-11-21 ENCOUNTER — Other Ambulatory Visit: Payer: Self-pay | Admitting: Internal Medicine

## 2018-11-21 DIAGNOSIS — F411 Generalized anxiety disorder: Secondary | ICD-10-CM

## 2018-12-28 ENCOUNTER — Other Ambulatory Visit: Payer: Self-pay | Admitting: Internal Medicine

## 2018-12-28 DIAGNOSIS — E785 Hyperlipidemia, unspecified: Secondary | ICD-10-CM

## 2019-01-10 ENCOUNTER — Other Ambulatory Visit: Payer: Self-pay | Admitting: Internal Medicine

## 2019-01-10 DIAGNOSIS — F324 Major depressive disorder, single episode, in partial remission: Secondary | ICD-10-CM

## 2019-01-12 ENCOUNTER — Other Ambulatory Visit: Payer: Self-pay | Admitting: Internal Medicine

## 2019-01-12 DIAGNOSIS — K2100 Gastro-esophageal reflux disease with esophagitis, without bleeding: Secondary | ICD-10-CM

## 2019-01-18 ENCOUNTER — Other Ambulatory Visit: Payer: Self-pay | Admitting: Internal Medicine

## 2019-01-18 DIAGNOSIS — F411 Generalized anxiety disorder: Secondary | ICD-10-CM

## 2019-01-23 ENCOUNTER — Ambulatory Visit: Payer: PPO | Admitting: Internal Medicine

## 2019-01-26 ENCOUNTER — Encounter: Payer: Self-pay | Admitting: Internal Medicine

## 2019-01-26 ENCOUNTER — Other Ambulatory Visit: Payer: Self-pay

## 2019-01-26 ENCOUNTER — Ambulatory Visit (INDEPENDENT_AMBULATORY_CARE_PROVIDER_SITE_OTHER): Payer: PPO | Admitting: Internal Medicine

## 2019-01-26 VITALS — BP 140/86 | HR 88 | Ht 62.0 in | Wt 128.0 lb

## 2019-01-26 DIAGNOSIS — I69359 Hemiplegia and hemiparesis following cerebral infarction affecting unspecified side: Secondary | ICD-10-CM | POA: Diagnosis not present

## 2019-01-26 DIAGNOSIS — F324 Major depressive disorder, single episode, in partial remission: Secondary | ICD-10-CM | POA: Diagnosis not present

## 2019-01-26 DIAGNOSIS — M419 Scoliosis, unspecified: Secondary | ICD-10-CM | POA: Insufficient documentation

## 2019-01-26 MED ORDER — CLOPIDOGREL BISULFATE 75 MG PO TABS: 75.0000 mg | ORAL_TABLET | Freq: Every day | ORAL | 1 refills | Status: DC
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
## 2019-02-11 ENCOUNTER — Ambulatory Visit: Payer: PPO | Admitting: Podiatry

## 2019-02-11 ENCOUNTER — Other Ambulatory Visit: Payer: Self-pay

## 2019-02-11 ENCOUNTER — Encounter: Payer: Self-pay | Admitting: Podiatry

## 2019-02-11 ENCOUNTER — Ambulatory Visit: Payer: PPO

## 2019-02-11 DIAGNOSIS — Q828 Other specified congenital malformations of skin: Secondary | ICD-10-CM

## 2019-02-11 DIAGNOSIS — M79676 Pain in unspecified toe(s): Secondary | ICD-10-CM

## 2019-02-11 DIAGNOSIS — B351 Tinea unguium: Secondary | ICD-10-CM | POA: Diagnosis not present

## 2019-02-11 DIAGNOSIS — M778 Other enthesopathies, not elsewhere classified: Secondary | ICD-10-CM

## 2019-02-11 NOTE — Progress Notes (Signed)
Subjective:  Patient ID: Valerie Hanna, female    DOB: 1940-09-30,  MRN: [id number] HPI Chief Complaint  Patient presents with  . Callouses    Patient presents today for painful callous lateral side of right foot and painful toenail right great nail   She reports the side of her foot is sensitive to touch at callous and when shoes rub area and toenail is painful to touch.  . Nail Problem    78 y.o. female presents with the above complaint.   ROS: Denies fever chills nausea vomiting muscle aches pains calf pain back pain chest pain shortness of breath.  Past Medical History:  Diagnosis Date  . Allergy   . Anemia   . Anxiety   . Arthritis   . Cancer ([place])    melanoma  ON HER BACK MANY YRS AGO  . Constipation   . Depression   . GERD (gastroesophageal reflux disease)   . H/O hiatal hernia   . Headache(784.0)   . High cholesterol   . History of kidney infection   . Hypertension    no longer taking meds since stroke  . Osteoporosis   . Pleurisy    being treated for this now 01/25/14 (left side)  . Stroke ([place])    2014, WITH LEFT SIDED WEAKNESS   Past Surgical History:  Procedure Laterality Date  . ABDOMINAL HYSTERECTOMY    . APPENDECTOMY    . BACK SURGERY     kyphoplasty  . bladder tack    . CHOLECYSTECTOMY    . COLONOSCOPY    . DILATION AND CURETTAGE OF UTERUS    . EYE SURGERY Right    cataract removed  . EYE SURGERY Right    "bubble" put in for a hole in retina  . FOOT SURGERY Bilateral   . KYPHOPLASTY N/A 01/28/2014   Procedure: LUMBAR FOUR KYPHOPLASTY;  Surgeon: Ashok Pall, MD;  Location: [facility];  Service: Neurosurgery;  Laterality: N/A;  L4 Kyphoplasty  . KYPHOPLASTY N/A 04/18/2016   Procedure: KYPHOPLASTY Thoracic eight-Thoracic ten;  Surgeon: Ashok Pall, MD;  Location: [facility];  Service: Neurosurgery;  Laterality: N/A;  KYPHOPLASTY T8-T10  . TOTAL HIP ARTHROPLASTY Right 05/26/2015   Procedure: RIGHT BIPOLAR VS TOTAL HIP ANTERIOR APPROACH;  Surgeon:  Mcarthur Rossetti, MD;  Location: [facility];  Service: Orthopedics;  Laterality: Right;    Current Outpatient Medications:  .  albuterol (PROVENTIL HFA;VENTOLIN HFA) 108 (90 Base) MCG/ACT inhaler, Inhale 2 puffs into the lungs every 6 (six) hours as needed for wheezing or shortness of breath., Disp: 1 Inhaler, Rfl: 0 .  ALPRAZolam (XANAX) 0.25 MG tablet, TAKE 1 TABLET (0.25 MG TOTAL) BY MOUTH AT BEDTIME AS NEEDED FOR ANXIETY., Disp: 30 tablet, Rfl: 1 .  CALCIUM PO, Take 1 tablet by mouth daily., Disp: , Rfl:  .  clopidogrel (PLAVIX) 75 MG tablet, Take 1 tablet (75 mg total) by mouth daily., Disp: 90 tablet, Rfl: 1 .  Cyanocobalamin (VITAMIN B-12 PO), Take 1 tablet by mouth daily., Disp: , Rfl:  .  fexofenadine (ALLEGRA) 60 MG tablet, Take 60 mg by mouth daily., Disp: , Rfl:  .  ipratropium (ATROVENT) 0.06 % nasal spray, PLACE 2 SPRAYS INTO BOTH NOSTRILS 2 (TWO) TIMES DAILY., Disp: 45 mL, Rfl: 3 .  mirtazapine (REMERON) 30 MG tablet, TAKE 1 TABLET BY MOUTH AT BEDTIME, Disp: 30 tablet, Rfl: 5 .  Multiple Vitamin (MULTIVITAMIN WITH MINERALS) TABS tablet, Take 1 tablet by mouth daily., Disp: , Rfl:  .  pantoprazole (PROTONIX) 40 MG tablet, TAKE 1 TABLET BY MOUTH TWICE A DAY, Disp: 180 tablet, Rfl: 1 .  sertraline (ZOLOFT) 100 MG tablet, TAKE 1 TABLET (100 MG TOTAL) BY MOUTH AT BEDTIME., Disp: 90 tablet, Rfl: 1 .  simvastatin (ZOCOR) 40 MG tablet, TAKE 1 TABLET (40 MG TOTAL) BY MOUTH DAILY AT 6 PM., Disp: 90 tablet, Rfl: 1 .  traZODone (DESYREL) 50 MG tablet, Take 1 tablet (50 mg total) by mouth at bedtime., Disp: 90 tablet, Rfl: 1  Allergies  Allergen Reactions  . Cefuroxime Axetil Nausea And Vomiting  . Aspirin Other (See Comments)    Kidney problems   Review of Systems Objective:  There were no vitals filed for this visit.  General: Well developed, nourished, in no acute distress, alert and oriented x3   Dermatological: Skin is warm, dry and supple bilateral. Nails x 10 are thick  dystrophic sharply marked mycotic and incurvated.; remaining integument appears unremarkable at this time. There are no open sores, no preulcerative lesions, no rash or signs of infection present.  Vascular: Dorsalis Pedis artery and Posterior Tibial artery pedal pulses are 2/4 bilateral with immedate capillary fill time. Pedal hair growth present. No varicosities and no lower extremity edema present bilateral.   Neruologic: Grossly intact via light touch bilateral. Vibratory intact via tuning fork bilateral. Protective threshold with Semmes Wienstein monofilament intact to all pedal sites bilateral. Patellar and Achilles deep tendon reflexes 2+ bilateral. No Babinski or clonus noted bilateral.   Musculoskeletal: No gross boney pedal deformities bilateral. No pain, crepitus, or limitation noted with foot and ankle range of motion bilateral. Muscular strength 5/5 in all groups tested bilateral.  Gait: Unassisted, Nonantalgic.    Radiographs:  None taken  Assessment & Plan:   Assessment: Painful porokeratotic lesion styloid process fifth metatarsal right foot.  Painful hammertoe deformities 1 through 5 bilateral with pain in limb secondary to onychomycosis and sharply incurvated nail margins.  Plan: Discussed etiology pathology conservative versus surgical therapies.  Debrided all reactive hyperkeratotic tissue debrided toenails 1 through 5 bilateral.     Max T. [place], [place]

## 2019-02-13 ENCOUNTER — Other Ambulatory Visit: Payer: Self-pay | Admitting: Internal Medicine

## 2019-02-13 DIAGNOSIS — F324 Major depressive disorder, single episode, in partial remission: Secondary | ICD-10-CM

## 2019-03-17 ENCOUNTER — Other Ambulatory Visit: Payer: Self-pay | Admitting: Internal Medicine

## 2019-03-17 DIAGNOSIS — F411 Generalized anxiety disorder: Secondary | ICD-10-CM

## 2019-04-07 ENCOUNTER — Other Ambulatory Visit: Payer: Self-pay | Admitting: Internal Medicine

## 2019-04-07 DIAGNOSIS — F324 Major depressive disorder, single episode, in partial remission: Secondary | ICD-10-CM

## 2019-04-13 ENCOUNTER — Ambulatory Visit (INDEPENDENT_AMBULATORY_CARE_PROVIDER_SITE_OTHER): Payer: PPO

## 2019-04-13 VITALS — Ht 62.0 in | Wt 130.0 lb

## 2019-04-13 DIAGNOSIS — Z0000 Encounter for general adult medical examination without abnormal findings: Secondary | ICD-10-CM | POA: Diagnosis not present

## 2019-04-13 NOTE — Patient Instructions (Signed)
Valerie Hanna , Thank you for taking time to come for your Medicare Wellness Visit. I appreciate your ongoing commitment to your health goals. Please review the following plan we discussed and let me know if I can assist you in the future.   Screening recommendations/referrals: Colonoscopy: no longer required Mammogram: no longer required Bone Density: no longer required Recommended yearly ophthalmology/optometry visit for glaucoma screening and checkup Recommended yearly dental visit for hygiene and checkup  Vaccinations: Influenza vaccine: postponed Pneumococcal vaccine: postponed Tdap vaccine: due Shingles vaccine: Shingrix discussed. Please contact your pharmacy for coverage information.   Advanced directives: Please bring a copy of your health care power of attorney and living will to the office at your convenience.  Conditions/risks identified: Recommend drinking 6-8 glasses of water per day  Next appointment: Please follow up in one year for your Medicare Annual Wellness visit.     Preventive Care 70 Years and Older, Female Preventive care refers to lifestyle choices and visits with your health care provider that can promote health and wellness. What does preventive care include?  A yearly physical exam. This is also called an annual well check.  Dental exams once or twice a year.  Routine eye exams. Ask your health care provider how often you should have your eyes checked.  Personal lifestyle choices, including:  Daily care of your teeth and gums.  Regular physical activity.  Eating a healthy diet.  Avoiding tobacco and drug use.  Limiting alcohol use.  Practicing safe sex.  Taking low-dose aspirin every day.  Taking vitamin and mineral supplements as recommended by your health care provider. What happens during an annual well check? The services and screenings done by your health care provider during your annual well check will depend on your age, overall  health, lifestyle risk factors, and family history of disease. Counseling  Your health care provider may ask you questions about your:  Alcohol use.  Tobacco use.  Drug use.  Emotional well-being.  Home and relationship well-being.  Sexual activity.  Eating habits.  History of falls.  Memory and ability to understand (cognition).  Work and work Statistician.  Reproductive health. Screening  You may have the following tests or measurements:  Height, weight, and BMI.  Blood pressure.  Lipid and cholesterol levels. These may be checked every 5 years, or more frequently if you are over 63 years old.  Skin check.  Lung cancer screening. You may have this screening every year starting at age 1 if you have a 30-pack-year history of smoking and currently smoke or have quit within the past 15 years.  Fecal occult blood test (FOBT) of the stool. You may have this test every year starting at age 70.  Flexible sigmoidoscopy or colonoscopy. You may have a sigmoidoscopy every 5 years or a colonoscopy every 10 years starting at age 37.  Hepatitis C blood test.  Hepatitis B blood test.  Sexually transmitted disease (STD) testing.  Diabetes screening. This is done by checking your blood sugar (glucose) after you have not eaten for a while (fasting). You may have this done every 1-3 years.  Bone density scan. This is done to screen for osteoporosis. You may have this done starting at age 42.  Mammogram. This may be done every 1-2 years. Talk to your health care provider about how often you should have regular mammograms. Talk with your health care provider about your test results, treatment options, and if necessary, the need for more tests. Vaccines  Your  health care provider may recommend certain vaccines, such as:  Influenza vaccine. This is recommended every year.  Tetanus, diphtheria, and acellular pertussis (Tdap, Td) vaccine. You may need a Td booster every 10 years.   Zoster vaccine. You may need this after age 98.  Pneumococcal 13-valent conjugate (PCV13) vaccine. One dose is recommended after age 37.  Pneumococcal polysaccharide (PPSV23) vaccine. One dose is recommended after age 64. Talk to your health care provider about which screenings and vaccines you need and how often you need them. This information is not intended to replace advice given to you by your health care provider. Make sure you discuss any questions you have with your health care provider. Document Released: 06/17/2015 Document Revised: 02/08/2016 Document Reviewed: 03/22/2015 Elsevier Interactive Patient Education  2017 [place] Prevention in the Home Falls can cause injuries. They can happen to people of all ages. There are many things you can do to make your home safe and to help prevent falls. What can I do on the outside of my home?  Regularly fix the edges of walkways and driveways and fix any cracks.  Remove anything that might make you trip as you walk through a door, such as a raised step or threshold.  Trim any bushes or trees on the path to your home.  Use bright outdoor lighting.  Clear any walking paths of anything that might make someone trip, such as rocks or tools.  Regularly check to see if handrails are loose or broken. Make sure that both sides of any steps have handrails.  Any raised decks and porches should have guardrails on the edges.  Have any leaves, snow, or ice cleared regularly.  Use sand or salt on walking paths during winter.  Clean up any spills in your garage right away. This includes oil or grease spills. What can I do in the bathroom?  Use night lights.  Install grab bars by the toilet and in the tub and shower. Do not use towel bars as grab bars.  Use non-skid mats or decals in the tub or shower.  If you need to sit down in the shower, use a plastic, non-slip stool.  Keep the floor dry. Clean up any water that spills  on the floor as soon as it happens.  Remove soap buildup in the tub or shower regularly.  Attach bath mats securely with double-sided non-slip rug tape.  Do not have throw rugs and other things on the floor that can make you trip. What can I do in the bedroom?  Use night lights.  Make sure that you have a light by your bed that is easy to reach.  Do not use any sheets or blankets that are too big for your bed. They should not hang down onto the floor.  Have a firm chair that has side arms. You can use this for support while you get dressed.  Do not have throw rugs and other things on the floor that can make you trip. What can I do in the kitchen?  Clean up any spills right away.  Avoid walking on wet floors.  Keep items that you use a lot in easy-to-reach places.  If you need to reach something above you, use a strong step stool that has a grab bar.  Keep electrical cords out of the way.  Do not use floor polish or wax that makes floors slippery. If you must use wax, use non-skid floor wax.  Do not  have throw rugs and other things on the floor that can make you trip. What can I do with my stairs?  Do not leave any items on the stairs.  Make sure that there are handrails on both sides of the stairs and use them. Fix handrails that are broken or loose. Make sure that handrails are as long as the stairways.  Check any carpeting to make sure that it is firmly attached to the stairs. Fix any carpet that is loose or worn.  Avoid having throw rugs at the top or bottom of the stairs. If you do have throw rugs, attach them to the floor with carpet tape.  Make sure that you have a light switch at the top of the stairs and the bottom of the stairs. If you do not have them, ask someone to add them for you. What else can I do to help prevent falls?  Wear shoes that:  Do not have high heels.  Have rubber bottoms.  Are comfortable and fit you well.  Are closed at the toe. Do  not wear sandals.  If you use a stepladder:  Make sure that it is fully opened. Do not climb a closed stepladder.  Make sure that both sides of the stepladder are locked into place.  Ask someone to hold it for you, if possible.  Clearly mark and make sure that you can see:  Any grab bars or handrails.  First and last steps.  Where the edge of each step is.  Use tools that help you move around (mobility aids) if they are needed. These include:  Canes.  Walkers.  Scooters.  Crutches.  Turn on the lights when you go into a dark area. Replace any light bulbs as soon as they burn out.  Set up your furniture so you have a clear path. Avoid moving your furniture around.  If any of your floors are uneven, fix them.  If there are any pets around you, be aware of where they are.  Review your medicines with your doctor. Some medicines can make you feel dizzy. This can increase your chance of falling. Ask your doctor what other things that you can do to help prevent falls. This information is not intended to replace advice given to you by your health care provider. Make sure you discuss any questions you have with your health care provider. Document Released: 03/17/2009 Document Revised: 10/27/2015 Document Reviewed: 06/25/2014 Elsevier Interactive Patient Education  2017 [employer].

## 2019-04-13 NOTE — Progress Notes (Signed)
Subjective:   Valerie Hanna is a 78 y.o. female who presents for Medicare Annual (Subsequent) preventive examination.  Virtual Visit via Telephone Note  I connected with Valerie Hanna on 04/13/19 at 10:40 AM EST by telephone and verified that I am speaking with the correct person using two identifiers.  Medicare Annual Wellness visit completed telephonically due to Covid-19 pandemic.   Location: Patient: home Provider: office   I discussed the limitations, risks, security and privacy concerns of performing an evaluation and management service by telephone and the availability of in person appointments. The patient expressed understanding and agreed to proceed.  Some vital signs may be absent or patient reported.   Clemetine Marker, LPN     Review of Systems:   Cardiac Risk Factors include: advanced age (>83men, >43 women);dyslipidemia     Objective:     Vitals: Ht 5\' 2"  (1.575 m)    Wt 130 lb (59 kg)    BMI 23.78 kg/m   Body mass index is 23.78 kg/m.  Advanced Directives 04/13/2019 01/20/2018 01/14/2017 04/18/2016 04/16/2016 07/20/2015 05/26/2015  Does Patient Have a Medical Advance Directive? Yes Yes Yes No No Yes Yes  Type of Paramedic of [place];Living will [place];Living will [place];Living will - - Living will Living will  Copy of [place] in Chart? No - copy requested No - copy requested No - copy requested - - - -  Would patient like information on creating a medical advance directive? - - - No - patient declined information - - -    Tobacco Social History   Tobacco Use  Smoking Status Former Smoker   Packs/day: 0.50   Years: 30.00   Pack years: 15.00   Types: Cigarettes   Quit date: 04/15/2016   Years since quitting: 2.9  Smokeless Tobacco Never Used  Tobacco Comment   smoking cessation materials not required     Counseling given: Not Answered Comment: smoking  cessation materials not required   Clinical Intake:  Pre-visit preparation completed: Yes  Pain : No/denies pain     BMI - recorded: 23.78 Nutritional Status: BMI of 19-24  Normal Nutritional Risks: None Diabetes: No  How often do you need to have someone help you when you read instructions, pamphlets, or other written materials from your doctor or pharmacy?: 1 - Never  Interpreter Needed?: No  Information entered by :: Clemetine Marker LPN  Past Medical History:  Diagnosis Date   Allergy    Anemia    Anxiety    Arthritis    Cancer ([facility])    melanoma  ON HER BACK MANY YRS AGO   Constipation    Depression    GERD (gastroesophageal reflux disease)    H/O hiatal hernia    Headache(784.0)    High cholesterol    History of kidney infection    Hypertension    no longer taking meds since stroke   Osteoporosis    Pleurisy    being treated for this now 01/25/14 (left side)   Stroke ([facility])    2014, WITH LEFT SIDED WEAKNESS   Past Surgical History:  Procedure Laterality Date   ABDOMINAL HYSTERECTOMY     APPENDECTOMY     BACK SURGERY     kyphoplasty   bladder tack     CHOLECYSTECTOMY     COLONOSCOPY     DILATION AND CURETTAGE OF UTERUS     EYE SURGERY Right  cataract removed   EYE SURGERY Right    "bubble" put in for a hole in retina   FOOT SURGERY Bilateral    KYPHOPLASTY N/A 01/28/2014   Procedure: LUMBAR FOUR KYPHOPLASTY;  Surgeon: Ashok Pall, MD;  Location: [facility];  Service: Neurosurgery;  Laterality: N/A;  L4 Kyphoplasty   KYPHOPLASTY N/A 04/18/2016   Procedure: KYPHOPLASTY Thoracic eight-Thoracic ten;  Surgeon: Ashok Pall, MD;  Location: [facility];  Service: Neurosurgery;  Laterality: N/A;  KYPHOPLASTY T8-T10   TOTAL HIP ARTHROPLASTY Right 05/26/2015   Procedure: RIGHT BIPOLAR VS TOTAL HIP ANTERIOR APPROACH;  Surgeon: Mcarthur Rossetti, MD;  Location: [facility];  Service: Orthopedics;  Laterality: Right;   Family History    Problem Relation Age of Onset   Healthy Sister    Healthy Sister    Social History   Socioeconomic History   Marital status: Married    Spouse name: Not on file   Number of children: 1   Years of education: Not on file   Highest education level: 11th grade  Occupational History   Occupation: Retired  Scientist, product/process development strain: Not hard at [employer] insecurity    Worry: Never true    Inability: Never true   Transportation needs    Medical: No    Non-medical: No  Tobacco Use   Smoking status: Former Smoker    Packs/day: 0.50    Years: 30.00    Pack years: 15.00    Types: Cigarettes    Quit date: 04/15/2016    Years since quitting: 2.9   Smokeless tobacco: Never Used   Tobacco comment: smoking cessation materials not required  Substance and Sexual Activity   Alcohol use: Yes    Alcohol/week: 14.0 - 21.0 standard drinks    Types: 14 - 21 Glasses of wine per week   Drug use: No   Sexual activity: Not Currently  Lifestyle   Physical activity    Days per week: 0 days    Minutes per session: 0 min   Stress: Rather much  Relationships   Social connections    Talks on phone: Patient refused    Gets together: Patient refused    Attends religious service: Patient refused    Active member of club or organization: Patient refused    Attends meetings of clubs or organizations: Patient refused    Relationship status: Married  Other Topics Concern   Not on file  Social History Narrative   Not on file    Outpatient Encounter Medications as of 04/13/2019  Medication Sig   albuterol (PROVENTIL HFA;VENTOLIN HFA) 108 (90 Base) MCG/ACT inhaler Inhale 2 puffs into the lungs every 6 (six) hours as needed for wheezing or shortness of breath.   ALPRAZolam (XANAX) 0.25 MG tablet Take 1 tablet (0.25 mg total) by mouth at bedtime as needed for anxiety.   CALCIUM PO Take 1 tablet by mouth daily.   clopidogrel (PLAVIX) 75 MG tablet Take 1 tablet  (75 mg total) by mouth daily.   fexofenadine (ALLEGRA) 60 MG tablet Take 60 mg by mouth daily.   ipratropium (ATROVENT) 0.06 % nasal spray PLACE 2 SPRAYS INTO BOTH NOSTRILS 2 (TWO) TIMES DAILY.   mirtazapine (REMERON) 30 MG tablet TAKE 1 TABLET BY MOUTH AT BEDTIME   Multiple Vitamin (MULTIVITAMIN WITH MINERALS) TABS tablet Take 1 tablet by mouth daily.   pantoprazole (PROTONIX) 40 MG tablet TAKE 1 TABLET BY MOUTH TWICE A DAY   sertraline (ZOLOFT)  100 MG tablet TAKE 1 TABLET (100 MG TOTAL) BY MOUTH AT BEDTIME.   simvastatin (ZOCOR) 40 MG tablet TAKE 1 TABLET (40 MG TOTAL) BY MOUTH DAILY AT 6 PM.   traZODone (DESYREL) 50 MG tablet TAKE 1 TABLET BY MOUTH EVERYDAY AT BEDTIME   [DISCONTINUED] Cyanocobalamin (VITAMIN B-12 PO) Take 1 tablet by mouth daily.   No facility-administered encounter medications on file as of 04/13/2019.     Activities of Daily Living In your present state of health, do you have any difficulty performing the following activities: 04/13/2019 01/26/2019  Hearing? N N  Comment declines hearing aids -  Vision? Y N  Difficulty concentrating or making decisions? N N  Walking or climbing stairs? N Y  Dressing or bathing? N Y  Doing errands, shopping? N Y  Conservation officer, nature and eating ? N -  Using the Toilet? N -  In the past six months, have you accidently leaked urine? N -  Do you have problems with loss of bowel control? N -  Managing your Medications? N -  Managing your Finances? N -  Housekeeping or managing your Housekeeping? N -  Some recent data might be hidden    Patient Care Team: Glean Hess, MD as PCP - General (Internal Medicine) Garrel Ridgel, DPM as Consulting Physician (Podiatry)    Assessment:   This is a routine wellness examination for [place].  Exercise Activities and Dietary recommendations Current Exercise Habits: The patient does not participate in regular exercise at present, Exercise limited by: orthopedic condition(s)  Goals      Increase water intake     Recommend drinking at least 6-8 glasses of water a day     Prevent falls     Recommend to remove any items from the home that may cause slips or trips.       Fall Risk Fall Risk  04/13/2019 01/26/2019 07/22/2018 01/20/2018 01/14/2018  Falls in the past year? 0 0 0 No No  Number falls in past yr: 0 0 0 - -  Injury with Fall? 0 0 0 - -  Risk Factor Category  - - - - -  Risk for fall due to : Impaired balance/gait;Impaired mobility;Impaired vision History of fall(s);Impaired balance/gait;Impaired mobility History of fall(s);Impaired balance/gait;Impaired mobility Impaired vision;History of fall(s);Impaired balance/gait -  Risk for fall due to: Comment - - - wears eyeglasses; ambulates with cane or rolling walker -  Follow up Falls prevention discussed Falls evaluation completed;Falls prevention discussed - - -   FALL RISK PREVENTION PERTAINING TO THE HOME:  Any stairs in or around the home? Yes  If so, do they handrails? No  outside steps coming in  Home free of loose throw rugs in walkways, pet beds, electrical cords, etc? Yes  Adequate lighting in your home to reduce risk of falls? Yes   ASSISTIVE DEVICES UTILIZED TO PREVENT FALLS:  Life alert? No  Use of a cane, walker or w/c? Yes  Grab bars in the bathroom? Yes  Shower chair or bench in shower? Yes  Elevated toilet seat or a handicapped toilet? Yes   DME ORDERS:  DME order needed?  No   TIMED UP AND GO:  Was the test performed? No . Telephonic visit.   Education: Fall risk prevention has been discussed.  Intervention(s) required? No   Depression Screen PHQ 2/9 Scores 04/13/2019 01/26/2019 07/22/2018 01/20/2018  PHQ - 2 Score 0 2 2 0  PHQ- 9 Score - 9 8 0  Cognitive Function     6CIT Screen 04/13/2019 01/20/2018 01/14/2017  What Year? 0 points 0 points 0 points  What month? 0 points 0 points 0 points  What time? 0 points 0 points 0 points  Count back from 20 0 points 0 points 0 points    Months in reverse 4 points 0 points 0 points  Repeat phrase 2 points 4 points 4 points  Total Score 6 4 4      There is no immunization history on file for this patient.  Qualifies for Shingles Vaccine? Yes   Due for Shingrix. Education has been provided regarding the importance of this vaccine. Pt has been advised to call insurance company to determine out of pocket expense. Advised may also receive vaccine at local pharmacy or Health Dept. Verbalized acceptance and understanding.  Tdap: Although this vaccine is not a covered service during a Wellness Exam, does the patient still wish to receive this vaccine today?  No .  Education has been provided regarding the importance of this vaccine. Advised may receive this vaccine at local pharmacy or Health Dept. Aware to provide a copy of the vaccination record if obtained from local pharmacy or Health Dept. Verbalized acceptance and understanding.  Flu Vaccine: Due for Flu vaccine. Does the patient want to receive this vaccine today?  No . Education has been provided regarding the importance of this vaccine but still declined. Advised may receive this vaccine at local pharmacy or Health Dept. Aware to provide a copy of the vaccination record if obtained from local pharmacy or Health Dept. Verbalized acceptance and understanding.  Pneumococcal Vaccine: Due for Pneumococcal vaccine. Does the patient want to receive this vaccine today?  No . Education has been provided regarding the importance of this vaccine but still declined. Advised may receive this vaccine at local pharmacy or Health Dept. Aware to provide a copy of the vaccination record if obtained from local pharmacy or Health Dept. Verbalized acceptance and understanding.   Screening Tests Health Maintenance  Topic Date Due   INFLUENZA VACCINE  06/05/2023 (Originally 01/03/2019)   TETANUS/TDAP  11/30/2024   PNA vac Low Risk Adult  Discontinued   Cancer Screenings:  Colorectal  Screening: No longer required.   Mammogram: Completed 03/05/12. No longer required.   Bone Density: Completed 2010. Results reflect  OSTEOPOROSIS. No longer required.   Lung Cancer Screening: (Low Dose CT Chest recommended if Age 86-80 years, 30 pack-year currently smoking OR have quit w/in 15years.) does not qualify.   Additional Screening:  Hepatitis C Screening: no longer required  Vision Screening: Recommended annual ophthalmology exams for early detection of glaucoma and other disorders of the eye. Is the patient up to date with their annual eye exam?  No  Who is the provider or what is the name of the office in which the pt attends annual eye exams? [facility] Screening: Recommended annual dental exams for proper oral hygiene  Community Resource Referral:  CRR required this visit?  No '     Plan:    I have personally reviewed and addressed the Medicare Annual Wellness questionnaire and have noted the following in the patients chart:  A. Medical and social history B. Use of alcohol, tobacco or illicit drugs  C. Current medications and supplements D. Functional ability and status E.  Nutritional status F.  Physical activity G. Advance directives H. List of other physicians I.  Hospitalizations, surgeries, and ER visits in previous 12 months J.  Vitals  K. Screenings such as hearing and vision if needed, cognitive and depression L. Referrals and appointments   In addition, I have reviewed and discussed with patient certain preventive protocols, quality metrics, and best practice recommendations. A written personalized care plan for preventive services as well as general preventive health recommendations were provided to patient.   Signed,  Clemetine Marker, LPN Nurse Health Advisor   Nurse Notes: none

## 2019-06-25 ENCOUNTER — Other Ambulatory Visit: Payer: Self-pay | Admitting: Internal Medicine

## 2019-06-25 DIAGNOSIS — E785 Hyperlipidemia, unspecified: Secondary | ICD-10-CM

## 2019-07-09 ENCOUNTER — Other Ambulatory Visit: Payer: Self-pay | Admitting: Internal Medicine

## 2019-07-09 DIAGNOSIS — F324 Major depressive disorder, single episode, in partial remission: Secondary | ICD-10-CM

## 2019-07-09 DIAGNOSIS — K2100 Gastro-esophageal reflux disease with esophagitis, without bleeding: Secondary | ICD-10-CM

## 2019-07-12 ENCOUNTER — Other Ambulatory Visit: Payer: Self-pay | Admitting: Internal Medicine

## 2019-07-12 DIAGNOSIS — F324 Major depressive disorder, single episode, in partial remission: Secondary | ICD-10-CM

## 2019-07-12 DIAGNOSIS — I69359 Hemiplegia and hemiparesis following cerebral infarction affecting unspecified side: Secondary | ICD-10-CM

## 2019-07-14 ENCOUNTER — Other Ambulatory Visit: Payer: Self-pay | Admitting: Internal Medicine

## 2019-07-14 DIAGNOSIS — F411 Generalized anxiety disorder: Secondary | ICD-10-CM

## 2019-07-29 ENCOUNTER — Ambulatory Visit (INDEPENDENT_AMBULATORY_CARE_PROVIDER_SITE_OTHER): Payer: PPO | Admitting: Internal Medicine

## 2019-07-29 ENCOUNTER — Encounter: Payer: Self-pay | Admitting: Internal Medicine

## 2019-07-29 ENCOUNTER — Other Ambulatory Visit
Admission: RE | Admit: 2019-07-29 | Discharge: 2019-07-29 | Disposition: A | Discharge status: Home / Self Care | Payer: PPO | Attending: Internal Medicine | Admitting: Internal Medicine

## 2019-07-29 ENCOUNTER — Other Ambulatory Visit: Payer: Self-pay

## 2019-07-29 VITALS — BP 124/70 | HR 83 | Ht 62.0 in | Wt 125.0 lb

## 2019-07-29 DIAGNOSIS — I69359 Hemiplegia and hemiparesis following cerebral infarction affecting unspecified side: Secondary | ICD-10-CM | POA: Diagnosis not present

## 2019-07-29 DIAGNOSIS — F411 Generalized anxiety disorder: Secondary | ICD-10-CM

## 2019-07-29 DIAGNOSIS — Z8781 Personal history of (healed) traumatic fracture: Secondary | ICD-10-CM | POA: Diagnosis not present

## 2019-07-29 DIAGNOSIS — K2100 Gastro-esophageal reflux disease with esophagitis, without bleeding: Secondary | ICD-10-CM | POA: Insufficient documentation

## 2019-07-29 DIAGNOSIS — Z0000 Encounter for general adult medical examination without abnormal findings: Secondary | ICD-10-CM

## 2019-07-29 DIAGNOSIS — F324 Major depressive disorder, single episode, in partial remission: Secondary | ICD-10-CM | POA: Diagnosis not present

## 2019-07-29 DIAGNOSIS — E785 Hyperlipidemia, unspecified: Secondary | ICD-10-CM | POA: Diagnosis not present

## 2019-07-29 LAB — POCT URINALYSIS DIPSTICK
Bilirubin, UA: NEGATIVE
Blood, UA: NEGATIVE
Glucose, UA: NEGATIVE
Ketones, UA: NEGATIVE
Leukocytes, UA: NEGATIVE
Nitrite, UA: NEGATIVE
Protein, UA: NEGATIVE
Spec Grav, UA: 1.01 (ref 1.010–1.025)
Urobilinogen, UA: 0.2 E.U./dL
pH, UA: 5 (ref 5.0–8.0)

## 2019-07-29 LAB — COMPREHENSIVE METABOLIC PANEL
ALT: 22 U/L (ref 0–44)
AST: 27 U/L (ref 15–41)
Albumin: 4.4 g/dL (ref 3.5–5.0)
Alkaline Phosphatase: 49 U/L (ref 38–126)
Anion gap: 10 (ref 5–15)
BUN: 15 mg/dL (ref 8–23)
CO2: 28 mmol/L (ref 22–32)
Calcium: 9.4 mg/dL (ref 8.9–10.3)
Chloride: 99 mmol/L (ref 98–111)
Creatinine, Ser: 0.44 mg/dL (ref 0.44–1.00)
GFR calc Af Amer: >60 mL/min (ref >60)
GFR calc non Af Amer: >60 mL/min (ref >60)
Glucose, Bld: 93 mg/dL (ref 70–99)
Potassium: 4.5 mmol/L (ref 3.5–5.1)
Sodium: 137 mmol/L (ref 135–145)
Total Bilirubin: 0.6 mg/dL (ref 0.3–1.2)
Total Protein: 7.8 g/dL (ref 6.5–8.1)

## 2019-07-29 LAB — CBC WITH DIFFERENTIAL/PLATELET
Abs Immature Granulocytes: 0.03 10*3/uL (ref 0.00–0.07)
Basophils Absolute: 0.1 10*3/uL (ref 0.0–0.1)
Basophils Relative: 1 %
Eosinophils Absolute: 0.2 10*3/uL (ref 0.0–0.5)
Eosinophils Relative: 2 %
HCT: 40.9 % (ref 36.0–46.0)
Hemoglobin: 13.5 g/dL (ref 12.0–15.0)
Immature Granulocytes: 0 %
Lymphocytes Relative: 26 %
Lymphs Abs: 2.1 10*3/uL (ref 0.7–4.0)
MCH: 34.5 pg — ABNORMAL HIGH (ref 26.0–34.0)
MCHC: 33 g/dL (ref 30.0–36.0)
MCV: 104.6 fL — ABNORMAL HIGH (ref 80.0–100.0)
Monocytes Absolute: 0.5 10*3/uL (ref 0.1–1.0)
Monocytes Relative: 7 %
Neutro Abs: 5 10*3/uL (ref 1.7–7.7)
Neutrophils Relative %: 64 %
Platelets: 235 10*3/uL (ref 150–400)
RBC: 3.91 MIL/uL (ref 3.87–5.11)
RDW: 11.8 % (ref 11.5–15.5)
WBC: 7.9 10*3/uL (ref 4.0–10.5)
nRBC: 0 % (ref 0.0–0.2)

## 2019-07-29 LAB — LIPID PANEL
Cholesterol: 226 mg/dL — ABNORMAL HIGH (ref 0–200)
HDL: 87 mg/dL (ref >40)
LDL Cholesterol: 113 mg/dL — ABNORMAL HIGH (ref 0–99)
Total CHOL/HDL Ratio: 2.6 RATIO
Triglycerides: 130 mg/dL (ref <150)
VLDL: 26 mg/dL (ref 0–40)

## 2019-07-29 LAB — VITAMIN D 25 HYDROXY (VIT D DEFICIENCY, FRACTURES): Vit D, 25-Hydroxy: 57.15 ng/mL (ref 30–100)

## 2019-07-29 LAB — TSH: TSH: 1.445 u[IU]/mL (ref 0.350–4.500)

## 2019-07-29 MED ORDER — MIRTAZAPINE 30 MG PO TABS: 30.0000 mg | ORAL_TABLET | Freq: Every day | ORAL | 3 refills | Status: DC

## 2019-07-29 NOTE — Patient Instructions (Signed)
Take a vitamin D 400 IU a day.
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
## 2019-12-15 ENCOUNTER — Other Ambulatory Visit: Payer: Self-pay | Admitting: Internal Medicine

## 2019-12-15 DIAGNOSIS — E785 Hyperlipidemia, unspecified: Secondary | ICD-10-CM

## 2019-12-15 NOTE — Telephone Encounter (Signed)
Requested Prescriptions  Pending Prescriptions Disp Refills   simvastatin (ZOCOR) 40 MG tablet [Pharmacy Med Name: SIMVASTATIN 40 MG TABLET] 90 tablet 1    Sig: TAKE 1 TABLET (40 MG TOTAL) BY MOUTH DAILY AT 6 PM.     Cardiovascular:  Antilipid - Statins Failed - 12/15/2019  1:41 AM      Failed - Total Cholesterol in normal range and within 360 days    Cholesterol, Total  Date Value Ref Range Status  07/22/2018 223 (H) 100 - 199 mg/dL Final   Cholesterol  Date Value Ref Range Status  07/29/2019 226 (H) 0 - 200 mg/dL Final  02/01/2013 168 0 - 200 mg/dL Final         Failed - LDL in normal range and within 360 days    Ldl Cholesterol, Calc  Date Value Ref Range Status  02/01/2013 87 0 - 100 mg/dL Final   LDL Calculated  Date Value Ref Range Status  07/22/2018 96 0 - 99 mg/dL Final   LDL Cholesterol  Date Value Ref Range Status  07/29/2019 113 (H) 0 - 99 mg/dL Final    Comment:           Total Cholesterol/HDL:CHD Risk Coronary Heart Disease Risk Table                     Men   Women  1/2 Average Risk   3.4   3.3  Average Risk       5.0   4.4  2 X Average Risk   9.6   7.1  3 X Average Risk  23.4   11.0        Use the calculated Patient Ratio above and the CHD Risk Table to determine the patient's CHD Risk.        ATP III CLASSIFICATION (LDL):  <100     mg/dL   Optimal  100-129  mg/dL   Near or Above                    Optimal  130-159  mg/dL   Borderline  160-189  mg/dL   High  >190     mg/dL   Very High Performed at [facility], [address] [place], [place] [address]          Passed - HDL in normal range and within 360 days    HDL Cholesterol  Date Value Ref Range Status  02/01/2013 67 (H) 40 - 60 mg/dL Final   HDL  Date Value Ref Range Status  07/29/2019 87 >40 mg/dL Final  07/22/2018 103 >39 mg/dL Final         Passed - Triglycerides in normal range and within 360 days    Triglycerides  Date Value Ref Range Status  07/29/2019 130 <150  mg/dL Final  02/01/2013 70 0 - 200 mg/dL Final         Passed - Patient is not pregnant      Passed - Valid encounter within last 12 months    Recent Outpatient Visits          4 months ago Annual physical exam   [facility] Glean Hess, MD   10 months ago Depression, major, single episode, in partial remission [facility])   [facility] Glean Hess, MD   1 year ago Annual physical exam   [facility] Glean Hess, MD   1 year ago Bronchitis  [facility] Glean Hess, MD   1 year ago Gastroesophageal reflux disease with esophagitis   [facility] Glean Hess, MD      Future Appointments            In 1 month Army Melia Jesse Sans, MD [facility], [place]   In 7 months Army Melia Jesse Sans, MD [facility], [facility]

## 2019-12-30 ENCOUNTER — Other Ambulatory Visit: Payer: Self-pay | Admitting: Internal Medicine

## 2019-12-30 DIAGNOSIS — F324 Major depressive disorder, single episode, in partial remission: Secondary | ICD-10-CM

## 2019-12-30 NOTE — Telephone Encounter (Signed)
Requested Prescriptions  Pending Prescriptions Disp Refills  . sertraline (ZOLOFT) 100 MG tablet [Pharmacy Med Name: SERTRALINE HCL 100 MG TABLET] 90 tablet 0    Sig: TAKE 1 TABLET (100 MG TOTAL) BY MOUTH AT BEDTIME.     Psychiatry:  Antidepressants - SSRI Passed - 12/30/2019  2:16 AM      Passed - Completed PHQ-2 or PHQ-9 in the last 360 days.      Passed - Valid encounter within last 6 months    Recent Outpatient Visits          5 months ago Annual physical exam   [facility] Glean Hess, MD   11 months ago Depression, major, single episode, in partial remission [facility])   [facility] Glean Hess, MD   1 year ago Annual physical exam   [facility] Glean Hess, MD   1 year ago [facility] Glean Hess, MD   1 year ago Gastroesophageal reflux disease with esophagitis   [facility] Glean Hess, MD      Future Appointments            In 2 weeks Army Melia Jesse Sans, MD [facility], [place]   In 7 months Army Melia Jesse Sans, MD [facility], [facility]

## 2020-01-01 ENCOUNTER — Other Ambulatory Visit: Payer: Self-pay | Admitting: Internal Medicine

## 2020-01-01 DIAGNOSIS — K2100 Gastro-esophageal reflux disease with esophagitis, without bleeding: Secondary | ICD-10-CM

## 2020-01-10 ENCOUNTER — Other Ambulatory Visit: Payer: Self-pay | Admitting: Internal Medicine

## 2020-01-10 DIAGNOSIS — F411 Generalized anxiety disorder: Secondary | ICD-10-CM

## 2020-01-10 NOTE — Telephone Encounter (Signed)
Requested medication (s) are due for refill today: yes  Requested medication (s) are on the active medication list: expired med  Last refill:  07/14/19  Future visit scheduled: yes  Notes to clinic:  prescription expired 08/13/19   Requested Prescriptions  Pending Prescriptions Disp Refills   ALPRAZolam (XANAX) 0.25 MG tablet [Pharmacy Med Name: ALPRAZOLAM 0.25 MG TABLET] 30 tablet     Sig: TAKE 1 TABLET (0.25 MG TOTAL) BY MOUTH AT BEDTIME AS NEEDED FOR ANXIETY.      Not Delegated - Psychiatry:  Anxiolytics/Hypnotics Failed - 01/10/2020  2:42 PM      Failed - This refill cannot be delegated      Failed - Urine Drug Screen completed in last 360 days.      Passed - Valid encounter within last 6 months    Recent Outpatient Visits           5 months ago Annual physical exam   [facility] Glean Hess, MD   11 months ago Depression, major, single episode, in partial remission [facility])   [facility] Glean Hess, MD   1 year ago Annual physical exam   [facility] Glean Hess, MD   1 year ago [facility] Glean Hess, MD   1 year ago Gastroesophageal reflux disease with esophagitis   [facility] Glean Hess, MD       Future Appointments             In 1 week Army Melia Jesse Sans, MD [facility], [place]   In 6 months Army Melia Jesse Sans, MD [facility], [facility]

## 2020-01-11 NOTE — Telephone Encounter (Signed)
Please Advise. Not on current medication list. But is in history of medications. Last office visit 07/29/2019.  KP

## 2020-01-19 ENCOUNTER — Ambulatory Visit (INDEPENDENT_AMBULATORY_CARE_PROVIDER_SITE_OTHER): Payer: PPO | Admitting: Internal Medicine

## 2020-01-19 ENCOUNTER — Encounter: Payer: Self-pay | Admitting: Internal Medicine

## 2020-01-19 ENCOUNTER — Other Ambulatory Visit: Payer: Self-pay

## 2020-01-19 VITALS — BP 128/70 | HR 87 | Temp 98.0°F | Ht 62.0 in | Wt 128.0 lb

## 2020-01-19 DIAGNOSIS — D6869 Other thrombophilia: Secondary | ICD-10-CM

## 2020-01-19 DIAGNOSIS — I69359 Hemiplegia and hemiparesis following cerebral infarction affecting unspecified side: Secondary | ICD-10-CM | POA: Diagnosis not present

## 2020-01-19 DIAGNOSIS — K2100 Gastro-esophageal reflux disease with esophagitis, without bleeding: Secondary | ICD-10-CM

## 2020-01-19 DIAGNOSIS — F324 Major depressive disorder, single episode, in partial remission: Secondary | ICD-10-CM

## 2020-01-19 DIAGNOSIS — E785 Hyperlipidemia, unspecified: Secondary | ICD-10-CM
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
## 2020-01-24 ENCOUNTER — Other Ambulatory Visit: Payer: Self-pay | Admitting: Internal Medicine

## 2020-01-24 DIAGNOSIS — I69359 Hemiplegia and hemiparesis following cerebral infarction affecting unspecified side: Secondary | ICD-10-CM

## 2020-01-24 NOTE — Telephone Encounter (Signed)
Requested Prescriptions  Pending Prescriptions Disp Refills   clopidogrel (PLAVIX) 75 MG tablet [Pharmacy Med Name: CLOPIDOGREL 75 MG TABLET] 90 tablet 1    Sig: TAKE 1 TABLET BY MOUTH EVERY DAY     Hematology: Antiplatelets - clopidogrel Failed - 01/24/2020 12:51 AM      Failed - Evaluate AST, ALT within 2 months of therapy initiation.      Passed - ALT in normal range and within 360 days    ALT  Date Value Ref Range Status  07/29/2019 22 0 - 44 U/L Final   SGPT (ALT)  Date Value Ref Range Status  02/01/2013 21 12 - 78 U/L Final         Passed - AST in normal range and within 360 days    AST  Date Value Ref Range Status  07/29/2019 27 15 - 41 U/L Final   SGOT(AST)  Date Value Ref Range Status  02/01/2013 38 (H) 15 - 37 Unit/L Final         Passed - HCT in normal range and within 180 days    HCT  Date Value Ref Range Status  07/29/2019 40.9 36 - 46 % Final   Hematocrit  Date Value Ref Range Status  07/22/2018 40.0 34.0 - 46.6 % Final         Passed - HGB in normal range and within 180 days    Hemoglobin  Date Value Ref Range Status  07/29/2019 13.5 12.0 - 15.0 g/dL Final  07/22/2018 13.5 11.1 - 15.9 g/dL Final         Passed - PLT in normal range and within 180 days    Platelets  Date Value Ref Range Status  07/29/2019 235 150 - 400 K/uL Final  07/22/2018 222 150 - 450 x10E3/uL Final         Passed - Valid encounter within last 6 months    Recent Outpatient Visits          5 days ago Depression, major, single episode, in partial remission [facility])   [facility] Glean [facility], [facility]   5 months ago Annual physical exam   [facility] Glean Hess, MD   12 months ago Depression, major, single episode, in partial remission [facility])   [facility] Glean [facility], [facility]   1 year ago Annual physical exam   [facility] Glean Hess, MD   1 year ago [facility] Glean Hess, MD       Future Appointments            In 6 months Army Melia Jesse Sans, MD [facility], [facility]

## 2020-02-09 ENCOUNTER — Other Ambulatory Visit: Payer: Self-pay | Admitting: Internal Medicine

## 2020-02-09 DIAGNOSIS — F411 Generalized anxiety disorder: Secondary | ICD-10-CM

## 2020-02-09 NOTE — Telephone Encounter (Signed)
Requested medication (s) are due for refill today: yes  Requested medication (s) are on the active medication list: yes  Last refill:  01/11/20# 30  Future visit scheduled: yes  Notes to clinic:  Please review for refill. Refill not delegated per protocol    Requested Prescriptions  Pending Prescriptions Disp Refills   ALPRAZolam (XANAX) 0.25 MG tablet [Pharmacy Med Name: ALPRAZOLAM 0.25 MG TABLET] 30 tablet     Sig: TAKE 1 TABLET (0.25 MG TOTAL) BY MOUTH AT BEDTIME AS NEEDED FOR ANXIETY.      Not Delegated - Psychiatry:  Anxiolytics/Hypnotics Failed - 02/09/2020  1:17 PM      Failed - This refill cannot be delegated      Failed - Urine Drug Screen completed in last 360 days.      Passed - Valid encounter within last 6 months    Recent Outpatient Visits           3 weeks ago Depression, major, single episode, in partial remission [facility])   [facility], [facility]   6 months ago Annual physical exam   [facility] Glean [facility], MD   1 year ago Depression, major, single episode, in partial remission [facility])   [facility] Glean Hess, MD   1 year ago Annual physical exam   [facility] Glean Hess, MD   2 years ago [facility] Glean Hess, MD       Future Appointments             In 5 months Army Melia Jesse Sans, MD [facility], [facility]

## 2020-02-10 NOTE — Telephone Encounter (Signed)
Please Advise. Last office visit 01/19/2020.  KP

## 2020-02-17 IMAGING — CR DG CHEST 2V
3 series · 3 of 3 positions shown · non-contrast
Comparison: 05/17/2016 and 04/21/2016 CXR

CLINICAL DATA: Dyspnea and chest pain for 4 days.

EXAM:
CHEST - 2 VIEW

[chest pa (1 of 2)]
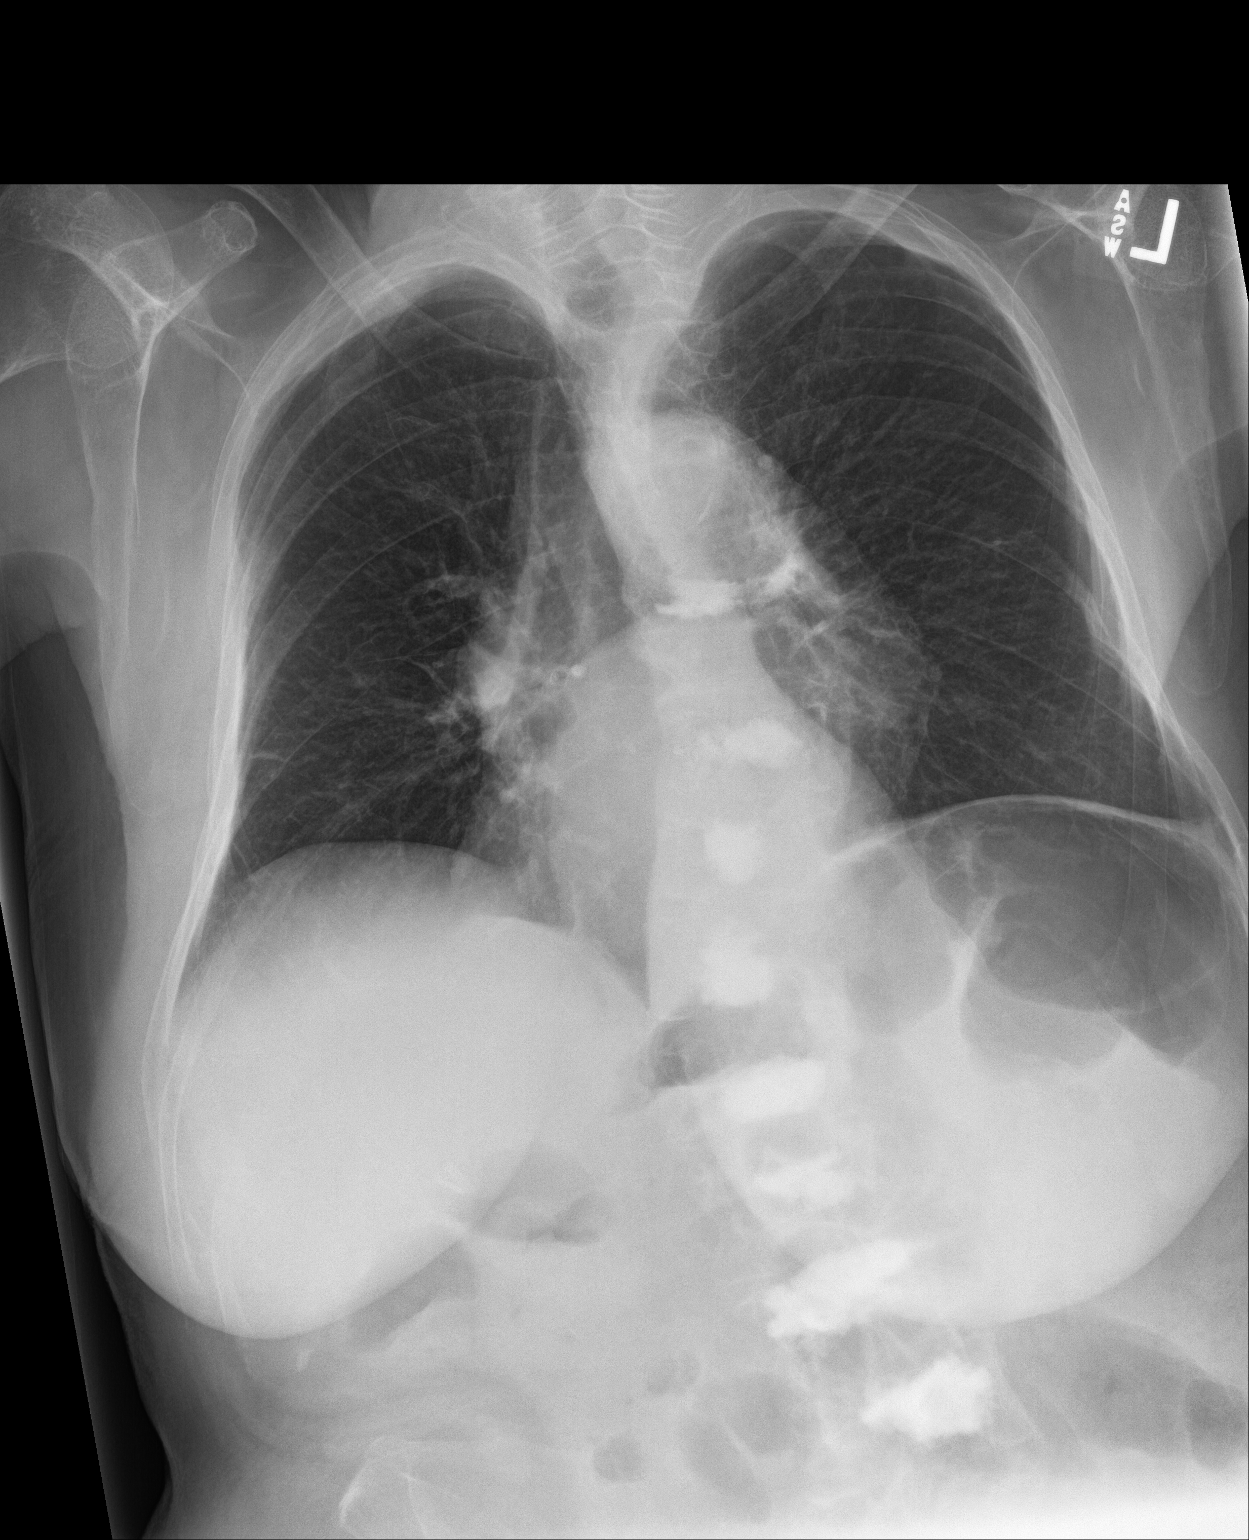

[chest lat]
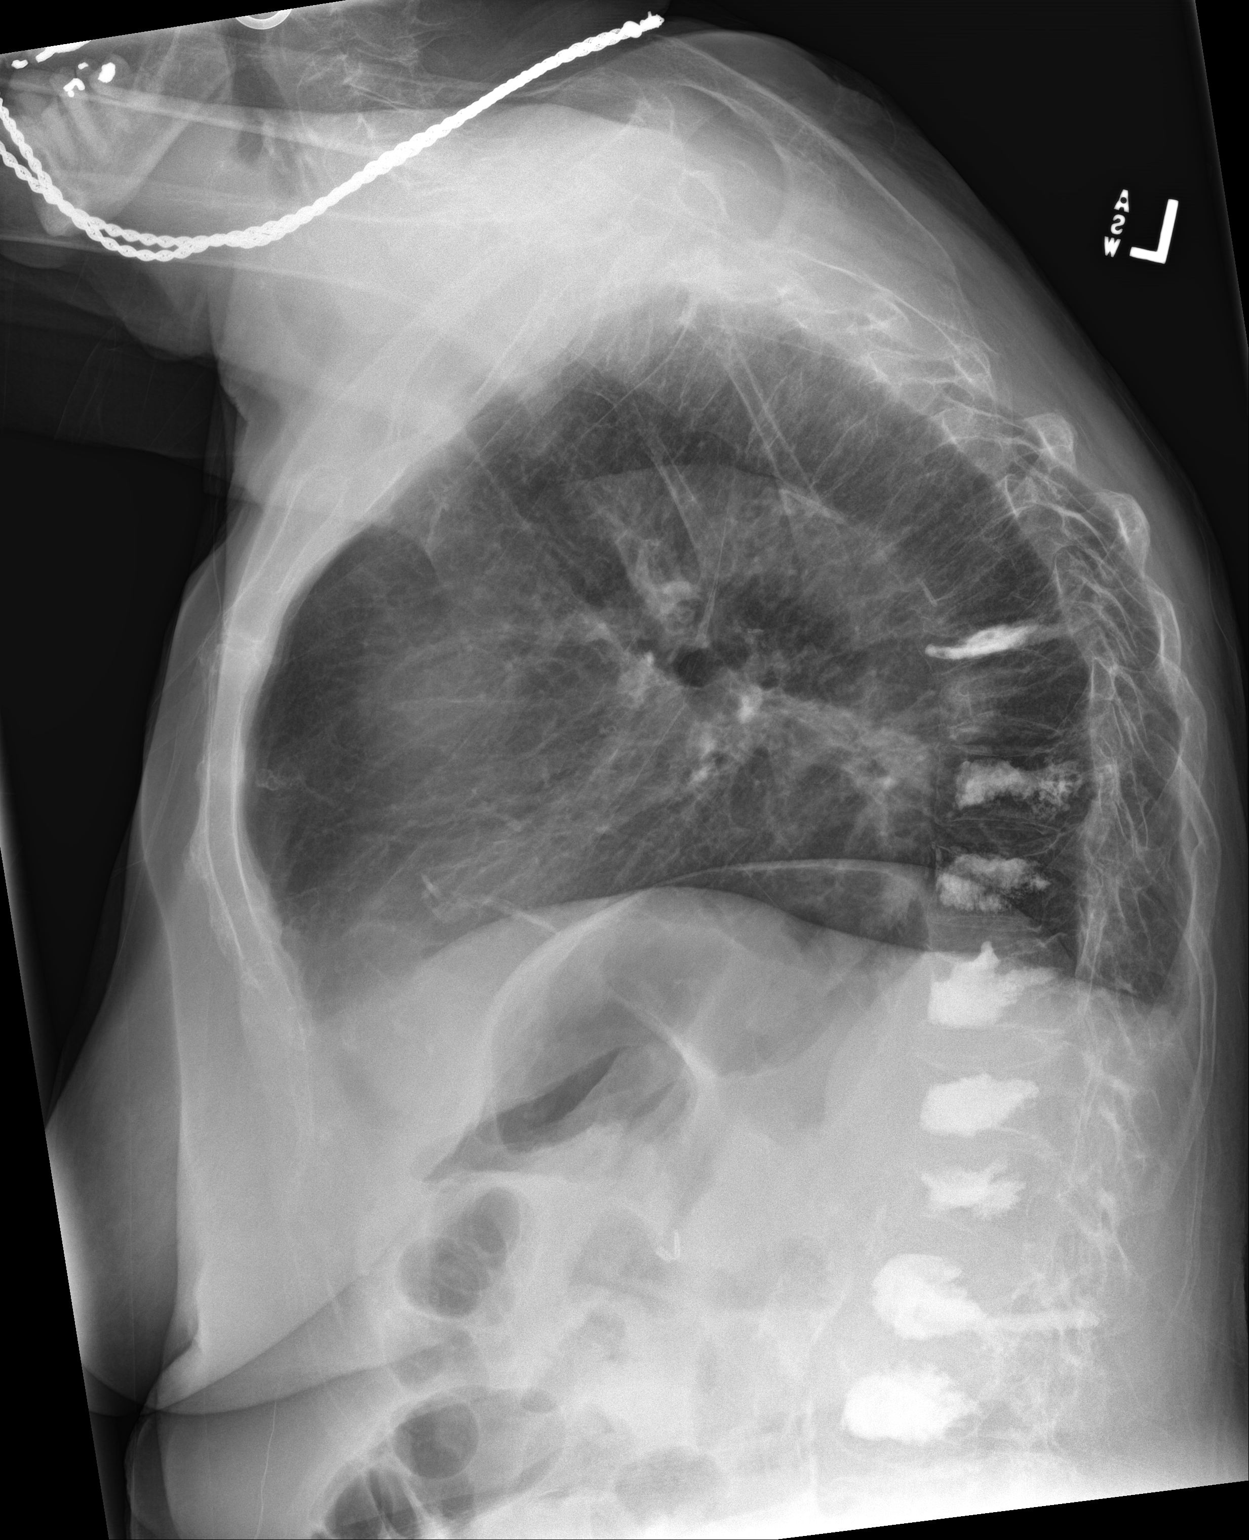

[chest pa (2 of 2)]
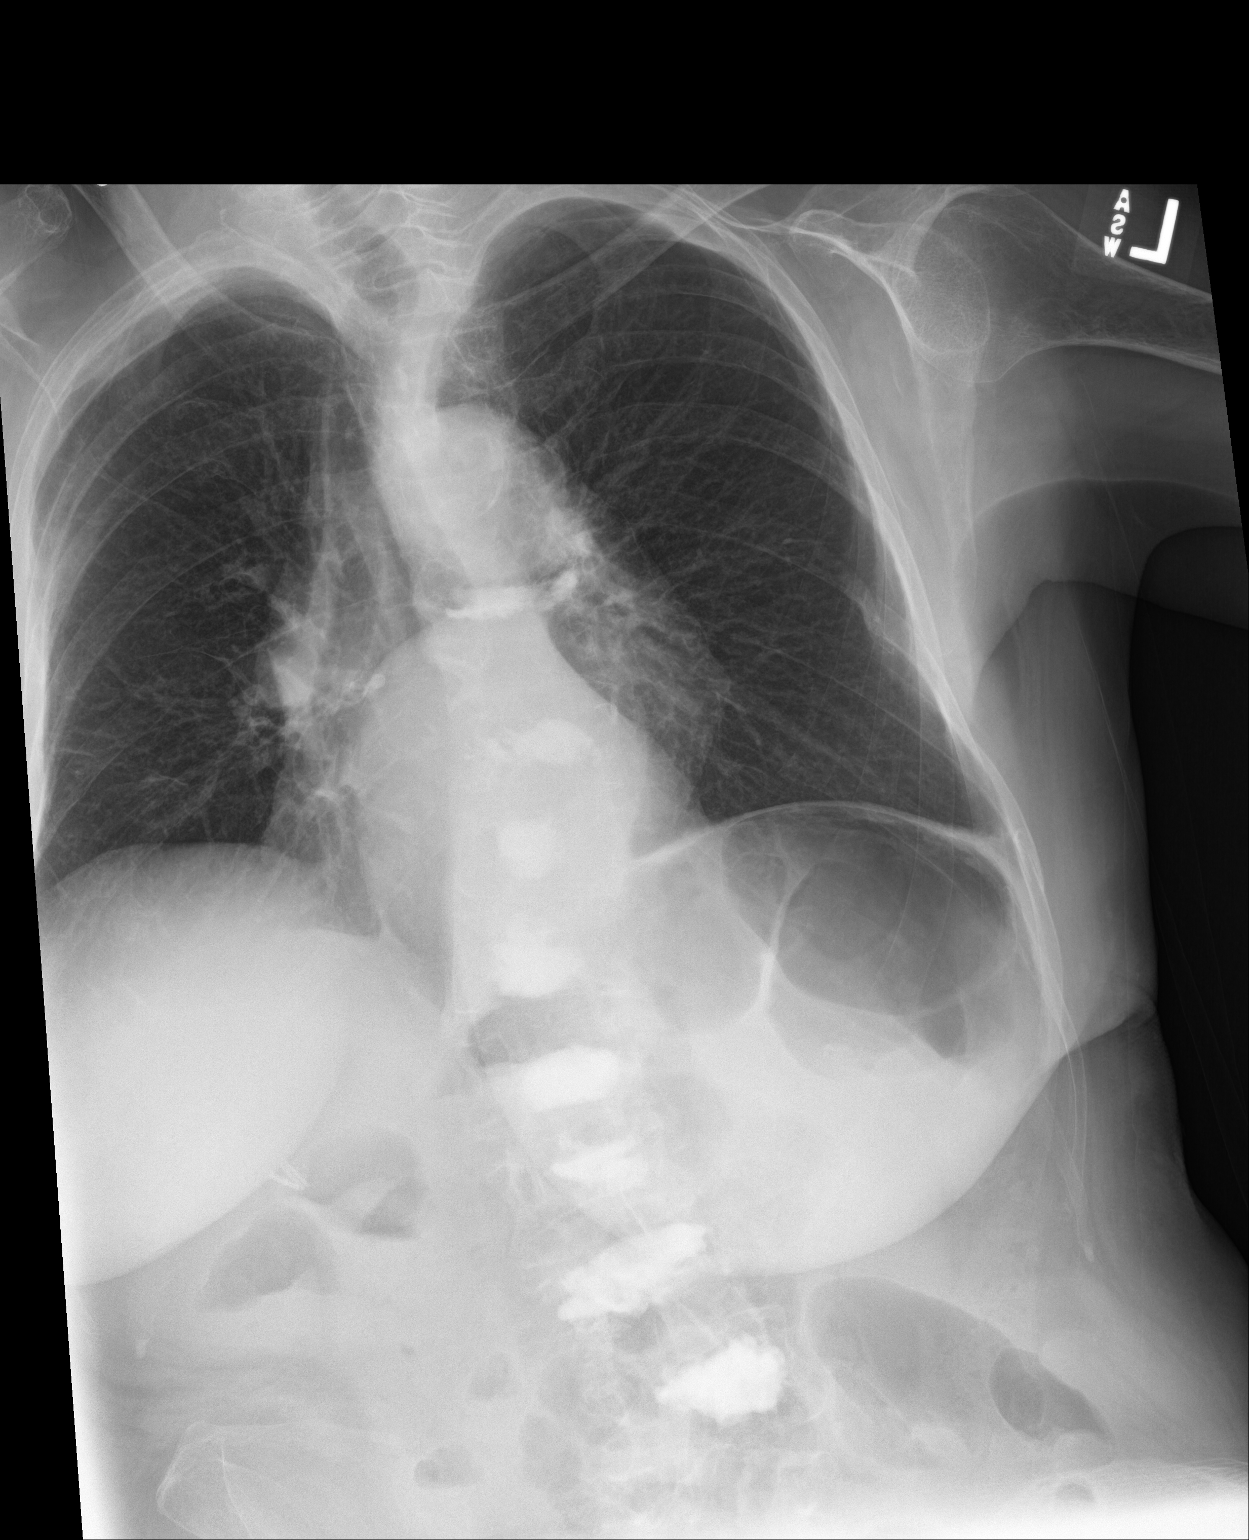

[3 of 3 positions shown; findings below may reference images not displayed]

FINDINGS: Redemonstration of chronic stable vertebral augmentation at multiple
levels of the included thoracic and upper lumbar spine. Scoliotic
curvature remain stable convex to the left in the midthoracic spine
and to the right in the upper lumbar spine. Lungs are clear. Remote
left-sided rib fracture, possibly the seventh or eighth rib though
difficult to number due to overlap. Tortuous atherosclerotic aorta.
No top-normal size heart..
IMPRESSION: No active cardiopulmonary disease. Aortic atherosclerosis.
Multilevel vertebral augmentation and remote left mid rib fracture.

## 2020-03-06 ENCOUNTER — Other Ambulatory Visit: Payer: Self-pay | Admitting: Internal Medicine

## 2020-03-06 DIAGNOSIS — F324 Major depressive disorder, single episode, in partial remission: Secondary | ICD-10-CM

## 2020-03-06 NOTE — Telephone Encounter (Signed)
Requested Prescriptions  Pending Prescriptions Disp Refills   traZODone (DESYREL) 50 MG tablet [Pharmacy Med Name: TRAZODONE 50 MG TABLET] 90 tablet 1    Sig: TAKE 1 TABLET BY MOUTH EVERYDAY AT BEDTIME     Psychiatry: Antidepressants - Serotonin Modulator Passed - 03/06/2020  4:23 AM      Passed - Completed PHQ-2 or PHQ-9 in the last 360 days.      Passed - Valid encounter within last 6 months    Recent Outpatient Visits          1 month ago Depression, major, single episode, in partial remission [facility])   [facility] Glean [facility], [facility]   7 months ago Annual physical exam   [facility], [facility]   1 year ago Depression, major, single episode, in partial remission [facility])   [facility] Glean [facility], MD   1 year ago Annual physical exam   [facility], [facility]   2 years ago [facility] Glean Hess, MD      Future Appointments            In 4 months Army Melia Jesse Sans, MD [facility], [facility]

## 2020-03-08 ENCOUNTER — Other Ambulatory Visit: Payer: Self-pay | Admitting: Internal Medicine

## 2020-03-08 NOTE — Telephone Encounter (Signed)
Requested Prescriptions  Pending Prescriptions Disp Refills  . PROAIR HFA 108 (90 Base) MCG/ACT inhaler [Pharmacy Med Name: PROAIR HFA 90 MCG INHALER] 8.5 each     Sig: TAKE 2 PUFFS BY MOUTH EVERY 6 HOURS AS NEEDED FOR WHEEZE OR SHORTNESS OF BREATH     Pulmonology:  Beta Agonists Failed - 03/08/2020  1:55 PM      Failed - One inhaler should last at least one month. If the patient is requesting refills earlier, contact the patient to check for uncontrolled symptoms.      Passed - Valid encounter within last 12 months    Recent Outpatient Visits          1 month ago Depression, major, single episode, in partial remission [facility])   [facility], [facility]   7 months ago Annual physical exam   [facility] Glean [facility], MD   1 year ago Depression, major, single episode, in partial remission [facility])   [facility] Glean Hess, MD   1 year ago Annual physical exam   [facility] Gle[facility], [facility]   2 years ago [facility] Glean Hess, MD      Future Appointments            In 4 months Army Melia Jesse Sans, MD [facility], [facility]

## 2020-03-24 ENCOUNTER — Other Ambulatory Visit: Payer: Self-pay | Admitting: Internal Medicine

## 2020-03-24 DIAGNOSIS — F324 Major depressive disorder, single episode, in partial remission: Secondary | ICD-10-CM

## 2020-03-27 ENCOUNTER — Other Ambulatory Visit: Payer: Self-pay | Admitting: Internal Medicine

## 2020-03-27 NOTE — Telephone Encounter (Signed)
Requested Prescriptions  Pending Prescriptions Disp Refills  . albuterol (VENTOLIN HFA) 108 (90 Base) MCG/ACT inhaler [Pharmacy Med Name: ALBUTEROL HFA (PROAIR) INHALER] 8.5 each 0    Sig: TAKE 2 PUFFS BY MOUTH EVERY 6 HOURS AS NEEDED FOR WHEEZE OR SHORTNESS OF BREATH     Pulmonology:  Beta Agonists Failed - 03/27/2020 12:42 AM      Failed - One inhaler should last at least one month. If the patient is requesting refills earlier, contact the patient to check for uncontrolled symptoms.      Passed - Valid encounter within last 12 months    Recent Outpatient Visits          2 months ago Depression, major, single episode, in partial remission [facility])   [facility]   8 months ago Annual physical exam   [facility], [facility]   1 year ago Depression, major, single episode, in partial remission [facility])   [facility] Gle[facility], MD   1 year ago Annual physical exam   [facility] Gle[facility], MD   2 years ago [facility], [facility]      Future Appointments            In 4 months Army Melia Jesse Sans, MD [facility], [facility]

## 2020-04-01 ENCOUNTER — Other Ambulatory Visit: Payer: Self-pay | Admitting: Internal Medicine

## 2020-04-01 NOTE — Telephone Encounter (Signed)
Requested medication (s) are due for refill today: Yes  Requested medication (s) are on the active medication list: Yes  Last refill:  07/13/19  Future visit scheduled: Yes  Notes to clinic:  See request.    Requested Prescriptions  Pending Prescriptions Disp Refills   ipratropium (ATROVENT) 0.06 % nasal spray [Pharmacy Med Name: IPRATROPIUM 0.06% SPRAY] 15 mL 1    Sig: PLACE 2 SPRAYS INTO BOTH NOSTRILS 2 (TWO) TIMES DAILY.      Off-Protocol Failed - 04/01/2020  1:03 PM      Failed - Medication not assigned to a protocol, review manually.      Passed - Valid encounter within last 12 months    Recent Outpatient Visits           2 months ago Depression, major, single episode, in partial remission [facility])   [facility], [facility]   8 months ago Annual physical exam   [facility] Glean Hess, MD   1 year ago Depression, major, single episode, in partial remission [facility])   [facility] Glean Hess, [facility]   1 year ago Annual physical exam   [facility] Glean Hess, MD   2 years ago [facility] Glean Hess, MD       Future Appointments             In 4 months Army Melia Jesse Sans, MD [facility], [facility]           Off-Protocol Failed - 04/01/2020  1:03 PM      Failed - Medication not assigned to a protocol, review manually.      Passed - Valid encounter within last 12 months    Recent Outpatient Visits           2 months ago Depression, major, single episode, in partial remission [facility])   [facility] Glean [facility], [facility]   8 months ago Annual physical exam   [facility] Glean Hess, MD   1 year ago Depression, major, single episode, in partial remission [facility])   [facility] Gle[facility], MD   1 year ago Annual physical exam   [facility] Glean Hess, MD   2 years ago [facility] Glean Hess, MD       Future Appointments             In 4 months Army Melia Jesse Sans, MD [facility], [facility]

## 2020-04-13 ENCOUNTER — Ambulatory Visit: Payer: PPO

## 2020-04-16 ENCOUNTER — Other Ambulatory Visit: Payer: Self-pay | Admitting: Internal Medicine

## 2020-04-16 DIAGNOSIS — I69359 Hemiplegia and hemiparesis following cerebral infarction affecting unspecified side: Secondary | ICD-10-CM

## 2020-04-16 NOTE — Telephone Encounter (Signed)
Requested Prescriptions  Pending Prescriptions Disp Refills   clopidogrel (PLAVIX) 75 MG tablet [Pharmacy Med Name: CLOPIDOGREL 75 MG TABLET] 90 tablet 0    Sig: TAKE 1 TABLET BY MOUTH EVERY DAY     Hematology: Antiplatelets - clopidogrel Failed - 04/16/2020 12:49 AM      Failed - Evaluate AST, ALT within 2 months of therapy initiation.      Failed - HCT in normal range and within 180 days    HCT  Date Value Ref Range Status  07/29/2019 40.9 36 - 46 % Final   Hematocrit  Date Value Ref Range Status  07/22/2018 40.0 34.0 - 46.6 % Final         Failed - HGB in normal range and within 180 days    Hemoglobin  Date Value Ref Range Status  07/29/2019 13.5 12.0 - 15.0 g/dL Final  07/22/2018 13.5 11.1 - 15.9 g/dL Final         Failed - PLT in normal range and within 180 days    Platelets  Date Value Ref Range Status  07/29/2019 235 150 - 400 K/uL Final  07/22/2018 222 150 - 450 x10E3/uL Final         Passed - ALT in normal range and within 360 days    ALT  Date Value Ref Range Status  07/29/2019 22 0 - 44 U/L Final   SGPT (ALT)  Date Value Ref Range Status  02/01/2013 21 12 - 78 U/L Final         Passed - AST in normal range and within 360 days    AST  Date Value Ref Range Status  07/29/2019 27 15 - 41 U/L Final   SGOT(AST)  Date Value Ref Range Status  02/01/2013 38 (H) 15 - 37 Unit/L Final         Passed - Valid encounter within last 6 months    Recent Outpatient Visits          2 months ago Depression, major, single episode, in partial remission [facility])   [facility] Gle[facility]   8 months ago Annual physical exam   [facility] Glean Hess, MD   1 year ago Depression, major, single episode, in partial remission [facility])   [facility] Gle[facility], [facility]   1 year ago Annual physical exam   [facility] Glean Hess, MD   2 years ago [facility] Glean [facility], MD       Future Appointments            In 3 months Army Melia Jesse Sans, MD [facility], [facility]

## 2020-04-21 ENCOUNTER — Other Ambulatory Visit: Payer: Self-pay | Admitting: Internal Medicine

## 2020-04-26 ENCOUNTER — Ambulatory Visit: Payer: Self-pay
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
## 2020-04-27 ENCOUNTER — Ambulatory Visit (INDEPENDENT_AMBULATORY_CARE_PROVIDER_SITE_OTHER): Payer: PPO | Admitting: Internal Medicine

## 2020-04-27 ENCOUNTER — Other Ambulatory Visit: Payer: Self-pay

## 2020-04-27 ENCOUNTER — Ambulatory Visit
Admission: RE | Admit: 2020-04-27 | Discharge: 2020-04-27 | Disposition: A | Discharge status: Home / Self Care | Payer: PPO | Attending: Internal Medicine | Admitting: Internal Medicine

## 2020-04-27 ENCOUNTER — Encounter: Payer: Self-pay | Admitting: Internal Medicine

## 2020-04-27 ENCOUNTER — Ambulatory Visit
Admission: RE | Admit: 2020-04-27 | Discharge: 2020-04-27 | Disposition: A | Discharge status: Home / Self Care | Payer: PPO | Source: Ambulatory Visit | Attending: Internal Medicine | Admitting: Internal Medicine

## 2020-04-27 VITALS — BP 140/88 | HR 95 | Ht 62.0 in | Wt 123.0 lb

## 2020-04-27 DIAGNOSIS — R0789 Other chest pain: Secondary | ICD-10-CM | POA: Insufficient documentation

## 2020-04-27 DIAGNOSIS — M542 Cervicalgia: Secondary | ICD-10-CM

## 2020-04-27 MED ORDER — BACLOFEN 10 MG PO TABS: 10.0000 mg | ORAL_TABLET | Freq: Three times a day (TID) | ORAL | 0 refills | Status: DC
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
## 2020-04-27 NOTE — Patient Instructions (Addendum)
Take 2 Tylenol three times a day.  Use heat to neck and left shoulder.  Take Baclofen (muscle relaxer) three times a day.

## 2020-05-02 ENCOUNTER — Telehealth: Payer: Self-pay

## 2020-05-02 ENCOUNTER — Ambulatory Visit (INDEPENDENT_AMBULATORY_CARE_PROVIDER_SITE_OTHER): Payer: PPO

## 2020-05-02 DIAGNOSIS — Z0000 Encounter for general adult medical examination without abnormal findings: Secondary | ICD-10-CM
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
## 2020-05-02 NOTE — Telephone Encounter (Unsigned)
Copied from [place] [id number]. Topic: General - Call Back - No Documentation >> Apr 29, 2020  3:14 PM Erick Blinks wrote: Reason for CRM: Pt called her Xray results, please advice if [facility] may disclose  Best contact: [phone]

## 2020-05-02 NOTE — Telephone Encounter (Signed)
Will call patient with results. See result note.

## 2020-05-02 NOTE — Patient Instructions (Signed)
Valerie Hanna , Thank you for taking time to come for your Medicare Wellness Visit. I appreciate your ongoing commitment to your health goals. Please review the following plan we discussed and let me know if I can assist you in the future.   Screening recommendations/referrals: Colonoscopy: no longer required Mammogram: no longer required Bone Density: no longer required Recommended yearly ophthalmology/optometry visit for glaucoma screening and checkup Recommended yearly dental visit for hygiene and checkup  Vaccinations: Influenza vaccine: declined Pneumococcal vaccine: declined Tdap vaccine: due Shingles vaccine: Shingrix discussed. Please contact your pharmacy for coverage information.  Covid-19: declined  Advanced directives: Please bring a copy of your health care power of attorney and living will to the office at your convenience.  Conditions/risks identified: Recommend fall prevention in the home  Next appointment: Follow up in one year for your annual wellness visit    Preventive Care 65 Years and Older, Female Preventive care refers to lifestyle choices and visits with your health care provider that can promote health and wellness. What does preventive care include?  A yearly physical exam. This is also called an annual well check.  Dental exams once or twice a year.  Routine eye exams. Ask your health care provider how often you should have your eyes checked.  Personal lifestyle choices, including:  Daily care of your teeth and gums.  Regular physical activity.  Eating a healthy diet.  Avoiding tobacco and drug use.  Limiting alcohol use.  Practicing safe sex.  Taking low-dose aspirin every day.  Taking vitamin and mineral supplements as recommended by your health care provider. What happens during an annual well check? The services and screenings done by your health care provider during your annual well check will depend on your age, overall health,  lifestyle risk factors, and family history of disease. Counseling  Your health care provider may ask you questions about your:  Alcohol use.  Tobacco use.  Drug use.  Emotional well-being.  Home and relationship well-being.  Sexual activity.  Eating habits.  History of falls.  Memory and ability to understand (cognition).  Work and work Statistician.  Reproductive health. Screening  You may have the following tests or measurements:  Height, weight, and BMI.  Blood pressure.  Lipid and cholesterol levels. These may be checked every 5 years, or more frequently if you are over 52 years old.  Skin check.  Lung cancer screening. You may have this screening every year starting at age 46 if you have a 30-pack-year history of smoking and currently smoke or have quit within the past 15 years.  Fecal occult blood test (FOBT) of the stool. You may have this test every year starting at age 74.  Flexible sigmoidoscopy or colonoscopy. You may have a sigmoidoscopy every 5 years or a colonoscopy every 10 years starting at age 40.  Hepatitis C blood test.  Hepatitis B blood test.  Sexually transmitted disease (STD) testing.  Diabetes screening. This is done by checking your blood sugar (glucose) after you have not eaten for a while (fasting). You may have this done every 1-3 years.  Bone density scan. This is done to screen for osteoporosis. You may have this done starting at age 18.  Mammogram. This may be done every 1-2 years. Talk to your health care provider about how often you should have regular mammograms. Talk with your health care provider about your test results, treatment options, and if necessary, the need for more tests. Vaccines  Your health care provider  may recommend certain vaccines, such as:  Influenza vaccine. This is recommended every year.  Tetanus, diphtheria, and acellular pertussis (Tdap, Td) vaccine. You may need a Td booster every 10 years.  Zoster  vaccine. You may need this after age 28.  Pneumococcal 13-valent conjugate (PCV13) vaccine. One dose is recommended after age 8.  Pneumococcal polysaccharide (PPSV23) vaccine. One dose is recommended after age 72. Talk to your health care provider about which screenings and vaccines you need and how often you need them. This information is not intended to replace advice given to you by your health care provider. Make sure you discuss any questions you have with your health care provider. Document Released: 06/17/2015 Document Revised: 02/08/2016 Document Reviewed: 03/22/2015 Elsevier Interactive Patient Education  2017 [place] Prevention in the Home Falls can cause injuries. They can happen to people of all ages. There are many things you can do to make your home safe and to help prevent falls. What can I do on the outside of my home?  Regularly fix the edges of walkways and driveways and fix any cracks.  Remove anything that might make you trip as you walk through a door, such as a raised step or threshold.  Trim any bushes or trees on the path to your home.  Use bright outdoor lighting.  Clear any walking paths of anything that might make someone trip, such as rocks or tools.  Regularly check to see if handrails are loose or broken. Make sure that both sides of any steps have handrails.  Any raised decks and porches should have guardrails on the edges.  Have any leaves, snow, or ice cleared regularly.  Use sand or salt on walking paths during winter.  Clean up any spills in your garage right away. This includes oil or grease spills. What can I do in the bathroom?  Use night lights.  Install grab bars by the toilet and in the tub and shower. Do not use towel bars as grab bars.  Use non-skid mats or decals in the tub or shower.  If you need to sit down in the shower, use a plastic, non-slip stool.  Keep the floor dry. Clean up any water that spills on the  floor as soon as it happens.  Remove soap buildup in the tub or shower regularly.  Attach bath mats securely with double-sided non-slip rug tape.  Do not have throw rugs and other things on the floor that can make you trip. What can I do in the bedroom?  Use night lights.  Make sure that you have a light by your bed that is easy to reach.  Do not use any sheets or blankets that are too big for your bed. They should not hang down onto the floor.  Have a firm chair that has side arms. You can use this for support while you get dressed.  Do not have throw rugs and other things on the floor that can make you trip. What can I do in the kitchen?  Clean up any spills right away.  Avoid walking on wet floors.  Keep items that you use a lot in easy-to-reach places.  If you need to reach something above you, use a strong step stool that has a grab bar.  Keep electrical cords out of the way.  Do not use floor polish or wax that makes floors slippery. If you must use wax, use non-skid floor wax.  Do not have throw rugs  and other things on the floor that can make you trip. What can I do with my stairs?  Do not leave any items on the stairs.  Make sure that there are handrails on both sides of the stairs and use them. Fix handrails that are broken or loose. Make sure that handrails are as long as the stairways.  Check any carpeting to make sure that it is firmly attached to the stairs. Fix any carpet that is loose or worn.  Avoid having throw rugs at the top or bottom of the stairs. If you do have throw rugs, attach them to the floor with carpet tape.  Make sure that you have a light switch at the top of the stairs and the bottom of the stairs. If you do not have them, ask someone to add them for you. What else can I do to help prevent falls?  Wear shoes that:  Do not have high heels.  Have rubber bottoms.  Are comfortable and fit you well.  Are closed at the toe. Do not wear  sandals.  If you use a stepladder:  Make sure that it is fully opened. Do not climb a closed stepladder.  Make sure that both sides of the stepladder are locked into place.  Ask someone to hold it for you, if possible.  Clearly mark and make sure that you can see:  Any grab bars or handrails.  First and last steps.  Where the edge of each step is.  Use tools that help you move around (mobility aids) if they are needed. These include:  Canes.  Walkers.  Scooters.  Crutches.  Turn on the lights when you go into a dark area. Replace any light bulbs as soon as they burn out.  Set up your furniture so you have a clear path. Avoid moving your furniture around.  If any of your floors are uneven, fix them.  If there are any pets around you, be aware of where they are.  Review your medicines with your doctor. Some medicines can make you feel dizzy. This can increase your chance of falling. Ask your doctor what other things that you can do to help prevent falls. This information is not intended to replace advice given to you by your health care provider. Make sure you discuss any questions you have with your health care provider. Document Released: 03/17/2009 Document Revised: 10/27/2015 Document Reviewed: 06/25/2014 Elsevier Interactive Patient Education  2017 [employer].

## 2020-05-24 DIAGNOSIS — H2513 Age-related nuclear cataract, bilateral: Secondary | ICD-10-CM | POA: Diagnosis not present

## 2020-06-02 ENCOUNTER — Telehealth: Payer: Self-pay | Admitting: Internal Medicine

## 2020-06-02 NOTE — Telephone Encounter (Signed)
Medical Record request obtained through fax for [facility]. Faxed to [facility] medical records and scanned in to documents

## 2020-07-11 ENCOUNTER — Other Ambulatory Visit: Payer: Self-pay | Admitting: Internal Medicine

## 2020-07-11 DIAGNOSIS — I69359 Hemiplegia and hemiparesis following cerebral infarction affecting unspecified side: Secondary | ICD-10-CM

## 2020-07-29 ENCOUNTER — Other Ambulatory Visit: Payer: Self-pay | Admitting: Internal Medicine

## 2020-07-29 DIAGNOSIS — K2100 Gastro-esophageal reflux disease with esophagitis, without bleeding: Secondary | ICD-10-CM

## 2020-07-29 DIAGNOSIS — F324 Major depressive disorder, single episode, in partial remission: Secondary | ICD-10-CM

## 2020-07-29 DIAGNOSIS — E785 Hyperlipidemia, unspecified: Secondary | ICD-10-CM

## 2020-07-29 DIAGNOSIS — I69359 Hemiplegia and hemiparesis following cerebral infarction affecting unspecified side: Secondary | ICD-10-CM

## 2020-08-01 ENCOUNTER — Encounter: Payer: Self-pay | Admitting: Internal Medicine

## 2020-08-01 ENCOUNTER — Other Ambulatory Visit
Admission: RE | Admit: 2020-08-01 | Discharge: 2020-08-01 | Disposition: A | Discharge status: Home / Self Care | Payer: PPO | Attending: Internal Medicine | Admitting: Internal Medicine

## 2020-08-01 ENCOUNTER — Other Ambulatory Visit: Payer: Self-pay

## 2020-08-01 ENCOUNTER — Ambulatory Visit (INDEPENDENT_AMBULATORY_CARE_PROVIDER_SITE_OTHER): Payer: PPO | Admitting: Internal Medicine

## 2020-08-01 VITALS — BP 136/82 | HR 88 | Ht 62.0 in | Wt 124.0 lb

## 2020-08-01 DIAGNOSIS — E785 Hyperlipidemia, unspecified: Secondary | ICD-10-CM | POA: Insufficient documentation

## 2020-08-01 DIAGNOSIS — Z0000 Encounter for general adult medical examination without abnormal findings: Secondary | ICD-10-CM

## 2020-08-01 DIAGNOSIS — Z1159 Encounter for screening for other viral diseases: Secondary | ICD-10-CM | POA: Insufficient documentation

## 2020-08-01 DIAGNOSIS — F324 Major depressive disorder, single episode, in partial remission: Secondary | ICD-10-CM | POA: Diagnosis not present

## 2020-08-01 DIAGNOSIS — D6869 Other thrombophilia: Secondary | ICD-10-CM

## 2020-08-01 DIAGNOSIS — K2100 Gastro-esophageal reflux disease with esophagitis, without bleeding: Secondary | ICD-10-CM | POA: Insufficient documentation

## 2020-08-01 DIAGNOSIS — I69359 Hemiplegia and hemiparesis following cerebral infarction affecting unspecified side: Secondary | ICD-10-CM

## 2020-08-01 LAB — COMPREHENSIVE METABOLIC PANEL
ALT: 15 U/L (ref 0–44)
AST: 22 U/L (ref 15–41)
Albumin: 4.5 g/dL (ref 3.5–5.0)
Alkaline Phosphatase: 49 U/L (ref 38–126)
Anion gap: 14 (ref 5–15)
BUN: 15 mg/dL (ref 8–23)
CO2: 27 mmol/L (ref 22–32)
Calcium: 9.9 mg/dL (ref 8.9–10.3)
Chloride: 98 mmol/L (ref 98–111)
Creatinine, Ser: 0.49 mg/dL (ref 0.44–1.00)
GFR, Estimated: >60 mL/min (ref >60)
Glucose, Bld: 100 mg/dL — ABNORMAL HIGH (ref 70–99)
Potassium: 4.5 mmol/L (ref 3.5–5.1)
Sodium: 139 mmol/L (ref 135–145)
Total Bilirubin: 0.5 mg/dL (ref 0.3–1.2)
Total Protein: 7.7 g/dL (ref 6.5–8.1)

## 2020-08-01 LAB — CBC WITH DIFFERENTIAL/PLATELET
Abs Immature Granulocytes: 0.02 10*3/uL (ref 0.00–0.07)
Basophils Absolute: 0.1 10*3/uL (ref 0.0–0.1)
Basophils Relative: 1 %
Eosinophils Absolute: 0.2 10*3/uL (ref 0.0–0.5)
Eosinophils Relative: 3 %
HCT: 43 % (ref 36.0–46.0)
Hemoglobin: 13.9 g/dL (ref 12.0–15.0)
Immature Granulocytes: 0 %
Lymphocytes Relative: 31 %
Lymphs Abs: 1.8 10*3/uL (ref 0.7–4.0)
MCH: 34 pg (ref 26.0–34.0)
MCHC: 32.3 g/dL (ref 30.0–36.0)
MCV: 105.1 fL — ABNORMAL HIGH (ref 80.0–100.0)
Monocytes Absolute: 0.4 10*3/uL (ref 0.1–1.0)
Monocytes Relative: 7 %
Neutro Abs: 3.2 10*3/uL (ref 1.7–7.7)
Neutrophils Relative %: 58 %
Platelets: 239 10*3/uL (ref 150–400)
RBC: 4.09 MIL/uL (ref 3.87–5.11)
RDW: 11.9 % (ref 11.5–15.5)
WBC: 5.7 10*3/uL (ref 4.0–10.5)
nRBC: 0 % (ref 0.0–0.2)

## 2020-08-01 LAB — LIPID PANEL
Cholesterol: 211 mg/dL — ABNORMAL HIGH (ref 0–200)
HDL: 92 mg/dL (ref >40)
LDL Cholesterol: 94 mg/dL (ref 0–99)
Total CHOL/HDL Ratio: 2.3 RATIO
Triglycerides: 123 mg/dL (ref <150)
VLDL: 25 mg/dL (ref 0–40)

## 2020-08-01 LAB — TSH: TSH: 1.153 u[IU]/mL (ref 0.350–4.500)

## 2020-08-01 LAB — HEPATITIS C ANTIBODY: HCV Ab: NONREACTIVE

## 2020-08-01 MED ORDER — SERTRALINE HCL 100 MG PO TABS: 100.0000 mg | ORAL_TABLET | Freq: Every day | ORAL | 1 refills | Status: DC

## 2020-08-01 MED ORDER — ALPRAZOLAM 0.25 MG PO TABS: 0.2500 mg | ORAL_TABLET | Freq: Every day | ORAL | 0 refills | Status: DC | PRN

## 2020-08-01 MED ORDER — TRAZODONE HCL 50 MG PO TABS: 50.0000 mg | ORAL_TABLET | Freq: Every day | ORAL | 1 refills | Status: DC

## 2020-08-01 MED ORDER — SIMVASTATIN 40 MG PO TABS: 40.0000 mg | ORAL_TABLET | Freq: Every day | ORAL | 2 refills | Status: DC
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
## 2020-08-27 ENCOUNTER — Other Ambulatory Visit: Payer: Self-pay | Admitting: Internal Medicine

## 2020-08-27 DIAGNOSIS — K2100 Gastro-esophageal reflux disease with esophagitis, without bleeding: Secondary | ICD-10-CM

## 2020-08-27 NOTE — Telephone Encounter (Signed)
Approved per protocol.  Requested Prescriptions  Pending Prescriptions Disp Refills  . pantoprazole (PROTONIX) 40 MG tablet [Pharmacy Med Name: PANTOPRAZOLE SOD DR 40 MG TAB] 180 tablet 1    Sig: TAKE 1 TABLET BY MOUTH TWICE A DAY     Gastroenterology: Proton Pump Inhibitors Passed - 08/27/2020 11:35 AM      Passed - Valid encounter within last 12 months    Recent Outpatient Visits          3 weeks ago Annual physical exam   [facility] Glean Hess, MD   4 months ago Anterior chest wall pain   [facility] Glean Hess, MD   7 months ago Depression, major, single episode, in partial remission [facility])   [facility] Glean Hess, MD   1 year ago Annual physical exam   [facility] Glean Hess, MD   1 year ago Depression, major, single episode, in partial remission [facility])   [facility] Glean Hess, MD      Future Appointments            In 64 months [facility] Melia Jesse Sans, MD [facility]

## 2020-08-30 ENCOUNTER — Other Ambulatory Visit: Payer: Self-pay | Admitting: Internal Medicine

## 2020-08-30 DIAGNOSIS — F324 Major depressive disorder, single episode, in partial remission: Secondary | ICD-10-CM

## 2020-08-30 DIAGNOSIS — I69359 Hemiplegia and hemiparesis following cerebral infarction affecting unspecified side: Secondary | ICD-10-CM

## 2020-08-30 NOTE — Telephone Encounter (Signed)
Requested Prescriptions  Pending Prescriptions Disp Refills  . mirtazapine (REMERON) 30 MG tablet [Pharmacy Med Name: MIRTAZAPINE 30 MG TABLET] 90 tablet 3    Sig: TAKE 1 TABLET BY MOUTH EVERYDAY AT BEDTIME     Psychiatry: Antidepressants - mirtazapine Failed - 08/30/2020  7:07 PM      Failed - Total Cholesterol in normal range and within 360 days    Cholesterol, Total  Date Value Ref Range Status  07/22/2018 223 (H) 100 - 199 mg/dL Final   Cholesterol  Date Value Ref Range Status  08/01/2020 211 (H) 0 - 200 mg/dL Final  02/01/2013 168 0 - 200 mg/dL Final         Passed - AST in normal range and within 360 days    AST  Date Value Ref Range Status  08/01/2020 22 15 - 41 U/L Final   SGOT(AST)  Date Value Ref Range Status  02/01/2013 38 (H) 15 - 37 Unit/L Final         Passed - ALT in normal range and within 360 days    ALT  Date Value Ref Range Status  08/01/2020 15 0 - 44 U/L Final   SGPT (ALT)  Date Value Ref Range Status  02/01/2013 21 12 - 78 U/L Final         Passed - Triglycerides in normal range and within 360 days    Triglycerides  Date Value Ref Range Status  08/01/2020 123 <150 mg/dL Final  02/01/2013 70 0 - 200 mg/dL Final         Passed - WBC in normal range and within 360 days    WBC  Date Value Ref Range Status  08/01/2020 5.7 4.0 - 10.5 K/uL Final         Passed - Completed PHQ-2 or PHQ-9 in the last 360 days      Passed - Valid encounter within last 6 months    Recent Outpatient Visits          4 weeks ago Annual physical exam   [facility] Glean Hess, [facility]   4 months ago Anterior chest wall pain   [facility] Glean Hess, MD   7 months ago Depression, major, single episode, in partial remission [facility])   [facility] Glean Hess, MD   1 year ago Annual physical exam   [facility] Glean Hess, MD   1 year ago Depression, major, single episode, in partial remission [facility])   [facility] Glean Hess, MD      Future Appointments            In 11 months Army Melia Jesse Sans, MD [facility], [place]           . clopidogrel (PLAVIX) 75 MG tablet [Pharmacy Med Name: CLOPIDOGREL 75 MG TABLET] 90 tablet 3    Sig: TAKE 1 TABLET BY MOUTH EVERY DAY     Hematology: Antiplatelets - clopidogrel Failed - 08/30/2020  7:07 PM      Failed - Evaluate AST, ALT within 2 months of therapy initiation.      Passed - ALT in normal range and within 360 days    ALT  Date Value Ref Range Status  08/01/2020 15 0 - 44 U/L Final   SGPT (ALT)  Date Value Ref Range Status  02/01/2013 21 12 - 78 U/L Final         Passed - AST in normal  range and within 360 days    AST  Date Value Ref Range Status  08/01/2020 22 15 - 41 U/L Final   SGOT(AST)  Date Value Ref Range Status  02/01/2013 38 (H) 15 - 37 Unit/L Final         Passed - HCT in normal range and within 180 days    HCT  Date Value Ref Range Status  08/01/2020 43.0 36.0 - 46.0 % Final   Hematocrit  Date Value Ref Range Status  07/22/2018 40.0 34.0 - 46.6 % Final         Passed - HGB in normal range and within 180 days    Hemoglobin  Date Value Ref Range Status  08/01/2020 13.9 12.0 - 15.0 g/dL Final  07/22/2018 13.5 11.1 - 15.9 g/dL Final         Passed - PLT in normal range and within 180 days    Platelets  Date Value Ref Range Status  08/01/2020 239 150 - 400 K/uL Final  07/22/2018 222 150 - 450 x10E3/uL Final         Passed - Valid encounter within last 6 months    Recent Outpatient Visits          4 weeks ago Annual physical exam   [facility] Glean Hess, MD   4 months ago Anterior chest wall pain   [facility] Glean Hess, MD   7 months ago Depression, major, single episode, in partial remission [facility])   [facility] Glean Hess, MD   1 year ago Annual physical exam   [facility] Glean Hess, MD   1 year ago Depression,  major, single episode, in partial remission [facility])   [facility] Glean Hess, MD      Future Appointments            In 11 months Army Melia Jesse Sans, MD [facility], [facility]

## 2020-09-03 ENCOUNTER — Other Ambulatory Visit: Payer: Self-pay | Admitting: Internal Medicine

## 2020-09-03 DIAGNOSIS — F324 Major depressive disorder, single episode, in partial remission: Secondary | ICD-10-CM

## 2020-09-03 NOTE — Telephone Encounter (Signed)
Requested medication (s) are due for refill today: yes  Requested medication (s) are on the active medication list: yes  Last refill:  07/12/20  Future visit scheduled: yes  Notes to clinic:  med not delegated to NT   Requested Prescriptions  Pending Prescriptions Disp Refills   ALPRAZolam (XANAX) 0.25 MG tablet [Pharmacy Med Name: ALPRAZOLAM 0.25 MG TABLET] 30 tablet 0    Sig: TAKE 1 TABLET BY MOUTH DAILY AS NEEDED.      Not Delegated - Psychiatry:  Anxiolytics/Hypnotics Failed - 09/03/2020  9:59 AM      Failed - This refill cannot be delegated      Failed - Urine Drug Screen completed in last 360 days      Passed - Valid encounter within last 6 months    Recent Outpatient Visits           1 month ago Annual physical exam   [facility] Glean Hess, MD   4 months ago Anterior chest wall pain   [facility] Glean Hess, MD   7 months ago Depression, major, single episode, in partial remission [facility])   [facility] Glean Hess, MD   1 year ago Annual physical exam   [facility] Glean Hess, MD   1 year ago Depression, major, single episode, in partial remission [facility])   [facility] Glean Hess, MD       Future Appointments             In 63 months Army Melia Jesse Sans, MD [facility], [facility]

## 2020-09-05 NOTE — Telephone Encounter (Signed)
Please advise. Last office visit 08/01/2020.  KP

## 2020-10-01 ENCOUNTER — Other Ambulatory Visit: Payer: Self-pay | Admitting: Internal Medicine

## 2020-10-01 DIAGNOSIS — F324 Major depressive disorder, single episode, in partial remission: Secondary | ICD-10-CM

## 2020-10-01 NOTE — Telephone Encounter (Signed)
Requested medication (s) are due for refill today: yes  Requested medication (s) are on the active medication list: yes  Last refill:  09/05/20  Future visit scheduled: yes  Notes to clinic:  Med not delegated to NT to RF   Requested Prescriptions  Pending Prescriptions Disp Refills   ALPRAZolam (XANAX) 0.25 MG tablet [Pharmacy Med Name: ALPRAZOLAM 0.25 MG TABLET] 30 tablet 0    Sig: TAKE 1 TABLET BY MOUTH EVERY DAY AS NEEDED      Not Delegated - Psychiatry:  Anxiolytics/Hypnotics Failed - 10/01/2020 10:19 AM      Failed - This refill cannot be delegated      Failed - Urine Drug Screen completed in last 360 days      Passed - Valid encounter within last 6 months    Recent Outpatient Visits           2 months ago Annual physical exam   [facility] Glean Hess, MD   5 months ago Anterior chest wall pain   [facility] Glean Hess, MD   8 months ago Depression, major, single episode, in partial remission [facility])   [facility] Glean Hess, MD   1 year ago Annual physical exam   [facility] Glean Hess, MD   1 year ago Depression, major, single episode, in partial remission [facility])   [facility] Glean Hess, MD       Future Appointments             In 10 months Army Melia Jesse Sans, MD [facility], [facility]

## 2020-10-03 NOTE — Telephone Encounter (Signed)
Please review. Last office visit 08/01/2020.  KP

## 2020-10-03 NOTE — Telephone Encounter (Signed)
Please call pt to schedule a follow up for June.  KP

## 2020-11-16 ENCOUNTER — Other Ambulatory Visit: Payer: Self-pay

## 2020-11-16 ENCOUNTER — Encounter: Payer: Self-pay | Admitting: Internal Medicine

## 2020-11-16 ENCOUNTER — Ambulatory Visit (INDEPENDENT_AMBULATORY_CARE_PROVIDER_SITE_OTHER): Payer: PPO | Admitting: Internal Medicine

## 2020-11-16 VITALS — BP 162/82 | HR 92 | Temp 98.9°F | Ht 62.0 in | Wt 123.0 lb

## 2020-11-16 DIAGNOSIS — I69359 Hemiplegia and hemiparesis following cerebral infarction affecting unspecified side: Secondary | ICD-10-CM

## 2020-11-16 DIAGNOSIS — F324 Major depressive disorder, single episode, in partial remission: Secondary | ICD-10-CM | POA: Diagnosis not present

## 2020-11-16 DIAGNOSIS — R030 Elevated blood-pressure reading, without diagnosis of hypertension: Secondary | ICD-10-CM

## 2020-11-16 DIAGNOSIS — M778 Other enthesopathies, not elsewhere classified: Secondary | ICD-10-CM | POA: Diagnosis not present

## 2020-11-16 DIAGNOSIS — K2100 Gastro-esophageal reflux disease with esophagitis, without bleeding: Secondary | ICD-10-CM | POA: Diagnosis not present

## 2020-11-16 MED ORDER — PREDNISONE 10 MG PO TABS
10.0000 mg | ORAL_TABLET | ORAL | 0 refills | Status: AC
Start: 2020-11-16 — End: 2020-11-22
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
## 2020-11-26 ENCOUNTER — Other Ambulatory Visit: Payer: Self-pay | Admitting: Internal Medicine

## 2020-11-26 DIAGNOSIS — F324 Major depressive disorder, single episode, in partial remission: Secondary | ICD-10-CM

## 2020-11-26 NOTE — Telephone Encounter (Signed)
Requested medication (s) are due for refill today: yes  Requested medication (s) are on the active medication list: yes  Last refill:  10/03/20  Future visit scheduled: yes  Notes to clinic:  med not delegated to NT to RF   Requested Prescriptions  Pending Prescriptions Disp Refills   ALPRAZolam (XANAX) 0.25 MG tablet [Pharmacy Med Name: ALPRAZOLAM 0.25 MG TABLET] 30 tablet 1    Sig: TAKE 1 TABLET BY MOUTH EVERY DAY AS NEEDED      Not Delegated - Psychiatry:  Anxiolytics/Hypnotics Failed - 11/26/2020  2:12 PM      Failed - This refill cannot be delegated      Failed - Urine Drug Screen completed in last 360 days      Passed - Valid encounter within last 6 months    Recent Outpatient Visits           1 week ago Depression, major, single episode, in partial remission [facility])   [facility] Glean Hess, MD   3 months ago Annual physical exam   [facility] Glean Hess, MD   7 months ago Anterior chest wall pain   [facility] Glean Hess, MD   10 months ago Depression, major, single episode, in partial remission [facility])   [facility] Glean Hess, MD   1 year ago Annual physical exam   [facility] Glean Hess, MD       Future Appointments             In 2 months Army Melia Jesse Sans, MD [facility], [place]   In 8 months Army Melia, Jesse Sans, MD [facility], [facility]

## 2020-11-30 DIAGNOSIS — H6091 Unspecified otitis externa, right ear: Secondary | ICD-10-CM | POA: Diagnosis not present

## 2021-01-27 ENCOUNTER — Ambulatory Visit (INDEPENDENT_AMBULATORY_CARE_PROVIDER_SITE_OTHER): Payer: PPO | Admitting: Internal Medicine

## 2021-01-27 ENCOUNTER — Encounter: Payer: Self-pay | Admitting: Internal Medicine

## 2021-01-27 ENCOUNTER — Other Ambulatory Visit: Payer: Self-pay

## 2021-01-27 VITALS — BP 140/84 | HR 90 | Temp 98.7°F | Ht 62.0 in | Wt 126.0 lb

## 2021-01-27 DIAGNOSIS — I69359 Hemiplegia and hemiparesis following cerebral infarction affecting unspecified side: Secondary | ICD-10-CM

## 2021-01-27 DIAGNOSIS — F324 Major depressive disorder, single episode, in partial remission: Secondary | ICD-10-CM | POA: Diagnosis not present

## 2021-01-27 DIAGNOSIS — I10 Essential (primary) hypertension: Secondary | ICD-10-CM | POA: Diagnosis not present

## 2021-01-27 DIAGNOSIS — S300XXA Contusion of lower back and pelvis, initial encounter: Secondary | ICD-10-CM | POA: Diagnosis not present

## 2021-01-27 MED ORDER — ALPRAZOLAM 0.25 MG PO TABS: 0.2500 mg | ORAL_TABLET | Freq: Every day | ORAL | 1 refills | Status: DC | PRN

## 2021-01-27 MED ORDER — LISINOPRIL 10 MG PO TABS: 10.0000 mg | ORAL_TABLET | Freq: Every day | ORAL | 1 refills | Status: DC
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
## 2021-02-01 ENCOUNTER — Other Ambulatory Visit: Payer: Self-pay | Admitting: Internal Medicine

## 2021-02-01 DIAGNOSIS — F324 Major depressive disorder, single episode, in partial remission: Secondary | ICD-10-CM

## 2021-02-01 NOTE — Telephone Encounter (Signed)
Requested Prescriptions  Pending Prescriptions Disp Refills  . traZODone (DESYREL) 50 MG tablet [Pharmacy Med Name: TRAZODONE 50 MG TABLET] 90 tablet 1    Sig: TAKE 1 TABLET BY MOUTH EVERYDAY AT BEDTIME     Psychiatry: Antidepressants - Serotonin Modulator Passed - 02/01/2021  7:58 PM      Passed - Completed PHQ-2 or PHQ-9 in the last 360 days      Passed - Valid encounter within last 6 months    Recent Outpatient Visits          5 days ago Essential hypertension   [facility] Glean Hess, MD   2 months ago Depression, major, single episode, in partial remission [facility])   [facility] Glean Hess, MD   6 months ago Annual physical exam   [facility] Glean Hess, [facility]   9 months ago Anterior chest wall pain   [facility] Glean Hess, MD   1 year ago Depression, major, single episode, in partial remission [facility])   [facility] Glean Hess, MD      Future Appointments            In 1 month Army Melia Jesse Sans, MD [facility], [place]   In 6 months Army Melia, Jesse Sans, MD [facility], [facility]

## 2021-02-07 ENCOUNTER — Other Ambulatory Visit: Payer: Self-pay | Admitting: Internal Medicine

## 2021-02-14 ENCOUNTER — Other Ambulatory Visit: Payer: Self-pay | Admitting: Internal Medicine

## 2021-02-14 DIAGNOSIS — K2100 Gastro-esophageal reflux disease with esophagitis, without bleeding: Secondary | ICD-10-CM

## 2021-02-14 DIAGNOSIS — F324 Major depressive disorder, single episode, in partial remission: Secondary | ICD-10-CM

## 2021-02-14 NOTE — Telephone Encounter (Signed)
Requested medication (s) are due for refill today:Yes  Requested medication (s) are on the active medication list:Yes  Last refill:  08/01/20, 08/27/20  Future visit scheduled:Yes  Notes to clinic:  Diagnosis code needed     Requested Prescriptions  Pending Prescriptions Disp Refills   sertraline (ZOLOFT) 100 MG tablet [Pharmacy Med Name: SERTRALINE HCL 100 MG TABLET] 90 tablet 1    Sig: TAKE 1 TABLET BY MOUTH EVERYDAY AT BEDTIME     Psychiatry:  Antidepressants - SSRI Passed - 02/14/2021  3:24 PM      Passed - Completed PHQ-2 or PHQ-9 in the last 360 days      Passed - Valid encounter within last 6 months    Recent Outpatient Visits           2 weeks ago Essential hypertension   [facility] Glean [facility], MD   3 months ago Depression, major, single episode, in partial remission [facility])   [facility] Glean [facility]   6 months ago Annual physical exam   [facility] Glean Hess, MD   9 months ago Anterior chest wall pain   [facility] Glean Hess, MD   1 year ago Depression, major, single episode, in partial remission [facility])   [facility] Glean Hess, MD       Future Appointments             In 1 month Army Melia Jesse Sans, MD [facility], [place]   In 5 months Army Melia Jesse Sans, MD [facility], [facility]             pantoprazole (PROTONIX) 40 MG tablet [Pharmacy Med Name: PANTOPRAZOLE SOD DR 40 MG TAB] 180 tablet 1    Sig: TAKE 1 TABLET BY MOUTH TWICE A DAY     Gastroenterology: Proton Pump Inhibitors Passed - 02/14/2021  3:24 PM      Passed - Valid encounter within last 12 months    Recent Outpatient Visits           2 weeks ago Essential hypertension   [facility] Glean Hess, MD   3 months ago Depression, major, single episode, in partial remission [facility])   [facility] Glean Hess, MD   6 months ago Annual physical exam   [facility]  Glean Hess, MD   9 months ago Anterior chest wall pain   [facility] Glean Hess, MD   1 year ago Depression, major, single episode, in partial remission [facility])   [facility] Glean Hess, MD       Future Appointments             In 1 month Army Melia Jesse Sans, MD [facility], [place]   In 5 months Army Melia, Jesse Sans, MD [facility], [facility]

## 2021-02-25 ENCOUNTER — Other Ambulatory Visit: Payer: Self-pay | Admitting: Internal Medicine

## 2021-02-25 DIAGNOSIS — K2100 Gastro-esophageal reflux disease with esophagitis, without bleeding: Secondary | ICD-10-CM

## 2021-02-25 NOTE — Telephone Encounter (Signed)
Future OV 03/29/21  Approved per protocol.  Requested Prescriptions  Pending Prescriptions Disp Refills  . pantoprazole (PROTONIX) 40 MG tablet [Pharmacy Med Name: PANTOPRAZOLE SOD DR 40 MG TAB] 180 tablet 1    Sig: TAKE 1 TABLET BY MOUTH TWICE A DAY     Gastroenterology: Proton Pump Inhibitors Passed - 02/25/2021 11:22 AM      Passed - Valid encounter within last 12 months    Recent Outpatient Visits          4 weeks ago Essential hypertension   [facility] Glean Hess, MD   3 months ago Depression, major, single episode, in partial remission [facility])   [facility] Gle[facility], [facility]   6 months ago Annual physical exam   [facility] Glean Hess, MD   10 months ago Anterior chest wall pain   [facility] Glean Hess, MD   1 year ago Depression, major, single episode, in partial remission [facility])   [facility] Glean Hess, MD      Future Appointments            In 1 month Army Melia Jesse Sans, MD [facility], [place]   In 5 months Army Melia, Jesse Sans, MD [facility], [facility]

## 2021-03-03 ENCOUNTER — Other Ambulatory Visit: Payer: Self-pay | Admitting: Internal Medicine

## 2021-03-03 NOTE — Telephone Encounter (Signed)
Requested medications are due for refill today requesting one week early  Requested medications are on the active medication list yes  Last refill 02/08/21  Last visit 01/27/21  Future visit scheduled 03/29/21  Notes to clinic requesting inhaler one week early, has appt in Oct, please assess.

## 2021-03-25 ENCOUNTER — Other Ambulatory Visit: Payer: Self-pay | Admitting: Internal Medicine

## 2021-03-25 DIAGNOSIS — F324 Major depressive disorder, single episode, in partial remission: Secondary | ICD-10-CM

## 2021-03-26 NOTE — Telephone Encounter (Signed)
Requested medication (s) are due for refill today: yes  Requested medication (s) are on the active medication list: yes  Last refill:  01/27/21  Future visit scheduled: yes  Notes to clinic:  med not delegated to NT to RF/ UDS not completed within 360 days    Requested Prescriptions  Pending Prescriptions Disp Refills   ALPRAZolam (XANAX) 0.25 MG tablet [Pharmacy Med Name: ALPRAZOLAM 0.25 MG TABLET] 30 tablet 1    Sig: TAKE 1 TABLET BY MOUTH DAILY AS NEEDED.     Not Delegated - Psychiatry:  Anxiolytics/Hypnotics Failed - 03/25/2021  9:39 AM      Failed - This refill cannot be delegated      Failed - Urine Drug Screen completed in last 360 days      Passed - Valid encounter within last 6 months    Recent Outpatient Visits           1 month ago Essential hypertension   [facility] Glean Hess, MD   4 months ago Depression, major, single episode, in partial remission [facility])   [facility] Glean Hess, MD   7 months ago Annual physical exam   [facility] Glean Hess, MD   11 months ago Anterior chest wall pain   [facility] Glean Hess, MD   1 year ago Depression, major, single episode, in partial remission [facility])   [facility] Glean Hess, MD       Future Appointments             In 3 days Glean Hess, MD [facility], [place]   In 4 months Army Melia Jesse Sans, MD [facility], [facility]

## 2021-03-29 ENCOUNTER — Other Ambulatory Visit: Payer: Self-pay

## 2021-03-29 ENCOUNTER — Ambulatory Visit (INDEPENDENT_AMBULATORY_CARE_PROVIDER_SITE_OTHER): Payer: PPO | Admitting: Internal Medicine

## 2021-03-29 ENCOUNTER — Encounter: Payer: Self-pay | Admitting: Internal Medicine

## 2021-03-29 VITALS — BP 122/78 | HR 98 | Ht 62.0 in | Wt 127.6 lb

## 2021-03-29 DIAGNOSIS — F411 Generalized anxiety disorder: Secondary | ICD-10-CM

## 2021-03-29 DIAGNOSIS — F324 Major depressive disorder, single episode, in partial remission: Secondary | ICD-10-CM | POA: Diagnosis not present

## 2021-03-29 DIAGNOSIS — I10 Essential (primary) hypertension: Secondary | ICD-10-CM | POA: Diagnosis not present

## 2021-03-29 DIAGNOSIS — H10401 Unspecified chronic conjunctivitis, right eye: Secondary | ICD-10-CM | POA: Diagnosis not present

## 2021-03-29 DIAGNOSIS — R350 Frequency of micturition: Secondary | ICD-10-CM

## 2021-03-29 LAB — POCT URINALYSIS DIPSTICK
Bilirubin, UA: NEGATIVE
Blood, UA: NEGATIVE
Glucose, UA: NEGATIVE
Ketones, UA: NEGATIVE
Leukocytes, UA: NEGATIVE
Nitrite, UA: NEGATIVE
Protein, UA: NEGATIVE
Spec Grav, UA: 1.01 (ref 1.010–1.025)
Urobilinogen, UA: 0.2 E.U./dL
pH, UA: 5 (ref 5.0–8.0)

## 2021-03-29 MED ORDER — ALPRAZOLAM 0.25 MG PO TABS: 0.2500 mg | ORAL_TABLET | Freq: Two times a day (BID) | ORAL | 2 refills | Status: DC | PRN

## 2021-03-29 MED ORDER — CIPROFLOXACIN HCL 0.3 % OP SOLN: 2.0000 [drp] | OPHTHALMIC | 0 refills | Status: DC
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
## 2021-04-13 ENCOUNTER — Ambulatory Visit: Payer: Self-pay | Admitting: *Deleted

## 2021-04-13 NOTE — Telephone Encounter (Signed)
Reason for Disposition . Caller has medicine question only, adult not sick, AND triager answers question  Answer Assessment - Initial Assessment Questions 1. NAME of MEDICATION: "What medicine are you calling about?"     xanax 2. QUESTION: "What is your question?" (e.g., double dose of medicine, side effect)     Pharmacy will not fill new prescription at this time. 3. PRESCRIBING HCP: "Who prescribed it?" Reason: if prescribed by specialist, call should be referred to that group.     Dr.Berglund 4. SYMPTOMS: "Do you have any symptoms?"     na 5. SEVERITY: If symptoms are present, ask "Are they mild, moderate or severe?"     na 6. PREGNANCY:  "Is there any chance that you are pregnant?" "When was your last menstrual period?"     na  Protocols used: Medication Question Call-A-AH

## 2021-04-13 NOTE — Telephone Encounter (Signed)
Patient reporting pharmacy would not fill her latest prescription for xanax prescribed by Dr. Army Melia. TC to pharmacy-Pharmacist reported she needed to override in the computer.  TC to patient-You may pick up the current prescription of Xanax later today.  Patient agreed.

## 2021-04-17 ENCOUNTER — Telehealth: Payer: Self-pay

## 2021-04-17 NOTE — Telephone Encounter (Signed)
Copied from [place] [id number]. Topic: General - Other >> Apr 14, 2021 10:20 AM Antonieta Iba C wrote: Reason for CRM: pt called in in regards to her medication. Pt wouldn't give details. Pt is requesting a call back from one of Dr. Gaspar Cola assistants.    [phone] >> Apr 14, 2021  2:18 PM Greggory Keen D wrote: Pt called saying calling.she is completely out of her generic xanax.  She said that is why she has been calling.  Dr. Army Melia changed her prescription from one pill an day to 2 a day and that is why she is out early of her med.  CB#  [phone]

## 2021-05-03 ENCOUNTER — Ambulatory Visit (INDEPENDENT_AMBULATORY_CARE_PROVIDER_SITE_OTHER): Payer: PPO

## 2021-05-03 DIAGNOSIS — Z0000 Encounter for general adult medical examination without abnormal findings: Secondary | ICD-10-CM | POA: Diagnosis not present

## 2021-05-03 NOTE — Progress Notes (Signed)
Subjective:   Valerie Hanna is a 80 y.o. female who presents for Medicare Annual (Subsequent) preventive examination.  Virtual Visit via Telephone Note  I connected with  Valerie Hanna on 05/03/21 at  1:20 PM EST by telephone and verified that I am speaking with the correct person using two identifiers.  Location: Patient: home Provider: [facility] Persons participating in the virtual visit: [place]   I discussed the limitations, risks, security and privacy concerns of performing an evaluation and management service by telephone and the availability of in person appointments. The patient expressed understanding and agreed to proceed.  Interactive audio and video telecommunications were attempted between this nurse and patient, however failed, due to patient having technical difficulties OR patient did not have access to video capability.  We continued and completed visit with audio only.  Some vital signs may be absent or patient reported.   Clemetine Marker, LPN   Review of Systems     Cardiac Risk Factors include: advanced age (>81men, >61 women);dyslipidemia;hypertension     Objective:    There were no vitals filed for this visit. There is no height or weight on file to calculate BMI.  Advanced Directives 05/03/2021 05/02/2020 04/13/2019 01/20/2018 01/14/2017 04/18/2016 04/16/2016  Does Patient Have a Medical Advance Directive? Yes Yes Yes Yes Yes No No  Type of Paramedic of [place];Living will [place];Living will [place];Living will [place];Living will [place];Living will - -  Copy of [place] in Chart? No - copy requested No - copy requested No - copy requested No - copy requested No - copy requested - -  Would patient like information on creating a medical advance directive? - - - - - No - patient declined information -    Current  Medications (verified) Outpatient Encounter Medications as of 05/03/2021  Medication Sig   albuterol (VENTOLIN HFA) 108 (90 Base) MCG/ACT inhaler TAKE 2 PUFFS BY MOUTH EVERY 6 HOURS AS NEEDED FOR WHEEZE OR SHORTNESS OF BREATH   ALPRAZolam (XANAX) 0.25 MG tablet Take 1 tablet (0.25 mg total) by mouth 2 (two) times daily as needed.   CALCIUM PO Take 1 tablet by mouth daily.   Cholecalciferol (VITAMIN D3 PO) Take by mouth daily.   ciprofloxacin (CILOXAN) 0.3 % ophthalmic solution Place 2 drops into the right eye every 4 (four) hours while awake. Administer 1 drop, every 2 hours, while awake, for 2 days. Then 1 drop, every 4 hours, while awake, for the next 5 days.   clopidogrel (PLAVIX) 75 MG tablet TAKE 1 TABLET BY MOUTH EVERY DAY   fexofenadine (ALLEGRA) 60 MG tablet Take 60 mg by mouth daily.   lisinopril (ZESTRIL) 10 MG tablet Take 1 tablet (10 mg total) by mouth daily.   mirtazapine (REMERON) 30 MG tablet TAKE 1 TABLET BY MOUTH EVERYDAY AT BEDTIME   Multiple Vitamin (MULTIVITAMIN WITH MINERALS) TABS tablet Take 1 tablet by mouth daily.   pantoprazole (PROTONIX) 40 MG tablet TAKE 1 TABLET BY MOUTH TWICE A DAY   sertraline (ZOLOFT) 100 MG tablet TAKE 1 TABLET BY MOUTH EVERYDAY AT BEDTIME   simvastatin (ZOCOR) 40 MG tablet Take 1 tablet (40 mg total) by mouth daily at 6 PM.   traZODone (DESYREL) 50 MG tablet TAKE 1 TABLET BY MOUTH EVERYDAY AT BEDTIME   ipratropium (ATROVENT) 0.06 % nasal spray PLACE 2 SPRAYS INTO BOTH NOSTRILS 2 (TWO) TIMES DAILY. (Patient not  taking: Reported on 05/03/2021)   No facility-administered encounter medications on file as of 05/03/2021.    Allergies (verified) Cefuroxime axetil and Aspirin   History: Past Medical History:  Diagnosis Date   Allergy    Anemia    Anxiety    Arthritis    Cancer ([place])    melanoma  ON HER BACK MANY YRS AGO   Constipation    Dehydration with hyponatremia 05/26/2015   Depression    GERD (gastroesophageal reflux disease)     H/O hiatal hernia    Headache(784.0)    High cholesterol    History of kidney infection    Hypertension    no longer taking meds since stroke   Osteoporosis    Pleurisy    being treated for this now 01/25/14 (left side)   Stroke ([place])    2014, WITH LEFT SIDED WEAKNESS   Past Surgical History:  Procedure Laterality Date   ABDOMINAL HYSTERECTOMY     APPENDECTOMY     BACK SURGERY     kyphoplasty   bladder tack     CHOLECYSTECTOMY     COLONOSCOPY     DILATION AND CURETTAGE OF UTERUS     EYE SURGERY Right    cataract removed   EYE SURGERY Right    "bubble" put in for a hole in retina   FOOT SURGERY Bilateral    KYPHOPLASTY N/A 01/28/2014   Procedure: LUMBAR FOUR KYPHOPLASTY;  Surgeon: Ashok Pall, MD;  Location: [facility];  Service: Neurosurgery;  Laterality: N/A;  L4 Kyphoplasty   KYPHOPLASTY N/A 04/18/2016   Procedure: KYPHOPLASTY Thoracic eight-Thoracic ten;  Surgeon: Ashok Pall, MD;  Location: [place];  Service: Neurosurgery;  Laterality: N/A;  KYPHOPLASTY T8-T10   TOTAL HIP ARTHROPLASTY Right 05/26/2015   Procedure: RIGHT BIPOLAR VS TOTAL HIP ANTERIOR APPROACH;  Surgeon: Mcarthur Rossetti, MD;  Location: [place];  Service: Orthopedics;  Laterality: Right;   Family History  Problem Relation Age of Onset   Healthy Sister    Healthy Sister    Social History   Socioeconomic History   Marital status: Married    Spouse name: Not on file   Number of children: 1   Years of education: Not on file   Highest education level: 11th grade  Occupational History   Occupation: Retired  Tobacco Use   Smoking status: Former    Packs/day: 0.50    Years: 30.00    Pack years: 15.00    Types: Cigarettes    Quit date: 04/15/2016    Years since quitting: 5.0   Smokeless tobacco: Never   Tobacco comments:    smoking cessation materials not required  Vaping Use   Vaping Use: Never used  Substance and Sexual Activity   Alcohol use: Yes    Alcohol/week: 14.0 - 21.0 standard  drinks    Types: 14 - 21 Glasses of wine per week   Drug use: No   Sexual activity: Not Currently  Other Topics Concern   Not on file  Social History Narrative   Pt lives with husband, only child lives in a group home.    Social Determinants of Health   Financial Resource Strain: Low Risk    Difficulty of Paying Living Expenses: Not hard at all  Food Insecurity: No Food Insecurity   Worried About Charity fundraiser in the Last Year: Never true   Ran Out of Food in the Last Year: Never true  Transportation Needs: No Transportation Needs  Lack of Transportation (Medical): No   Lack of Transportation (Non-Medical): No  Physical Activity: Inactive   Days of Exercise per Week: 0 days   Minutes of Exercise per Session: 0 min  Stress: No Stress Concern Present   Feeling of Stress : Not at all  Social Connections: Moderately Isolated   Frequency of Communication with Friends and Family: More than three times a week   Frequency of Social Gatherings with Friends and Family: Once a week   Attends Religious Services: Never   Marine scientist or Organizations: No   Attends Music therapist: Never   Marital Status: Married    Tobacco Counseling Counseling given: Not Answered Tobacco comments: smoking cessation materials not required   Clinical Intake:  Pre-visit preparation completed: Yes  Pain : No/denies pain     Nutritional Risks: None Diabetes: No  How often do you need to have someone help you when you read instructions, pamphlets, or other written materials from your doctor or pharmacy?: 1 - Never    Interpreter Needed?: No  Information entered by :: Clemetine Marker LPN   Activities of Daily Living In your present state of health, do you have any difficulty performing the following activities: 05/03/2021 03/29/2021  Hearing? Tempie Donning  Vision? N N  Difficulty concentrating or making decisions? Tempie Donning  Walking or climbing stairs? Y Y  Dressing or  bathing? Y Y  Doing errands, shopping? Tempie Donning  Preparing Food and eating ? N -  Using the Toilet? N -  In the past six months, have you accidently leaked urine? N -  Do you have problems with loss of bowel control? N -  Managing your Medications? N -  Managing your Finances? N -  Housekeeping or managing your Housekeeping? N -  Some recent data might be hidden    Patient Care Team: Glean Hess, MD as PCP - General (Internal Medicine) Garrel Ridgel, DPM as Consulting Physician (Podiatry)  Indicate any recent Medical Services you may have received from other than [facility] providers in the past year (date may be approximate).     Assessment:   This is a routine wellness examination for [place].  Hearing/Vision screen Hearing Screening - Comments:: Pt denies hearing difficulty Vision Screening - Comments:: Annual vision screenings [facility] with [facility] Thomasene Ripple [facility]  Dietary issues and exercise activities discussed: Current Exercise Habits: The patient does not participate in regular exercise at present, Exercise limited by: orthopedic condition(s)   Goals Addressed   None    Depression Screen PHQ 2/9 Scores 05/03/2021 03/29/2021 01/27/2021 11/16/2020 08/01/2020 05/02/2020 04/27/2020  PHQ - 2 Score 0 2 0 0 0 0 0  PHQ- 9 Score 2 6 2 2  0 - 3    Fall Risk Fall Risk  05/03/2021 03/29/2021 01/27/2021 11/16/2020 08/01/2020  Falls in the past year? 0 0 1 0 0  Number falls in past yr: 0 0 0 - 0  Injury with Fall? 0 0 1 - 0  Risk Factor Category  - - - - -  Risk for fall due to : History of fall(s);Impaired balance/gait History of fall(s);Impaired balance/gait History of fall(s) - History of fall(s)  Risk for fall due to: Comment - - - - -  Follow up Falls prevention discussed Falls evaluation completed Falls evaluation completed Falls evaluation completed Falls evaluation completed    [place]:  Any stairs in or around the home? Yes  If so,  are there any without handrails? No  Home free of loose throw rugs in walkways, pet beds, electrical cords, etc? Yes  Adequate lighting in your home to reduce risk of falls? Yes   ASSISTIVE DEVICES UTILIZED TO PREVENT FALLS:  Life alert? No  Use of a cane, walker or w/c? Yes  Grab bars in the bathroom? Yes  Shower chair or bench in shower? Yes  Elevated toilet seat or a handicapped toilet? Yes   TIMED UP AND GO:  Was the test performed? No . Telephonic visit.   Cognitive Function: Normal cognitive status assessed by direct observation by this Nurse Health Advisor. No abnormalities found.       6CIT Screen 05/02/2020 04/13/2019 01/20/2018 01/14/2017  What Year? 0 points 0 points 0 points 0 points  What month? 0 points 0 points 0 points 0 points  What time? 0 points 0 points 0 points 0 points  Count back from 20 0 points 0 points 0 points 0 points  Months in reverse 2 points 4 points 0 points 0 points  Repeat phrase 4 points 2 points 4 points 4 points  Total Score 6 6 4 4     Immunizations  There is no immunization history on file for this patient.  TDAP status: Due, Education has been provided regarding the importance of this vaccine. Advised may receive this vaccine at local pharmacy or Health Dept. Aware to provide a copy of the vaccination record if obtained from local pharmacy or Health Dept. Verbalized acceptance and understanding.  Flu Vaccine status: Declined, Education has been provided regarding the importance of this vaccine but patient still declined. Advised may receive this vaccine at local pharmacy or Health Dept. Aware to provide a copy of the vaccination record if obtained from local pharmacy or Health Dept. Verbalized acceptance and understanding.  Pneumococcal vaccine status: Declined,  Education has been provided regarding the importance of this vaccine but patient still declined. Advised may receive this vaccine at local pharmacy or Health Dept. Aware to provide a  copy of the vaccination record if obtained from local pharmacy or Health Dept. Verbalized acceptance and understanding.   Covid-19 vaccine status: Declined, Education has been provided regarding the importance of this vaccine but patient still declined. Advised may receive this vaccine at local pharmacy or Health Dept.or vaccine clinic. Aware to provide a copy of the vaccination record if obtained from local pharmacy or Health Dept. Verbalized acceptance and understanding.  Qualifies for Shingles Vaccine? Yes   Zostavax completed No   Shingrix Completed?: No.    Education has been provided regarding the importance of this vaccine. Patient has been advised to call insurance company to determine out of pocket expense if they have not yet received this vaccine. Advised may also receive vaccine at local pharmacy or Health Dept. Verbalized acceptance and understanding.  Screening Tests Health Maintenance  Topic Date Due   COVID-19 Vaccine (1) 05/19/2021 (Originally 07/19/1941)   Zoster Vaccines- Shingrix (1 of 2) 06/29/2021 (Originally 01/15/1960)   Pneumonia Vaccine 65+ Years old (1 - PCV) 03/29/2022 (Originally 01/15/1947)   INFLUENZA VACCINE  06/05/2023 (Originally 01/02/2021)   TETANUS/TDAP  11/30/2024   Hepatitis C Screening  Completed   HPV VACCINES  Aged Out   COLONOSCOPY (Pts 45-15yrs Insurance coverage will need to be confirmed)  Discontinued    Health Maintenance  There are no preventive care reminders to display for this patient.   Colorectal cancer screening: No longer required.   Mammogram status: No  longer required due to age.  Bone Density status: no longer required due to age  Lung Cancer Screening: (Low Dose CT Chest recommended if Age 17-80 years, 30 pack-year currently smoking OR have quit w/in 15years.) does not qualify.   Additional Screening:  Hepatitis C Screening: does qualify; Completed 08/01/20  Vision Screening: Recommended annual ophthalmology exams for early  detection of glaucoma and other disorders of the eye. Is the patient up to date with their annual eye exam?  Yes  Who is the provider or what is the name of the office in which the patient attends annual eye exams? [facility].   Dental Screening: Recommended annual dental exams for proper oral hygiene  Community Resource Referral / Chronic Care Management: CRR required this visit?  No   CCM required this visit?  No      Plan:     I have personally reviewed and noted the following in the patient's chart:   Medical and social history Use of alcohol, tobacco or illicit drugs  Current medications and supplements including opioid prescriptions.  Functional ability and status Nutritional status Physical activity Advanced directives List of other physicians Hospitalizations, surgeries, and ER visits in previous 12 months Vitals Screenings to include cognitive, depression, and falls Referrals and appointments  In addition, I have reviewed and discussed with patient certain preventive protocols, quality metrics, and best practice recommendations. A written personalized care plan for preventive services as well as general preventive health recommendations were provided to patient.     Clemetine Marker, LPN   [id number]   Nurse Notes: none

## 2021-05-03 NOTE — Patient Instructions (Signed)
Valerie Hanna , Thank you for taking time to come for your Medicare Wellness Visit. I appreciate your ongoing commitment to your health goals. Please review the following plan we discussed and let me know if I can assist you in the future.   Screening recommendations/referrals: Colonoscopy: no longer required Mammogram: no longer required Bone Density: no longer required Recommended yearly ophthalmology/optometry visit for glaucoma screening and checkup Recommended yearly dental visit for hygiene and checkup  Vaccinations: Influenza vaccine: declined Pneumococcal vaccine: declined Tdap vaccine: due Shingles vaccine: Shingrix discussed. Please contact your pharmacy for coverage information.  Covid-19: declined  Advanced directives: Please bring a copy of your health care power of attorney and living will to the office at your convenience.   Conditions/risks identified: Recommend continuing fall prevention in the home  Next appointment: Follow up in one year for your annual wellness visit    Preventive Care 78 Years and Older, Female Preventive care refers to lifestyle choices and visits with your health care provider that can promote health and wellness. What does preventive care include? A yearly physical exam. This is also called an annual well check. Dental exams once or twice a year. Routine eye exams. Ask your health care provider how often you should have your eyes checked. Personal lifestyle choices, including: Daily care of your teeth and gums. Regular physical activity. Eating a healthy diet. Avoiding tobacco and drug use. Limiting alcohol use. Practicing safe sex. Taking low-dose aspirin every day. Taking vitamin and mineral supplements as recommended by your health care provider. What happens during an annual well check? The services and screenings done by your health care provider during your annual well check will depend on your age, overall health, lifestyle risk  factors, and family history of disease. Counseling  Your health care provider may ask you questions about your: Alcohol use. Tobacco use. Drug use. Emotional well-being. Home and relationship well-being. Sexual activity. Eating habits. History of falls. Memory and ability to understand (cognition). Work and work Statistician. Reproductive health. Screening  You may have the following tests or measurements: Height, weight, and BMI. Blood pressure. Lipid and cholesterol levels. These may be checked every 5 years, or more frequently if you are over 65 years old. Skin check. Lung cancer screening. You may have this screening every year starting at age 69 if you have a 30-pack-year history of smoking and currently smoke or have quit within the past 15 years. Fecal occult blood test (FOBT) of the stool. You may have this test every year starting at age 78. Flexible sigmoidoscopy or colonoscopy. You may have a sigmoidoscopy every 5 years or a colonoscopy every 10 years starting at age 78. Hepatitis C blood test. Hepatitis B blood test. Sexually transmitted disease (STD) testing. Diabetes screening. This is done by checking your blood sugar (glucose) after you have not eaten for a while (fasting). You may have this done every 1-3 years. Bone density scan. This is done to screen for osteoporosis. You may have this done starting at age 78. Mammogram. This may be done every 1-2 years. Talk to your health care provider about how often you should have regular mammograms. Talk with your health care provider about your test results, treatment options, and if necessary, the need for more tests. Vaccines  Your health care provider may recommend certain vaccines, such as: Influenza vaccine. This is recommended every year. Tetanus, diphtheria, and acellular pertussis (Tdap, Td) vaccine. You may need a Td booster every 10 years. Zoster vaccine. You may  need this after age 78. Pneumococcal 13-valent  conjugate (PCV13) vaccine. One dose is recommended after age 78. Pneumococcal polysaccharide (PPSV23) vaccine. One dose is recommended after age 78. Talk to your health care provider about which screenings and vaccines you need and how often you need them. This information is not intended to replace advice given to you by your health care provider. Make sure you discuss any questions you have with your health care provider. Document Released: 06/17/2015 Document Revised: 02/08/2016 Document Reviewed: 03/22/2015 Elsevier Interactive Patient Education  2017 [place] Prevention in the Home Falls can cause injuries. They can happen to people of all ages. There are many things you can do to make your home safe and to help prevent falls. What can I do on the outside of my home? Regularly fix the edges of walkways and driveways and fix any cracks. Remove anything that might make you trip as you walk through a door, such as a raised step or threshold. Trim any bushes or trees on the path to your home. Use bright outdoor lighting. Clear any walking paths of anything that might make someone trip, such as rocks or tools. Regularly check to see if handrails are loose or broken. Make sure that both sides of any steps have handrails. Any raised decks and porches should have guardrails on the edges. Have any leaves, snow, or ice cleared regularly. Use sand or salt on walking paths during winter. Clean up any spills in your garage right away. This includes oil or grease spills. What can I do in the bathroom? Use night lights. Install grab bars by the toilet and in the tub and shower. Do not use towel bars as grab bars. Use non-skid mats or decals in the tub or shower. If you need to sit down in the shower, use a plastic, non-slip stool. Keep the floor dry. Clean up any water that spills on the floor as soon as it happens. Remove soap buildup in the tub or shower regularly. Attach bath mats  securely with double-sided non-slip rug tape. Do not have throw rugs and other things on the floor that can make you trip. What can I do in the bedroom? Use night lights. Make sure that you have a light by your bed that is easy to reach. Do not use any sheets or blankets that are too big for your bed. They should not hang down onto the floor. Have a firm chair that has side arms. You can use this for support while you get dressed. Do not have throw rugs and other things on the floor that can make you trip. What can I do in the kitchen? Clean up any spills right away. Avoid walking on wet floors. Keep items that you use a lot in easy-to-reach places. If you need to reach something above you, use a strong step stool that has a grab bar. Keep electrical cords out of the way. Do not use floor polish or wax that makes floors slippery. If you must use wax, use non-skid floor wax. Do not have throw rugs and other things on the floor that can make you trip. What can I do with my stairs? Do not leave any items on the stairs. Make sure that there are handrails on both sides of the stairs and use them. Fix handrails that are broken or loose. Make sure that handrails are as long as the stairways. Check any carpeting to make sure that it is firmly attached  to the stairs. Fix any carpet that is loose or worn. Avoid having throw rugs at the top or bottom of the stairs. If you do have throw rugs, attach them to the floor with carpet tape. Make sure that you have a light switch at the top of the stairs and the bottom of the stairs. If you do not have them, ask someone to add them for you. What else can I do to help prevent falls? Wear shoes that: Do not have high heels. Have rubber bottoms. Are comfortable and fit you well. Are closed at the toe. Do not wear sandals. If you use a stepladder: Make sure that it is fully opened. Do not climb a closed stepladder. Make sure that both sides of the stepladder  are locked into place. Ask someone to hold it for you, if possible. Clearly mark and make sure that you can see: Any grab bars or handrails. First and last steps. Where the edge of each step is. Use tools that help you move around (mobility aids) if they are needed. These include: Canes. Walkers. Scooters. Crutches. Turn on the lights when you go into a dark area. Replace any light bulbs as soon as they burn out. Set up your furniture so you have a clear path. Avoid moving your furniture around. If any of your floors are uneven, fix them. If there are any pets around you, be aware of where they are. Review your medicines with your doctor. Some medicines can make you feel dizzy. This can increase your chance of falling. Ask your doctor what other things that you can do to help prevent falls. This information is not intended to replace advice given to you by your health care provider. Make sure you discuss any questions you have with your health care provider. Document Released: 03/17/2009 Document Revised: 10/27/2015 Document Reviewed: 06/25/2014 Elsevier Interactive Patient Education  2017 [employer].

## 2021-05-06 ENCOUNTER — Other Ambulatory Visit: Payer: Self-pay | Admitting: Internal Medicine

## 2021-05-06 DIAGNOSIS — E785 Hyperlipidemia, unspecified: Secondary | ICD-10-CM

## 2021-05-06 NOTE — Telephone Encounter (Signed)
Requested Prescriptions  Pending Prescriptions Disp Refills  . simvastatin (ZOCOR) 40 MG tablet [Pharmacy Med Name: SIMVASTATIN 40 MG TABLET] 90 tablet 0    Sig: TAKE 1 TABLET BY MOUTH DAILY AT 6 PM.     Cardiovascular:  Antilipid - Statins Failed - 05/06/2021  1:47 AM      Failed - Total Cholesterol in normal range and within 360 days    Cholesterol, Total  Date Value Ref Range Status  07/22/2018 223 (H) 100 - 199 mg/dL Final   Cholesterol  Date Value Ref Range Status  08/01/2020 211 (H) 0 - 200 mg/dL Final  02/01/2013 168 0 - 200 mg/dL Final         Passed - LDL in normal range and within 360 days    Ldl Cholesterol, Calc  Date Value Ref Range Status  02/01/2013 87 0 - 100 mg/dL Final   LDL Calculated  Date Value Ref Range Status  07/22/2018 96 0 - 99 mg/dL Final   LDL Cholesterol  Date Value Ref Range Status  08/01/2020 94 0 - 99 mg/dL Final    Comment:           Total Cholesterol/HDL:CHD Risk Coronary Heart Disease Risk Table                     Men   Women  1/2 Average Risk   3.4   3.3  Average Risk       5.0   4.4  2 X Average Risk   9.6   7.1  3 X Average Risk  23.4   11.0        Use the calculated Patient Ratio above and the CHD Risk Table to determine the patient's CHD Risk.        ATP III CLASSIFICATION (LDL):  <100     mg/dL   Optimal  100-129  mg/dL   Near or Above                    Optimal  130-159  mg/dL   Borderline  160-189  mg/dL   High  >190     mg/dL   Very High Performed at [facility], [address] [place], [place] [address]          Passed - HDL in normal range and within 360 days    HDL Cholesterol  Date Value Ref Range Status  02/01/2013 67 (H) 40 - 60 mg/dL Final   HDL  Date Value Ref Range Status  08/01/2020 92 >40 mg/dL Final  07/22/2018 103 >39 mg/dL Final         Passed - Triglycerides in normal range and within 360 days    Triglycerides  Date Value Ref Range Status  08/01/2020 123 <150 mg/dL Final   02/01/2013 70 0 - 200 mg/dL Final         Passed - Patient is not pregnant      Passed - Valid encounter within last 12 months    Recent Outpatient Visits          1 month ago Essential hypertension   [facility] Glean Hess, MD   3 months ago Essential hypertension   [facility] Glean Hess, MD   5 months ago Depression, major, single episode, in partial remission [facility])   [facility] Glean Hess, MD   9 months ago Annual physical exam   [place],  Jesse Sans, MD   1 year ago Anterior chest wall pain   [facility] Glean Hess, MD      Future Appointments            In 2 months [facility] Melia Jesse Sans, MD [facility], [facility]

## 2021-05-11 DIAGNOSIS — H6091 Unspecified otitis externa, right ear: Secondary | ICD-10-CM | POA: Diagnosis not present

## 2021-05-19 DIAGNOSIS — Z8669 Personal history of other diseases of the nervous system and sense organs: Secondary | ICD-10-CM | POA: Diagnosis not present

## 2021-05-19 DIAGNOSIS — Z09 Encounter for follow-up examination after completed treatment for conditions other than malignant neoplasm: Secondary | ICD-10-CM | POA: Diagnosis not present

## 2021-05-23 ENCOUNTER — Other Ambulatory Visit: Payer: Self-pay | Admitting: Internal Medicine

## 2021-05-23 DIAGNOSIS — K2100 Gastro-esophageal reflux disease with esophagitis, without bleeding: Secondary | ICD-10-CM

## 2021-05-23 NOTE — Telephone Encounter (Signed)
Requested Prescriptions  Pending Prescriptions Disp Refills   pantoprazole (PROTONIX) 40 MG tablet [Pharmacy Med Name: PANTOPRAZOLE SOD DR 40 MG TAB] 180 tablet 1    Sig: TAKE 1 TABLET BY MOUTH TWICE A DAY     Gastroenterology: Proton Pump Inhibitors Passed - 05/23/2021  1:32 AM      Passed - Valid encounter within last 12 months    Recent Outpatient Visits          1 month ago Essential hypertension   [facility] Glean Hess, MD   3 months ago Essential hypertension   [facility] Glean Hess, MD   6 months ago Depression, major, single episode, in partial remission [facility])   [facility] Glean Hess, MD   9 months ago Annual physical exam   [facility] Glean Hess, MD   1 year ago Anterior chest wall pain   [facility] Glean Hess, MD      Future Appointments            In 2 months Army Melia Jesse Sans, MD [facility], [facility]

## 2021-05-30 DIAGNOSIS — H2512 Age-related nuclear cataract, left eye: Secondary | ICD-10-CM | POA: Diagnosis not present

## 2021-05-30 DIAGNOSIS — H35371 Puckering of macula, right eye: Secondary | ICD-10-CM | POA: Diagnosis not present

## 2021-06-13 DIAGNOSIS — H903 Sensorineural hearing loss, bilateral: Secondary | ICD-10-CM | POA: Diagnosis not present

## 2021-06-13 DIAGNOSIS — H6123 Impacted cerumen, bilateral: Secondary | ICD-10-CM | POA: Diagnosis not present

## 2021-06-15 DIAGNOSIS — H2512 Age-related nuclear cataract, left eye: Secondary | ICD-10-CM | POA: Diagnosis not present

## 2021-06-22 DIAGNOSIS — H6121 Impacted cerumen, right ear: Secondary | ICD-10-CM | POA: Diagnosis not present

## 2021-06-22 DIAGNOSIS — L2084 Intrinsic (allergic) eczema: Secondary | ICD-10-CM | POA: Diagnosis not present

## 2021-06-29 ENCOUNTER — Encounter: Payer: Self-pay | Admitting: Ophthalmology

## 2021-06-29 ENCOUNTER — Ambulatory Visit: Payer: PPO | Admitting: Certified Registered Nurse Anesthetist

## 2021-06-29 ENCOUNTER — Ambulatory Visit
Admission: RE | Admit: 2021-06-29 | Discharge: 2021-06-29 | Disposition: A | Discharge status: Home / Self Care | Payer: PPO | Attending: Ophthalmology | Admitting: Ophthalmology

## 2021-06-29 ENCOUNTER — Other Ambulatory Visit: Payer: Self-pay

## 2021-06-29 ENCOUNTER — Encounter
Admission: RE | Disposition: A | Discharge status: Home / Self Care | Payer: Self-pay | Source: Home / Self Care | Attending: Ophthalmology

## 2021-06-29 DIAGNOSIS — H2512 Age-related nuclear cataract, left eye: Secondary | ICD-10-CM | POA: Diagnosis not present

## 2021-06-29 DIAGNOSIS — I69354 Hemiplegia and hemiparesis following cerebral infarction affecting left non-dominant side: Secondary | ICD-10-CM | POA: Insufficient documentation

## 2021-06-29 DIAGNOSIS — H269 Unspecified cataract: Secondary | ICD-10-CM | POA: Diagnosis not present

## 2021-06-29 DIAGNOSIS — E7800 Pure hypercholesterolemia, unspecified: Secondary | ICD-10-CM | POA: Diagnosis not present

## 2021-06-29 DIAGNOSIS — I10 Essential (primary) hypertension: Secondary | ICD-10-CM | POA: Insufficient documentation

## 2021-06-29 DIAGNOSIS — Z87891 Personal history of nicotine dependence: Secondary | ICD-10-CM | POA: Diagnosis not present

## 2021-06-29 HISTORY — PX: CATARACT EXTRACTION W/PHACO: SHX586

## 2021-06-29 SURGERY — PHACOEMULSIFICATION, CATARACT, WITH IOL INSERTION
Anesthesia: Monitor Anesthesia Care | Site: Eye | Laterality: Left

## 2021-06-29 MED ORDER — MIDAZOLAM HCL 2 MG/2ML IJ SOLN
INTRAMUSCULAR | Status: AC
  Filled 2021-06-29: qty 2

## 2021-06-29 MED ORDER — MOXIFLOXACIN HCL 0.5 % OP SOLN: 1.0000 [drp] | Freq: Once | OPHTHALMIC | Status: DC

## 2021-06-29 MED ORDER — SIGHTPATH DOSE#1 BSS IO SOLN
INTRAOCULAR | Status: DC | PRN
  Administered 2021-06-29: 78 mL via OPHTHALMIC

## 2021-06-29 MED ORDER — MIDAZOLAM HCL 2 MG/2ML IJ SOLN
INTRAMUSCULAR | Status: DC | PRN
  Administered 2021-06-29: 1 mg via INTRAVENOUS
  Administered 2021-06-29 (×2): .5 mg via INTRAVENOUS

## 2021-06-29 MED ORDER — PHENYLEPHRINE HCL (PRESSORS) 10 MG/ML IV SOLN
INTRAVENOUS | Status: AC
  Filled 2021-06-29: qty 1

## 2021-06-29 MED ORDER — ARMC OPHTHALMIC DILATING GEL
1.0000 "application " | OPHTHALMIC | Status: AC
  Administered 2021-06-29 (×3): 1 via OPHTHALMIC
  Filled 2021-06-29: qty 0.25

## 2021-06-29 MED ORDER — MOXIFLOXACIN HCL 0.5 % OP SOLN
OPHTHALMIC | Status: AC
  Filled 2021-06-29: qty 3

## 2021-06-29 MED ORDER — SIGHTPATH DOSE#1 BSS IO SOLN
INTRAOCULAR | Status: DC | PRN
  Administered 2021-06-29: 15 mL via INTRAOCULAR

## 2021-06-29 MED ORDER — ARMC OPHTHALMIC DILATING DROPS
OPHTHALMIC | Status: AC
  Filled 2021-06-29: qty 0.5

## 2021-06-29 MED ORDER — FENTANYL CITRATE (PF) 100 MCG/2ML IJ SOLN
INTRAMUSCULAR | Status: AC
  Filled 2021-06-29: qty 2

## 2021-06-29 MED ORDER — PHENYLEPHRINE HCL-NACL 20-0.9 MG/250ML-% IV SOLN
INTRAVENOUS | Status: AC
  Filled 2021-06-29: qty 250

## 2021-06-29 MED ORDER — FENTANYL CITRATE (PF) 100 MCG/2ML IJ SOLN
INTRAMUSCULAR | Status: DC | PRN
  Administered 2021-06-29: 50 ug via INTRAVENOUS

## 2021-06-29 MED ORDER — BRIMONIDINE TARTRATE-TIMOLOL 0.2-0.5 % OP SOLN
OPHTHALMIC | Status: DC | PRN
  Administered 2021-06-29: 1 [drp] via OPHTHALMIC

## 2021-06-29 MED ORDER — SODIUM CHLORIDE 0.9 % IV SOLN: INTRAVENOUS | Status: DC

## 2021-06-29 MED ORDER — SIGHTPATH DOSE#1 BSS IO SOLN
INTRAOCULAR | Status: DC | PRN
  Administered 2021-06-29: 2 mL

## 2021-06-29 MED ORDER — TETRACAINE HCL 0.5 % OP SOLN
1.0000 [drp] | Freq: Once | OPHTHALMIC | Status: AC
  Administered 2021-06-29: 1 [drp] via OPHTHALMIC

## 2021-06-29 MED ORDER — CEFUROXIME OPHTHALMIC INJECTION 1 MG/0.1 ML
INJECTION | OPHTHALMIC | Status: DC | PRN
  Administered 2021-06-29: 0.1 mL via INTRACAMERAL

## 2021-06-29 MED ORDER — ERYTHROMYCIN 5 MG/GM OP OINT
TOPICAL_OINTMENT | OPHTHALMIC | Status: DC | PRN
  Administered 2021-06-29: 1

## 2021-06-29 MED ORDER — TETRACAINE HCL 0.5 % OP SOLN
OPHTHALMIC | Status: AC
  Administered 2021-06-29: 1 [drp] via OPHTHALMIC
  Filled 2021-06-29: qty 4

## 2021-06-29 MED ORDER — SIGHTPATH DOSE#1 NA HYALUR & NA CHOND-NA HYALUR IO KIT
PACK | INTRAOCULAR | Status: DC | PRN
  Administered 2021-06-29: 1 via OPHTHALMIC

## 2021-06-29 SURGICAL SUPPLY — 17 items
CANNULA ANT/CHMB 27G (MISCELLANEOUS) IMPLANT
CANNULA ANT/CHMB 27GA (MISCELLANEOUS) ×2 IMPLANT
CATARACT SUITE SIGHTPATH (MISCELLANEOUS) ×2 IMPLANT
FEE CATARACT SUITE SIGHTPATH (MISCELLANEOUS) ×1 IMPLANT
GLOVE SRG 8 PF TXTR STRL LF DI (GLOVE) ×1 IMPLANT
GLOVE SURG ENC MOIS LTX SZ8 (GLOVE) ×2 IMPLANT
GLOVE SURG ENC TEXT LTX SZ7.5 (GLOVE) ×2 IMPLANT
GLOVE SURG UNDER POLY LF SZ8 (GLOVE) ×2
GOWN STRL REUS W/ TWL LRG LVL3 (GOWN DISPOSABLE) ×2 IMPLANT
GOWN STRL REUS W/TWL LRG LVL3 (GOWN DISPOSABLE) ×4
LENS IOL TECNIS EYHANCE 23.5 (Intraocular Lens) ×1 IMPLANT
NDL FILTER BLUNT 18X1 1/2 (NEEDLE) ×2 IMPLANT
NEEDLE FILTER BLUNT 18X 1/2SAF (NEEDLE) ×2
NEEDLE FILTER BLUNT 18X1 1/2 (NEEDLE) ×2 IMPLANT
SYR 3ML LL SCALE MARK (SYRINGE) ×4 IMPLANT
SYR TB 1ML 27GX1/2 LL (SYRINGE) ×2 IMPLANT
WATER STERILE IRR 500ML POUR (IV SOLUTION) ×1 IMPLANT

## 2021-06-29 NOTE — Progress Notes (Signed)
Reported blood pressure of 167/111 to Dr. Rosey Bath

## 2021-06-29 NOTE — Op Note (Signed)
°  OPERATIVE NOTE  Valerie Hanna [id number] 06/29/2021   PREOPERATIVE DIAGNOSIS:  Nuclear sclerotic cataract left eye. H25.12   POSTOPERATIVE DIAGNOSIS:    Nuclear sclerotic cataract left eye.     PROCEDURE:  Phacoemusification with posterior chamber intraocular lens placement of the left eye  Ultrasound time: Procedure(s): CATARACT EXTRACTION PHACO AND INTRAOCULAR LENS PLACEMENT (IOC) LEFT 11.45 01:25.4 (Left)  LENS:   Implant Name Type Inv. Item Serial No. Manufacturer Lot No. LRB No. Used Action  LENS IOL TECNIS EYHANCE 23.5 - [id number] Intraocular Lens LENS IOL TECNIS EYHANCE 23.5 [id number] [employer]  Left 1 Implanted      SURGEON:  Wyonia Hough, MD   ANESTHESIA:  Topical with tetracaine drops and 2% Xylocaine jelly, augmented with 1% preservative-free intracameral lidocaine.    COMPLICATIONS:  None.   DESCRIPTION OF PROCEDURE:  The patient was identified in the holding room and transported to the operating room and placed in the supine position under the operating microscope.  The left eye was identified as the operative eye and it was prepped and draped in the usual sterile ophthalmic fashion.   A 1 millimeter clear-corneal paracentesis was made at the 1:30 position.  0.5 ml of preservative-free 1% lidocaine was injected into the anterior chamber.  The anterior chamber was filled with Viscoat viscoelastic.  A 2.4 millimeter keratome was used to make a near-clear corneal incision at the 10:30 position.  .  A curvilinear capsulorrhexis was made with a cystotome and capsulorrhexis forceps.  Balanced salt solution was used to hydrodissect and hydrodelineate the nucleus.   Phacoemulsification was then used in stop and chop fashion to remove the lens nucleus and epinucleus.  The remaining cortex was then removed using the irrigation and aspiration handpiece. Provisc was then placed into the capsular bag to distend it for lens placement.  A lens was then injected into the  capsular bag.  The remaining viscoelastic was aspirated.   Wounds were hydrated with balanced salt solution.  The anterior chamber was inflated to a physiologic pressure with balanced salt solution.  No wound leaks were noted. Cefuroxime 0.1 ml of a 10mg /ml solution was injected into the anterior chamber for a dose of 1 mg of intracameral antibiotic at the completion of the case.   Timolol and Brimonidine drops were applied to the eye.  The patient was taken to the recovery room in stable condition without complications of anesthesia or surgery.  Valerie Hanna 06/29/2021, 9:32 AM

## 2021-06-29 NOTE — Transfer of Care (Signed)
Immediate Anesthesia Transfer of Care Note  Patient: Valerie Hanna  Procedure(s) Performed: CATARACT EXTRACTION PHACO AND INTRAOCULAR LENS PLACEMENT (IOC) LEFT 11.45 01:25.4 (Left: Eye)  Patient Location: PACU  Anesthesia Type:MAC  Level of Consciousness: awake, alert  and oriented  Airway & Oxygen Therapy: Patient Spontanous Breathing  Post-op Assessment: Report given to RN and Post -op Vital signs reviewed and stable  Post vital signs: Reviewed and stable  Last Vitals:  Vitals Value Taken Time  BP    Temp    Pulse    Resp    SpO2      Last Pain:  Vitals:   06/29/21 0726  TempSrc: Temporal  PainSc: 0-No pain         Complications: No notable events documented.

## 2021-06-29 NOTE — Discharge Instructions (Signed)

## 2021-06-29 NOTE — Addendum Note (Signed)
Addendum  created 06/29/21 1017 by Martha Clan, MD   [place] recorded in [place], [place] filed

## 2021-06-29 NOTE — Anesthesia Procedure Notes (Signed)
Date/Time: 06/29/2021 9:12 AM Performed by: Demetrius Charity, CRNA Oxygen Delivery Method: Nasal cannula Placement Confirmation: CO2 detector and positive ETCO2

## 2021-06-29 NOTE — Anesthesia Preprocedure Evaluation (Signed)
Anesthesia Evaluation  Patient identified by MRN, date of birth, ID band Patient awake    Reviewed: Allergy & Precautions, H&P , NPO status , Patient's Chart, lab work & pertinent test results, reviewed documented beta blocker date and time   History of Anesthesia Complications Negative for: history of anesthetic complications  Airway Mallampati: II  TM Distance: >3 FB Neck ROM: full    Dental  (+) Dental Advidsory Given, Poor Dentition   Pulmonary neg pulmonary ROS, former smoker,    Pulmonary exam normal breath sounds clear to auscultation       Cardiovascular Exercise Tolerance: Good hypertension, (-) angina(-) Past MI and (-) Cardiac Stents Normal cardiovascular exam(-) dysrhythmias (-) Valvular Problems/Murmurs Rhythm:regular Rate:Normal     Neuro/Psych  Headaches, neg Seizures PSYCHIATRIC DISORDERS Anxiety Depression CVA (left sided weakness), Residual Symptoms    GI/Hepatic Neg liver ROS, hiatal hernia, GERD  ,  Endo/Other  negative endocrine ROS  Renal/GU negative Renal ROS  negative genitourinary   Musculoskeletal   Abdominal   Peds  Hematology negative hematology ROS (+)   Anesthesia Other Findings Past Medical History: No date: Allergy No date: Anemia No date: Anxiety No date: Arthritis No date: Cancer ([place])     Comment:  melanoma  ON HER BACK MANY YRS AGO No date: Constipation 05/26/2015: Dehydration with hyponatremia No date: Depression No date: GERD (gastroesophageal reflux disease) No date: H/O hiatal hernia No date: Headache(784.0) No date: High cholesterol No date: History of kidney infection No date: Hypertension     Comment:  no longer taking meds since stroke No date: Osteoporosis No date: Pleurisy     Comment:  being treated for this now 01/25/14 (left side) No date: Stroke [facility])     Comment:  2014, WITH LEFT SIDED WEAKNESS   Reproductive/Obstetrics negative OB ROS                              Anesthesia Physical Anesthesia Plan  ASA: 3  Anesthesia Plan: MAC   Post-op Pain Management:    Induction: Intravenous  PONV Risk Score and Plan: 2 and Midazolam  Airway Management Planned: Natural Airway and Nasal Cannula  Additional Equipment:   Intra-op Plan:   Post-operative Plan:   Informed Consent: I have reviewed the patients History and Physical, chart, labs and discussed the procedure including the risks, benefits and alternatives for the proposed anesthesia with the patient or authorized representative who has indicated his/her understanding and acceptance.     Dental Advisory Given  Plan Discussed with: Anesthesiologist, CRNA and Surgeon  Anesthesia Plan Comments:         Anesthesia Quick Evaluation

## 2021-06-29 NOTE — Anesthesia Postprocedure Evaluation (Signed)
Anesthesia Post Note  Patient: Valerie Hanna  Procedure(s) Performed: CATARACT EXTRACTION PHACO AND INTRAOCULAR LENS PLACEMENT (IOC) LEFT 11.45 01:25.4 (Left: Eye)  Patient location during evaluation: Phase II Anesthesia Type: MAC Level of consciousness: awake and alert Pain management: pain level controlled Vital Signs Assessment: post-procedure vital signs reviewed and stable Respiratory status: spontaneous breathing, nonlabored ventilation and respiratory function stable Cardiovascular status: stable and blood pressure returned to baseline Postop Assessment: no apparent nausea or vomiting Anesthetic complications: no   No notable events documented.   Last Vitals:  Vitals:   06/29/21 0726 06/29/21 0937  BP: (!) 167/111 (!) 156/68  Pulse: 84 79  Resp: (!) 24 16  Temp: (!) 36.1 C (!) 36.1 C  SpO2: 96% 97%    Last Pain:  Vitals:   06/29/21 0937  TempSrc: Temporal  PainSc: 0-No pain                 Blima Singer
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
## 2021-07-12 ENCOUNTER — Other Ambulatory Visit: Payer: Self-pay | Admitting: Internal Medicine

## 2021-07-12 DIAGNOSIS — E785 Hyperlipidemia, unspecified: Secondary | ICD-10-CM

## 2021-07-12 DIAGNOSIS — I69359 Hemiplegia and hemiparesis following cerebral infarction affecting unspecified side: Secondary | ICD-10-CM

## 2021-07-12 DIAGNOSIS — F324 Major depressive disorder, single episode, in partial remission: Secondary | ICD-10-CM

## 2021-07-12 DIAGNOSIS — F411 Generalized anxiety disorder: Secondary | ICD-10-CM

## 2021-07-12 DIAGNOSIS — I10 Essential (primary) hypertension: Secondary | ICD-10-CM

## 2021-07-12 NOTE — Telephone Encounter (Signed)
Requested medication (s) are due for refill today: yes  Requested medication (s) are on the active medication list: yes  Last refill:  02/15/21 #90 with 1 RF  Future visit scheduled: 08/03/21  Notes to clinic:  This medication can not be delegated, please assess.     Requested Prescriptions  Pending Prescriptions Disp Refills   sertraline (ZOLOFT) 100 MG tablet [Pharmacy Med Name: SERTRALINE HCL 100 MG TABLET] 90 tablet 1    Sig: TAKE 1 TABLET BY MOUTH EVERYDAY AT BEDTIME     Not Delegated - Psychiatry:  Antidepressants - SSRI - sertraline Failed - 07/12/2021  7:50 PM      Failed - This refill cannot be delegated      Passed - AST in normal range and within 360 days    AST  Date Value Ref Range Status  08/01/2020 22 15 - 41 U/L Final   SGOT(AST)  Date Value Ref Range Status  02/01/2013 38 (H) 15 - 37 Unit/L Final          Passed - ALT in normal range and within 360 days    ALT  Date Value Ref Range Status  08/01/2020 15 0 - 44 U/L Final   SGPT (ALT)  Date Value Ref Range Status  02/01/2013 21 12 - 78 U/L Final          Passed - Completed PHQ-2 or PHQ-9 in the last 360 days      Passed - Valid encounter within last 6 months    Recent Outpatient Visits           3 months ago Essential hypertension   [place] [facility]   5 months ago Essential hypertension   [facility]   7 months ago Depression, major, single episode, in partial remission [facility])   [facility] Gle[facility], [facility]   11 months ago Annual physical exam   [facility], [facility]   1 year ago Anterior chest wall pain   [facility] Glean [facility], [facility]       Future Appointments             In 3 weeks Glean Hess, MD [facility]            Signed Prescriptions Disp Refills   simvastatin (ZOCOR) 40 MG tablet 90 tablet 0    Sig: TAKE 1 TABLET BY MOUTH DAILY AT 6 PM.      Cardiovascular:  Antilipid - Statins Failed - 07/12/2021  7:50 PM      Failed - Lipid Panel in normal range within the last 12 months    Cholesterol, Total  Date Value Ref Range Status  07/22/2018 223 (H) 100 - 199 mg/dL Final   Cholesterol  Date Value Ref Range Status  08/01/2020 211 (H) 0 - 200 mg/dL Final  02/01/2013 168 0 - 200 mg/dL Final   Ldl Cholesterol, Calc  Date Value Ref Range Status  02/01/2013 87 0 - 100 mg/dL Final   LDL Calculated  Date Value Ref Range Status  07/22/2018 96 0 - 99 mg/dL Final   LDL Cholesterol  Date Value Ref Range Status  08/01/2020 94 0 - 99 mg/dL Final    Comment:           Total Cholesterol/HDL:CHD Risk Coronary Heart Disease Risk Table  Men   Women  1/2 Average Risk   3.4   3.3  Average Risk       5.0   4.4  2 X Average Risk   9.6   7.1  3 X Average Risk  23.4   11.0        Use the calculated Patient Ratio above and the CHD Risk Table to determine the patient's CHD Risk.        ATP III CLASSIFICATION (LDL):  <100     mg/dL   Optimal  100-129  mg/dL   Near or Above                    Optimal  130-159  mg/dL   Borderline  160-189  mg/dL   High  >190     mg/dL   Very High Performed at [facility], [place], [place] [address]    HDL Cholesterol  Date Value Ref Range Status  02/01/2013 67 (H) 40 - 60 mg/dL Final   HDL  Date Value Ref Range Status  08/01/2020 92 >40 mg/dL Final  07/22/2018 103 >39 mg/dL Final   Triglycerides  Date Value Ref Range Status  08/01/2020 123 <150 mg/dL Final  02/01/2013 70 0 - 200 mg/dL Final         Passed - Patient is not pregnant      Passed - Valid encounter within last 12 months    Recent Outpatient Visits           3 months ago Essential hypertension   [facility], [facility]   5 months ago Essential hypertension   [facility]   7 months ago Depression, major, single episode, in partial  remission [facility])   [facility] Glean [facility], [facility]   11 months ago Annual physical exam   [facility] Glean Hess, MD   1 year ago Anterior chest wall pain   [facility] Glean [facility], MD       Future Appointments             In 3 weeks Glean Hess, MD [facility]             mirtazapine (REMERON) 30 MG tablet 90 tablet 1    Sig: TAKE 1 TABLET BY MOUTH EVERYDAY AT BEDTIME     Psychiatry: Antidepressants - mirtazapine Passed - 07/12/2021  7:50 PM      Passed - Completed PHQ-2 or PHQ-9 in the last 360 days      Passed - Valid encounter within last 6 months    Recent Outpatient Visits           3 months ago Essential hypertension   [place], Laura H, MD   5 months ago Essential hypertension   [facility] Glean Hess, MD   7 months ago Depression, major, single episode, in partial remission [facility])   [facility] Gle[facility], [facility]   11 months ago Annual physical exam   [facility] Gle[facility], [facility]   1 year ago Anterior chest wall pain   [facility] Glean [facility], MD       Future Appointments             In 3 weeks Army Melia Jesse Sans, MD [facility], [facility]  albuterol (VENTOLIN HFA) 108 (90 Base) MCG/ACT inhaler 8.5 each 0    Sig: TAKE 2 PUFFS BY MOUTH EVERY 6 HOURS AS NEEDED FOR WHEEZE OR SHORTNESS OF BREATH     Pulmonology:  Beta Agonists 2 Failed - 07/12/2021  7:50 PM      Failed - Last BP in normal range    BP Readings from Last 1 Encounters:  06/29/21 (!) 156/68          Passed - Last Heart Rate in normal range    Pulse Readings from Last 1 Encounters:  06/29/21 79          Passed - Valid encounter within last 12 months    Recent Outpatient Visits           3 months ago Essential hypertension   [facility] Gle[facility], MD   5 months ago Essential hypertension   [facility]   7 months ago Depression, major, single episode, in partial remission [facility])   [facility] Glean Hess, [facility]   11 months ago Annual physical exam   [facility], [facility]   1 year ago Anterior chest wall pain   [facility] Glean Hess, MD       Future Appointments             In 3 weeks Glean Hess, MD [facility]             lisinopril (ZESTRIL) 10 MG tablet 90 tablet 0    Sig: TAKE 1 TABLET BY MOUTH EVERY DAY     Cardiovascular:  ACE Inhibitors Failed - 07/12/2021  7:50 PM      Failed - Cr in normal range and within 180 days    Creatinine  Date Value Ref Range Status  02/23/2013 0.45 (L) 0.60 - 1.30 mg/dL Final   Creatinine, Ser  Date Value Ref Range Status  08/01/2020 0.49 0.44 - 1.00 mg/dL Final          Failed - K in normal range and within 180 days    Potassium  Date Value Ref Range Status  08/01/2020 4.5 3.5 - 5.1 mmol/L Final  02/23/2013 4.1 3.5 - 5.1 mmol/L Final          Failed - Last BP in normal range    BP Readings from Last 1 Encounters:  06/29/21 (!) 156/68          Passed - Patient is not pregnant      Passed - Valid encounter within last 6 months    Recent Outpatient Visits           3 months ago Essential hypertension   [place], Laura H, MD   5 months ago Essential hypertension   [facility] Gle[facility], MD   7 months ago Depression, major, single episode, in partial remission [facility])   [facility] Glean Hess, MD   11 months ago Annual physical exam   [facility] Glean Hess, MD   1 year ago Anterior chest wall pain   [facility] Gle[facility], [facility]       Future Appointments             In 3 weeks Army Melia Jesse Sans, MD [facility]             traZODone (DESYREL) 50  MG tablet 90 tablet 1    Sig: TAKE 1 TABLET BY MOUTH  EVERYDAY AT BEDTIME     Psychiatry: Antidepressants - Serotonin Modulator Passed - 07/12/2021  7:50 PM      Passed - Completed PHQ-2 or PHQ-9 in the last 360 days      Passed - Valid encounter within last 6 months    Recent Outpatient Visits           3 months ago Essential hypertension   [facility] Gle[facility], MD   5 months ago Essential hypertension   [facility] Gle[facility], [facility]   7 months ago Depression, major, single episode, in partial remission [facility])   [facility] Gle[facility], [facility]   11 months ago Annual physical exam   [facility], [facility]   1 year ago Anterior chest wall pain   [facility] Gle[facility], MD       Future Appointments             In 3 weeks Glean Hess, MD [facility]             clopidogrel (PLAVIX) 75 MG tablet 90 tablet 0    Sig: TAKE 1 [place] DAY     Hematology: Antiplatelets - clopidogrel Failed - 07/12/2021  7:50 PM      Failed - HCT in normal range and within 180 days    HCT  Date Value Ref Range Status  08/01/2020 43.0 36.0 - 46.0 % Final   Hematocrit  Date Value Ref Range Status  07/22/2018 40.0 34.0 - 46.6 % Final          Failed - HGB in normal range and within 180 days    Hemoglobin  Date Value Ref Range Status  08/01/2020 13.9 12.0 - 15.0 g/dL Final  07/22/2018 13.5 11.1 - 15.9 g/dL Final          Failed - PLT in normal range and within 180 days    Platelets  Date Value Ref Range Status  08/01/2020 239 150 - 400 K/uL Final  07/22/2018 222 150 - 450 x10E3/uL Final          Passed - Cr in normal range and within 360 days    Creatinine  Date Value Ref Range Status  02/23/2013 0.45 (L) 0.60 - 1.30 mg/dL Final   Creatinine, Ser  Date Value Ref Range Status  08/01/2020 0.49 0.44 - 1.00 mg/dL Final          Passed - Valid encounter within last 6 months    Recent Outpatient Visits           3  months ago Essential hypertension   [place], Laura H, MD   5 months ago Essential hypertension   [facility] Glean Hess, MD   7 months ago Depression, major, single episode, in partial remission [facility])   [facility] Glean [facility], [facility]   11 months ago Annual physical exam   [facility] Glean Hess, MD   1 year ago Anterior chest wall pain   [facility] Glean Hess, MD       Future Appointments             In 3 weeks Army Melia Jesse Sans, MD [facility], [facility]

## 2021-07-12 NOTE — Telephone Encounter (Signed)
Please review. Last office visit 05/03/2021.  KP

## 2021-07-12 NOTE — Telephone Encounter (Signed)
Requested Prescriptions  Pending Prescriptions Disp Refills   simvastatin (ZOCOR) 40 MG tablet [Pharmacy Med Name: SIMVASTATIN 40 MG TABLET] 90 tablet 0    Sig: TAKE 1 TABLET BY MOUTH DAILY AT 6 PM.     Cardiovascular:  Antilipid - Statins Failed - 07/12/2021  7:50 PM      Failed - Lipid Panel in normal range within the last 12 months    Cholesterol, Total  Date Value Ref Range Status  07/22/2018 223 (H) 100 - 199 mg/dL Final   Cholesterol  Date Value Ref Range Status  08/01/2020 211 (H) 0 - 200 mg/dL Final  02/01/2013 168 0 - 200 mg/dL Final   Ldl Cholesterol, Calc  Date Value Ref Range Status  02/01/2013 87 0 - 100 mg/dL Final   LDL Calculated  Date Value Ref Range Status  07/22/2018 96 0 - 99 mg/dL Final   LDL Cholesterol  Date Value Ref Range Status  08/01/2020 94 0 - 99 mg/dL Final    Comment:           Total Cholesterol/HDL:CHD Risk Coronary Heart Disease Risk Table                     Men   Women  1/2 Average Risk   3.4   3.3  Average Risk       5.0   4.4  2 X Average Risk   9.6   7.1  3 X Average Risk  23.4   11.0        Use the calculated Patient Ratio above and the CHD Risk Table to determine the patient's CHD Risk.        ATP III CLASSIFICATION (LDL):  <100     mg/dL   Optimal  100-129  mg/dL   Near or Above                    Optimal  130-159  mg/dL   Borderline  160-189  mg/dL   High  >190     mg/dL   Very High Performed at [facility], [place], [place] [address]    HDL Cholesterol  Date Value Ref Range Status  02/01/2013 67 (H) 40 - 60 mg/dL Final   HDL  Date Value Ref Range Status  08/01/2020 92 >40 mg/dL Final  07/22/2018 103 >39 mg/dL Final   Triglycerides  Date Value Ref Range Status  08/01/2020 123 <150 mg/dL Final  02/01/2013 70 0 - 200 mg/dL Final         Passed - Patient is not pregnant      Passed - Valid encounter within last 12 months    Recent Outpatient Visits          3 months ago Essential  hypertension   [place], Laura H, MD   5 months ago Essential hypertension   [facility] Gle[facility] Hess, MD   7 months ago Depression, major, single episode, in partial remission [facility])   [facility] Glean [facility], [facility]   11 months ago Annual physical exam   [employer] Glean Hess, MD   1 year ago Anterior chest wall pain   [facility], MD      Future Appointments            In 3 weeks Army Melia Jesse Sans, MD [facility], [facility]  mirtazapine (REMERON) 30 MG tablet [Pharmacy Med Name: MIRTAZAPINE 30 MG TABLET] 90 tablet 3    Sig: TAKE 1 TABLET BY MOUTH EVERYDAY AT BEDTIME     Psychiatry: Antidepressants - mirtazapine Passed - 07/12/2021  7:50 PM      Passed - Completed PHQ-2 or PHQ-9 in the last 360 days      Passed - Valid encounter within last 6 months    Recent Outpatient Visits          3 months ago Essential hypertension   [facility], [facility]   5 months ago Essential hypertension   [facility] Gle[facility], MD   7 months ago Depression, major, single episode, in partial remission [facility])   [facility] Glean [facility], MD   11 months ago Annual physical exam   [facility], [facility]   1 year ago Anterior chest wall pain   [facility] Glean [facility], [facility]      Future Appointments            In 3 weeks Glean Hess, MD [facility]            albuterol (VENTOLIN HFA) 108 (90 Base) MCG/ACT inhaler [Pharmacy Med Name: ALBUTEROL HFA (PROAIR) INHALER] 8.5 each 0    Sig: TAKE 2 PUFFS BY MOUTH EVERY 6 HOURS AS NEEDED FOR WHEEZE OR SHORTNESS OF BREATH     Pulmonology:  Beta Agonists 2 Failed - 07/12/2021  7:50 PM      Failed - Last BP in normal range    BP Readings from Last 1 Encounters:  06/29/21 (!) 156/68         Passed - Last Heart Rate in normal range     Pulse Readings from Last 1 Encounters:  06/29/21 79         Passed - Valid encounter within last 12 months    Recent Outpatient Visits          3 months ago Essential hypertension   [facility] Gle[facility], [facility]   5 months ago Essential hypertension   [facility] Gle[facility], MD   7 months ago Depression, major, single episode, in partial remission [facility])   [facility] Glean [facility], [facility]   11 months ago Annual physical exam   [facility] Glean Hess, MD   1 year ago Anterior chest wall pain   [facility] Glean Hess, MD      Future Appointments            In 3 weeks Glean Hess, MD [facility]            lisinopril (ZESTRIL) 10 MG tablet [Pharmacy Med Name: LISINOPRIL 10 MG TABLET] 90 tablet 1    Sig: TAKE 1 TABLET BY MOUTH EVERY DAY     Cardiovascular:  ACE Inhibitors Failed - 07/12/2021  7:50 PM      Failed - Cr in normal range and within 180 days    Creatinine  Date Value Ref Range Status  02/23/2013 0.45 (L) 0.60 - 1.30 mg/dL Final   Creatinine, Ser  Date Value Ref Range Status  08/01/2020 0.49 0.44 - 1.00 mg/dL Final         Failed - K in normal range and within 180 days    Potassium  Date Value Ref Range Status  08/01/2020 4.5  3.5 - 5.1 mmol/L Final  02/23/2013 4.1 3.5 - 5.1 mmol/L Final         Failed - Last BP in normal range    BP Readings from Last 1 Encounters:  06/29/21 (!) 156/68         Passed - Patient is not pregnant      Passed - Valid encounter within last 6 months    Recent Outpatient Visits          3 months ago Essential hypertension   [facility] Glean [facility], [facility]   5 months ago Essential hypertension   [facility] Gle[facility], [facility]   7 months ago Depression, major, single episode, in partial remission [facility])   [facility] Glean Hess, MD   11 months ago Annual physical exam   [facility] Glean Hess, MD   1 year ago Anterior chest wall pain   [facility] Glean Hess, MD      Future Appointments            In 3 weeks Glean Hess, MD [facility], [facility]            sertraline (ZOLOFT) 100 MG tablet [Pharmacy Med Name: SERTRALINE HCL 100 MG TABLET] 90 tablet 1    Sig: TAKE 1 TABLET BY MOUTH EVERYDAY AT BEDTIME     Not Delegated - Psychiatry:  Antidepressants - SSRI - sertraline Failed - 07/12/2021  7:50 PM      Failed - This refill cannot be delegated      Passed - AST in normal range and within 360 days    AST  Date Value Ref Range Status  08/01/2020 22 15 - 41 U/L Final   SGOT(AST)  Date Value Ref Range Status  02/01/2013 38 (H) 15 - 37 Unit/L Final         Passed - ALT in normal range and within 360 days    ALT  Date Value Ref Range Status  08/01/2020 15 0 - 44 U/L Final   SGPT (ALT)  Date Value Ref Range Status  02/01/2013 21 12 - 78 U/L Final         Passed - Completed PHQ-2 or PHQ-9 in the last 360 days      Passed - Valid encounter within last 6 months    Recent Outpatient Visits          3 months ago Essential hypertension   [place], Laura H, MD   5 months ago Essential hypertension   [facility] Glean Hess, MD   7 months ago Depression, major, single episode, in partial remission [facility])   [facility] Glean [facility], [facility]   11 months ago Annual physical exam   [facility], [facility]   1 year ago Anterior chest wall pain   [facility] Glean [facility], MD      Future Appointments            In 3 weeks Army Melia Jesse Sans, MD [facility]            traZODone (DESYREL) 50 MG tablet [Pharmacy Med Name: TRAZODONE 50 MG TABLET] 90 tablet 1    Sig: TAKE 1 TABLET BY MOUTH EVERYDAY AT BEDTIME     Psychiatry: Antidepressants - Serotonin Modulator Passed - 07/12/2021  7:50 PM      Passed - Completed  PHQ-2  or PHQ-9 in the last 360 days      Passed - Valid encounter within last 6 months    Recent Outpatient Visits          3 months ago Essential hypertension   [facility] Gle[facility], [facility]   5 months ago Essential hypertension   [facility] Glean Hess, MD   7 months ago Depression, major, single episode, in partial remission [facility])   [facility] Glean Hess, MD   11 months ago Annual physical exam   [facility] Glean Hess, MD   1 year ago Anterior chest wall pain   [facility] Glean Hess, MD      Future Appointments            In 3 weeks Glean Hess, MD [facility], [facility]            clopidogrel (PLAVIX) 75 MG tablet [Pharmacy Med Name: CLOPIDOGREL 75 MG TABLET] 90 tablet 3    Sig: TAKE 1 [place] DAY     Hematology: Antiplatelets - clopidogrel Failed - 07/12/2021  7:50 PM      Failed - HCT in normal range and within 180 days    HCT  Date Value Ref Range Status  08/01/2020 43.0 36.0 - 46.0 % Final   Hematocrit  Date Value Ref Range Status  07/22/2018 40.0 34.0 - 46.6 % Final         Failed - HGB in normal range and within 180 days    Hemoglobin  Date Value Ref Range Status  08/01/2020 13.9 12.0 - 15.0 g/dL Final  07/22/2018 13.5 11.1 - 15.9 g/dL Final         Failed - PLT in normal range and within 180 days    Platelets  Date Value Ref Range Status  08/01/2020 239 150 - 400 K/uL Final  07/22/2018 222 150 - 450 x10E3/uL Final         Passed - Cr in normal range and within 360 days    Creatinine  Date Value Ref Range Status  02/23/2013 0.45 (L) 0.60 - 1.30 mg/dL Final   Creatinine, Ser  Date Value Ref Range Status  08/01/2020 0.49 0.44 - 1.00 mg/dL Final         Passed - Valid encounter within last 6 months    Recent Outpatient Visits          3 months ago Essential hypertension   [place], Laura H, MD   5 months ago Essential  hypertension   [facility] Glean Hess, MD   7 months ago Depression, major, single episode, in partial remission [facility])   [facility] Gle[facility], MD   11 months ago Annual physical exam   [facility] Glean [facility], MD   1 year ago Anterior chest wall pain   [facility] Glean Hess, MD      Future Appointments            In 3 weeks Army Melia Jesse Sans, MD [facility] [place] due soon on some meds, Zoloft not delegated.

## 2021-07-12 NOTE — Telephone Encounter (Signed)
Requested medication (s) are due for refill today: yes  Requested medication (s) are on the active medication list: yes  Last refill:  03/29/21 #60 2 refills  Future visit scheduled: yes in 3 weeks  Notes to clinic:  not delegated per protocol. Do you want to refill Rx?     Requested Prescriptions  Pending Prescriptions Disp Refills   ALPRAZolam (XANAX) 0.25 MG tablet [Pharmacy Med Name: ALPRAZOLAM 0.25 MG TABLET] 60 tablet 2    Sig: TAKE 1 TABLET BY MOUTH 2 TIMES DAILY AS NEEDED.     Not Delegated - Psychiatry: Anxiolytics/Hypnotics 2 Failed - 07/12/2021  8:42 AM      Failed - This refill cannot be delegated      Failed - Urine Drug Screen completed in last 360 days      Passed - Patient is not pregnant      Passed - Valid encounter within last 6 months    Recent Outpatient Visits           3 months ago Essential hypertension   [facility]   5 months ago Essential hypertension   [facility] Glean Hess, MD   7 months ago Depression, major, single episode, in partial remission [facility])   [facility] Glean [facility], [facility]   11 months ago Annual physical exam   [facility], MD   1 year ago Anterior chest wall pain   [facility] Glean Hess, MD       Future Appointments             In 3 weeks Army Melia Jesse Sans, MD [facility], [facility]

## 2021-07-26 DIAGNOSIS — I69354 Hemiplegia and hemiparesis following cerebral infarction affecting left non-dominant side: Secondary | ICD-10-CM | POA: Diagnosis not present

## 2021-07-26 DIAGNOSIS — F3341 Major depressive disorder, recurrent, in partial remission: Secondary | ICD-10-CM | POA: Diagnosis not present

## 2021-07-26 DIAGNOSIS — J449 Chronic obstructive pulmonary disease, unspecified: Secondary | ICD-10-CM | POA: Diagnosis not present

## 2021-07-26 DIAGNOSIS — I739 Peripheral vascular disease, unspecified: Secondary | ICD-10-CM | POA: Diagnosis not present

## 2021-07-26 DIAGNOSIS — M461 Sacroiliitis, not elsewhere classified: Secondary | ICD-10-CM | POA: Diagnosis not present

## 2021-07-26 DIAGNOSIS — D692 Other nonthrombocytopenic purpura: Secondary | ICD-10-CM | POA: Diagnosis not present

## 2021-07-26 DIAGNOSIS — Z87891 Personal history of nicotine dependence: Secondary | ICD-10-CM | POA: Diagnosis not present

## 2021-07-26 DIAGNOSIS — Z7902 Long term (current) use of antithrombotics/antiplatelets: Secondary | ICD-10-CM | POA: Diagnosis not present

## 2021-08-03 ENCOUNTER — Encounter: Payer: Self-pay | Admitting: Internal Medicine

## 2021-08-03 ENCOUNTER — Other Ambulatory Visit: Payer: Self-pay

## 2021-08-03 ENCOUNTER — Other Ambulatory Visit
Admission: RE | Admit: 2021-08-03 | Discharge: 2021-08-03 | Disposition: A | Discharge status: Home / Self Care | Payer: PPO | Attending: Internal Medicine | Admitting: Internal Medicine

## 2021-08-03 ENCOUNTER — Ambulatory Visit (INDEPENDENT_AMBULATORY_CARE_PROVIDER_SITE_OTHER): Payer: PPO | Admitting: Internal Medicine

## 2021-08-03 VITALS — BP 122/78 | HR 94 | Ht 61.0 in | Wt 133.0 lb

## 2021-08-03 DIAGNOSIS — F324 Major depressive disorder, single episode, in partial remission: Secondary | ICD-10-CM | POA: Diagnosis not present

## 2021-08-03 DIAGNOSIS — I10 Essential (primary) hypertension: Secondary | ICD-10-CM | POA: Insufficient documentation

## 2021-08-03 DIAGNOSIS — E785 Hyperlipidemia, unspecified: Secondary | ICD-10-CM | POA: Diagnosis not present

## 2021-08-03 DIAGNOSIS — K2100 Gastro-esophageal reflux disease with esophagitis, without bleeding: Secondary | ICD-10-CM

## 2021-08-03 DIAGNOSIS — I69359 Hemiplegia and hemiparesis following cerebral infarction affecting unspecified side: Secondary | ICD-10-CM | POA: Diagnosis not present

## 2021-08-03 DIAGNOSIS — Z0000 Encounter for general adult medical examination without abnormal findings: Secondary | ICD-10-CM | POA: Diagnosis not present

## 2021-08-03 DIAGNOSIS — D6869 Other thrombophilia: Secondary | ICD-10-CM | POA: Diagnosis not present

## 2021-08-03 LAB — POCT URINALYSIS DIPSTICK
Bilirubin, UA: NEGATIVE
Blood, UA: NEGATIVE
Glucose, UA: NEGATIVE
Ketones, UA: NEGATIVE
Leukocytes, UA: NEGATIVE
Nitrite, UA: NEGATIVE
Protein, UA: NEGATIVE
Spec Grav, UA: 1.01 (ref 1.010–1.025)
Urobilinogen, UA: 0.2 E.U./dL
pH, UA: 6 (ref 5.0–8.0)

## 2021-08-03 LAB — CBC WITH DIFFERENTIAL/PLATELET
Abs Immature Granulocytes: 0.02 10*3/uL (ref 0.00–0.07)
Basophils Absolute: 0.1 10*3/uL (ref 0.0–0.1)
Basophils Relative: 1 %
Eosinophils Absolute: 0.1 10*3/uL (ref 0.0–0.5)
Eosinophils Relative: 3 %
HCT: 37.6 % (ref 36.0–46.0)
Hemoglobin: 12.9 g/dL (ref 12.0–15.0)
Immature Granulocytes: 0 %
Lymphocytes Relative: 29 %
Lymphs Abs: 1.6 10*3/uL (ref 0.7–4.0)
MCH: 34.2 pg — ABNORMAL HIGH (ref 26.0–34.0)
MCHC: 34.3 g/dL (ref 30.0–36.0)
MCV: 99.7 fL (ref 80.0–100.0)
Monocytes Absolute: 0.4 10*3/uL (ref 0.1–1.0)
Monocytes Relative: 8 %
Neutro Abs: 3.1 10*3/uL (ref 1.7–7.7)
Neutrophils Relative %: 59 %
Platelets: 257 10*3/uL (ref 150–400)
RBC: 3.77 MIL/uL — ABNORMAL LOW (ref 3.87–5.11)
RDW: 11.8 % (ref 11.5–15.5)
WBC: 5.3 10*3/uL (ref 4.0–10.5)
nRBC: 0 % (ref 0.0–0.2)

## 2021-08-03 LAB — COMPREHENSIVE METABOLIC PANEL
ALT: 12 U/L (ref 0–44)
AST: 22 U/L (ref 15–41)
Albumin: 4.2 g/dL (ref 3.5–5.0)
Alkaline Phosphatase: 52 U/L (ref 38–126)
Anion gap: 10 (ref 5–15)
BUN: 9 mg/dL (ref 8–23)
CO2: 25 mmol/L (ref 22–32)
Calcium: 9.4 mg/dL (ref 8.9–10.3)
Chloride: 97 mmol/L — ABNORMAL LOW (ref 98–111)
Creatinine, Ser: 0.5 mg/dL (ref 0.44–1.00)
GFR, Estimated: >60 mL/min (ref >60)
Glucose, Bld: 91 mg/dL (ref 70–99)
Potassium: 4.7 mmol/L (ref 3.5–5.1)
Sodium: 132 mmol/L — ABNORMAL LOW (ref 135–145)
Total Bilirubin: 0.3 mg/dL (ref 0.3–1.2)
Total Protein: 7.5 g/dL (ref 6.5–8.1)

## 2021-08-03 LAB — TSH: TSH: 1.051 u[IU]/mL (ref 0.350–4.500)

## 2021-08-03 LAB — LIPID PANEL
Cholesterol: 164 mg/dL (ref 0–200)
HDL: 94 mg/dL (ref >40)
LDL Cholesterol: 36 mg/dL (ref 0–99)
Total CHOL/HDL Ratio: 1.7 RATIO
Triglycerides: 172 mg/dL — ABNORMAL HIGH (ref <150)
VLDL: 34 mg/dL (ref 0–40)
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
## 2021-08-04 ENCOUNTER — Other Ambulatory Visit: Payer: Self-pay | Admitting: Internal Medicine

## 2021-08-04 NOTE — Telephone Encounter (Signed)
Requested medication (s) are due for refill today - yes ? ?Requested medication (s) are on the active medication list -yes ? ?Future visit scheduled -yes ? ?Last refill: 07/12/21 8.5 ? ?Notes to clinic: Rx request: Not addressed at appointment yesterday- ? diagnosis/long term use ? ?Requested Prescriptions  ?Pending Prescriptions Disp Refills  ? albuterol (VENTOLIN HFA) 108 (90 Base) MCG/ACT inhaler [Pharmacy Med Name: ALBUTEROL HFA (PROAIR) INHALER] 8.5 each 0  ?  Sig: TAKE 2 PUFFS BY MOUTH EVERY 6 HOURS AS NEEDED FOR WHEEZE OR SHORTNESS OF BREATH  ?  ? Pulmonology:  Beta Agonists 2 Passed - 08/04/2021  1:42 AM  ?  ?  Passed - Last BP in normal range  ?  BP Readings from Last 1 Encounters:  ?08/03/21 122/78  ?  ?  ?  ?  Passed - Last Heart Rate in normal range  ?  Pulse Readings from Last 1 Encounters:  ?08/03/21 94  ?  ?  ?  ?  Passed - Valid encounter within last 12 months  ?  Recent Outpatient Visits   ? ?      ? Yesterday Annual physical exam  ? [facility] Glean Hess, [facility]  ? 4 months ago Essential hypertension  ? [facility] Glean Hess, MD  ? 6 months ago Essential hypertension  ? [facility] Glean Hess, [facility]  ? 8 months ago Depression, major, single episode, in partial remission [facility])  ? [facility] Glean Hess, MD  ? 1 year ago Annual physical exam  ? [facility] Glean Hess, MD  ? ?  ?  ?Future Appointments   ? ?        ? In 6 months Army Melia Jesse Sans, MD [facility] [place]  ? ?  ? ?  ?  ?  ? ? ? ?Requested Prescriptions  ?Pending Prescriptions Disp Refills  ? albuterol (VENTOLIN HFA) 108 (90 Base) MCG/ACT inhaler [Pharmacy Med Name: ALBUTEROL HFA (PROAIR) INHALER] 8.5 each 0  ?  Sig: TAKE 2 PUFFS BY MOUTH EVERY 6 HOURS AS NEEDED FOR WHEEZE OR SHORTNESS OF BREATH  ?  ? Pulmonology:  Beta Agonists 2 Passed - 08/04/2021  1:42 AM  ?  ?  Passed - Last BP in normal range  ?  BP Readings from Last 1 Encounters:  ?08/03/21 122/78  ?   ?  ?  ?  Passed - Last Heart Rate in normal range  ?  Pulse Readings from Last 1 Encounters:  ?08/03/21 94  ?  ?  ?  ?  Passed - Valid encounter within last 12 months  ?  Recent Outpatient Visits   ? ?      ? Yesterday Annual physical exam  ? [facility] Glean Hess, MD  ? 4 months ago Essential hypertension  ? [facility] Glean [facility], MD  ? 6 months ago Essential hypertension  ? [facility] Glean Hess, [facility]  ? 8 months ago Depression, major, single episode, in partial remission [facility])  ? [facility] Glean Hess, MD  ? 1 year ago Annual physical exam  ? [facility] Glean Hess, MD  ? ?  ?  ?Future Appointments   ? ?        ? In 6 months Army Melia Jesse Sans, MD [facility] [place]  ? ?  ? ?  ?  ?  ? ? ? ?

## 2021-08-31 ENCOUNTER — Other Ambulatory Visit: Payer: Self-pay | Admitting: Internal Medicine

## 2021-08-31 NOTE — Telephone Encounter (Signed)
Requested Prescriptions  ?Pending Prescriptions Disp Refills  ?? albuterol (VENTOLIN HFA) 108 (90 Base) MCG/ACT inhaler [Pharmacy Med Name: ALBUTEROL HFA (PROAIR) INHALER] 8.5 each 0  ?  Sig: TAKE 2 PUFFS BY MOUTH EVERY 6 HOURS AS NEEDED FOR WHEEZE OR SHORTNESS OF BREATH  ?  ? Pulmonology:  Beta Agonists 2 Passed - 08/31/2021  1:47 AM  ?  ?  Passed - Last BP in normal range  ?  BP Readings from Last 1 Encounters:  ?08/03/21 122/78  ?   ?  ?  Passed - Last Heart Rate in normal range  ?  Pulse Readings from Last 1 Encounters:  ?08/03/21 94  ?   ?  ?  Passed - Valid encounter within last 12 months  ?  Recent Outpatient Visits   ?      ? 4 weeks ago Annual physical exam  ? [facility] Glean Hess, MD  ? 5 months ago Essential hypertension  ? [facility] Glean Hess, MD  ? 7 months ago Essential hypertension  ? [facility] Glean Hess, [facility]  ? 9 months ago Depression, major, single episode, in partial remission [facility])  ? [facility] Glean Hess, MD  ? 1 year ago Annual physical exam  ? [facility] Glean Hess, MD  ?  ?  ?Future Appointments   ?        ? In 5 months Army Melia Jesse Sans, MD [facility] [place]  ?  ? ?  ?  ?  ? ?

## 2021-09-23 ENCOUNTER — Other Ambulatory Visit: Payer: Self-pay | Admitting: Internal Medicine

## 2021-09-23 DIAGNOSIS — E785 Hyperlipidemia, unspecified: Secondary | ICD-10-CM

## 2021-09-23 DIAGNOSIS — I10 Essential (primary) hypertension: Secondary | ICD-10-CM

## 2021-09-23 DIAGNOSIS — F324 Major depressive disorder, single episode, in partial remission: Secondary | ICD-10-CM

## 2021-09-23 DIAGNOSIS — K2100 Gastro-esophageal reflux disease with esophagitis, without bleeding: Secondary | ICD-10-CM

## 2021-09-25 NOTE — Telephone Encounter (Signed)
Requested Prescriptions  ?Pending Prescriptions Disp Refills  ?? albuterol (VENTOLIN HFA) 108 (90 Base) MCG/ACT inhaler [Pharmacy Med Name: ALBUTEROL HFA (PROAIR) INHALER] 8.5 each 0  ?  Sig: TAKE 2 PUFFS BY MOUTH EVERY 6 HOURS AS NEEDED FOR WHEEZE OR SHORTNESS OF BREATH  ?  ? Pulmonology:  Beta Agonists 2 Passed - 09/23/2021 11:02 AM  ?  ?  Passed - Last BP in normal range  ?  BP Readings from Last 1 Encounters:  ?08/03/21 122/78  ?   ?  ?  Passed - Last Heart Rate in normal range  ?  Pulse Readings from Last 1 Encounters:  ?08/03/21 94  ?   ?  ?  Passed - Valid encounter within last 12 months  ?  Recent Outpatient Visits   ?      ? 1 month ago Annual physical exam  ? [facility] Glean Hess, MD  ? 6 months ago Essential hypertension  ? [facility] Glean Hess, MD  ? 8 months ago Essential hypertension  ? [facility] Glean Hess, MD  ? 10 months ago Depression, major, single episode, in partial remission [facility])  ? [facility] Glean Hess, MD  ? 1 year ago Annual physical exam  ? [facility] Glean Hess, MD  ?  ?  ?Future Appointments   ?        ? In 4 months Glean Hess, MD [facility]  ?  ? ?  ?  ?  ?? sertraline (ZOLOFT) 100 MG tablet [Pharmacy Med Name: SERTRALINE HCL 100 MG TABLET] 90 tablet 1  ?  Sig: TAKE 1 TABLET BY MOUTH EVERYDAY AT BEDTIME  ?  ? Not Delegated - Psychiatry:  Antidepressants - SSRI - sertraline Failed - 09/23/2021 11:02 AM  ?  ?  Failed - This refill cannot be delegated  ?  ?  Passed - AST in normal range and within 360 days  ?  AST  ?Date Value Ref Range Status  ?08/03/2021 22 15 - 41 U/L Final  ? ?SGOT(AST)  ?Date Value Ref Range Status  ?02/01/2013 38 (H) 15 - 37 Unit/L Final  ?   ?  ?  Passed - ALT in normal range and within 360 days  ?  ALT  ?Date Value Ref Range Status  ?08/03/2021 12 0 - 44 U/L Final  ? ?SGPT (ALT)  ?Date Value Ref Range Status  ?02/01/2013 21 12 - 78 U/L Final  ?   ?  ?   Passed - Completed PHQ-2 or PHQ-9 in the last 360 days  ?  ?  Passed - Valid encounter within last 6 months  ?  Recent Outpatient Visits   ?      ? 1 month ago Annual physical exam  ? [facility] Glean Hess, MD  ? 6 months ago Essential hypertension  ? [facility] Glean [facility], [facility]  ? 8 months ago Essential hypertension  ? [facility] Glean Hess, MD  ? 10 months ago Depression, major, single episode, in partial remission [facility])  ? [facility] Glean Hess, MD  ? 1 year ago Annual physical exam  ? [facility] Glean Hess, MD  ?  ?  ?Future Appointments   ?        ? In 4 months Army Melia Jesse Sans, MD [facility] [place]  ?  ? ?  ?  ?  ??  pantoprazole (PROTONIX) 40 MG tablet [Pharmacy Med Name: PANTOPRAZOLE SOD DR 40 MG TAB] 180 tablet 1  ?  Sig: TAKE 1 TABLET BY MOUTH TWICE A DAY  ?  ? Gastroenterology: Proton Pump Inhibitors Passed - 09/23/2021 11:02 AM  ?  ?  Passed - Valid encounter within last 12 months  ?  Recent Outpatient Visits   ?      ? 1 month ago Annual physical exam  ? [facility] Gle[facility], [facility]  ? 6 months ago Essential hypertension  ? [facility] Glean Hess, MD  ? 8 months ago Essential hypertension  ? [facility] Glean Hess, MD  ? 10 months ago Depression, major, single episode, in partial remission [facility])  ? [facility] Glean Hess, MD  ? 1 year ago Annual physical exam  ? [facility] Glean Hess, MD  ?  ?  ?Future Appointments   ?        ? In 4 months Glean Hess, MD [facility]  ?  ? ?  ?  ?  ?? lisinopril (ZESTRIL) 10 MG tablet [Pharmacy Med Name: LISINOPRIL 10 MG TABLET] 90 tablet 0  ?  Sig: TAKE 1 TABLET BY MOUTH EVERY DAY  ?  ? Cardiovascular:  ACE Inhibitors Passed - 09/23/2021 11:02 AM  ?  ?  Passed - Cr in normal range and within 180 days  ?  Creatinine  ?Date Value Ref Range Status  ?02/23/2013 0.45 (L) 0.60 -  1.30 mg/dL Final  ? ?Creatinine, Ser  ?Date Value Ref Range Status  ?08/03/2021 0.50 0.44 - 1.00 mg/dL Final  ?   ?  ?  Passed - K in normal range and within 180 days  ?  Potassium  ?Date Value Ref Range Status  ?08/03/2021 4.7 3.5 - 5.1 mmol/L Final  ?02/23/2013 4.1 3.5 - 5.1 mmol/L Final  ?   ?  ?  Passed - Patient is not pregnant  ?  ?  Passed - Last BP in normal range  ?  BP Readings from Last 1 Encounters:  ?08/03/21 122/78  ?   ?  ?  Passed - Valid encounter within last 6 months  ?  Recent Outpatient Visits   ?      ? 1 month ago Annual physical exam  ? [facility] Glean Hess, MD  ? 6 months ago Essential hypertension  ? [facility], [facility]  ? 8 months ago Essential hypertension  ? [facility] Gle[facility], [facility]  ? 10 months ago Depression, major, single episode, in partial remission [facility])  ? [facility] Glean Hess, MD  ? 1 year ago Annual physical exam  ? [facility] Glean Hess, MD  ?  ?  ?Future Appointments   ?        ? In 4 months Glean Hess, MD [facility]  ?  ? ?  ?  ?  ?? simvastatin (ZOCOR) 40 MG tablet [Pharmacy Med Name: SIMVASTATIN 40 MG TABLET] 90 tablet 0  ?  Sig: TAKE 1 TABLET BY MOUTH DAILY AT 6 PM.  ?  ? Cardiovascular:  Antilipid - Statins Failed - 09/23/2021 11:02 AM  ?  ?  Failed - Lipid Panel in normal range within the last 12 months  ?  Cholesterol, Total  ?Date Value Ref Range Status  ?07/22/2018 223 (  H) 100 - 199 mg/dL Final  ? ?Cholesterol  ?Date Value Ref Range Status  ?08/03/2021 164 0 - 200 mg/dL Final  ?02/01/2013 168 0 - 200 mg/dL Final  ? ?Ldl Cholesterol, Calc  ?Date Value Ref Range Status  ?02/01/2013 87 0 - 100 mg/dL Final  ? ?LDL Calculated  ?Date Value Ref Range Status  ?07/22/2018 96 0 - 99 mg/dL Final  ? ?LDL Cholesterol  ?Date Value Ref Range Status  ?08/03/2021 36 0 - 99 mg/dL Final  ?  Comment:  ?         ?Total Cholesterol/HDL:CHD Risk ?Coronary Heart Disease  Risk Table ?                    Men   Women ? 1/2 Average Risk   3.4   3.3 ? Average Risk       5.0   4.4 ? 2 X Average Risk   9.6   7.1 ? 3 X Average Risk  23.4   11.0 ?       ?Use the calculated Patient Ratio ?above and the CHD Risk Table ?to determine the patient's CHD Risk. ?       ?ATP III CLASSIFICATION (LDL): ? <100     mg/dL   Optimal ? 100-129  mg/dL   Near or Above ?                   Optimal ? 130-159  mg/dL   Borderline ? 160-189  mg/dL   High ? >190     mg/dL   Very High ?Performed at [facility], [address] [place], [place] [address] ?  ? ?HDL Cholesterol  ?Date Value Ref Range Status  ?02/01/2013 67 (H) 40 - 60 mg/dL Final  ? ?HDL  ?Date Value Ref Range Status  ?08/03/2021 94 >40 mg/dL Final  ?07/22/2018 103 >39 mg/dL Final  ? ?Triglycerides  ?Date Value Ref Range Status  ?08/03/2021 172 (H) <150 mg/dL Final  ?02/01/2013 70 0 - 200 mg/dL Final  ? ?  ?  ?  Passed - Patient is not pregnant  ?  ?  Passed - Valid encounter within last 12 months  ?  Recent Outpatient Visits   ?      ? 1 month ago Annual physical exam  ? [facility] Glean Hess, MD  ? 6 months ago Essential hypertension  ? [facility] Glean Hess, MD  ? 8 months ago Essential hypertension  ? [facility] Glean Hess, MD  ? 10 months ago Depression, major, single episode, in partial remission [facility])  ? [facility] Glean [facility], MD  ? 1 year ago Annual physical exam  ? [facility] Glean Hess, MD  ?  ?  ?Future Appointments   ?        ? In 4 months Army Melia Jesse Sans, MD [facility] [place]  ?  ? ?  ?  ?  ? ?

## 2021-10-09 ENCOUNTER — Telehealth: Payer: Self-pay | Admitting: Internal Medicine

## 2021-10-09 DIAGNOSIS — F411 Generalized anxiety disorder: Secondary | ICD-10-CM

## 2021-10-09 DIAGNOSIS — F324 Major depressive disorder, single episode, in partial remission: Secondary | ICD-10-CM

## 2021-10-10 DIAGNOSIS — H6091 Unspecified otitis externa, right ear: Secondary | ICD-10-CM | POA: Diagnosis not present

## 2021-10-10 NOTE — Telephone Encounter (Signed)
Requested medication (s) are due for refill today: yes ? ?Requested medication (s) are on the active medication list: yes ? ?Last refill:  07/12/21 #60 2 RF ? ?Future visit scheduled: yes ? ?Notes to clinic:  med not delegated to NT to RF ? ? ?Requested Prescriptions  ?Pending Prescriptions Disp Refills  ? ALPRAZolam (XANAX) 0.25 MG tablet [Pharmacy Med Name: ALPRAZOLAM 0.25 MG TABLET] 60 tablet 2  ?  Sig: TAKE 1 TABLET BY MOUTH TWICE A DAY AS NEEDED  ?  ? Not Delegated - Psychiatry: Anxiolytics/Hypnotics 2 Failed - 10/09/2021  2:33 PM  ?  ?  Failed - This refill cannot be delegated  ?  ?  Failed - Urine Drug Screen completed in last 360 days  ?  ?  Passed - Patient is not pregnant  ?  ?  Passed - Valid encounter within last 6 months  ?  Recent Outpatient Visits   ? ?      ? 2 months ago Annual physical exam  ? [facility] Glean Hess, MD  ? 6 months ago Essential hypertension  ? [facility] Glean Hess, MD  ? 8 months ago Essential hypertension  ? [facility] Glean Hess, MD  ? 10 months ago Depression, major, single episode, in partial remission [facility])  ? [facility] Glean Hess, MD  ? 1 year ago Annual physical exam  ? [facility] Glean Hess, MD  ? ?  ?  ?Future Appointments   ? ?        ? In 3 months Army Melia Jesse Sans, MD [facility], [place]  ? ?  ? ? ?  ?  ?  ? ? ? ? ?

## 2021-10-10 NOTE — Telephone Encounter (Signed)
Please review. Last office visit 08/03/21. ? ?KP

## 2021-10-17 ENCOUNTER — Other Ambulatory Visit: Payer: Self-pay | Admitting: Internal Medicine

## 2021-10-17 DIAGNOSIS — F324 Major depressive disorder, single episode, in partial remission: Secondary | ICD-10-CM

## 2021-10-17 DIAGNOSIS — F411 Generalized anxiety disorder: Secondary | ICD-10-CM

## 2021-10-17 MED ORDER — ALPRAZOLAM 0.25 MG PO TABS: 0.2500 mg | ORAL_TABLET | Freq: Two times a day (BID) | ORAL | 0 refills | Status: DC | PRN

## 2021-10-17 NOTE — Telephone Encounter (Signed)
Please review. Pts X[facility]X 0.[facility] MG [facility]/ pt asked if RX can be resent to [facility] due to them having it in stock / please advise/ pt asked if this can be sent today  [place]. ? ?KP

## 2021-10-17 NOTE — Telephone Encounter (Signed)
Pts refills for LPRAZolam Duanne Moron) 0.25 MG [facility]/ pt asked if RX can be resent to [facility] due to them having it in stock / please advise/ pt asked if this can be sent today  ?[facility] DRUG STORE [place] AT [facility] [address] [place] Phone:  [phone]  ?Fax:  [phone]  ?  ? ? ?

## 2021-10-18 ENCOUNTER — Other Ambulatory Visit: Payer: Self-pay | Admitting: Internal Medicine

## 2021-10-18 NOTE — Telephone Encounter (Signed)
Requested Prescriptions  ?Pending Prescriptions Disp Refills  ?? albuterol (VENTOLIN HFA) 108 (90 Base) MCG/ACT inhaler [Pharmacy Med Name: ALBUTEROL HFA (PROAIR) INHALER] 8.5 each 0  ?  Sig: TAKE 2 PUFFS BY MOUTH EVERY 6 HOURS AS NEEDED FOR WHEEZE OR SHORTNESS OF BREATH  ?  ? Pulmonology:  Beta Agonists 2 Passed - 10/18/2021  1:54 AM  ?  ?  Passed - Last BP in normal range  ?  BP Readings from Last 1 Encounters:  ?08/03/21 122/78  ?   ?  ?  Passed - Last Heart Rate in normal range  ?  Pulse Readings from Last 1 Encounters:  ?08/03/21 94  ?   ?  ?  Passed - Valid encounter within last 12 months  ?  Recent Outpatient Visits   ?      ? 2 months ago Annual physical exam  ? [facility] Glean Hess, MD  ? 6 months ago Essential hypertension  ? [facility] Glean Hess, MD  ? 8 months ago Essential hypertension  ? [facility] Glean Hess, MD  ? 11 months ago Depression, major, single episode, in partial remission [facility])  ? [facility] Glean Hess, MD  ? 1 year ago Annual physical exam  ? [facility] Glean Hess, MD  ?  ?  ?Future Appointments   ?        ? In 3 months Army Melia Jesse Sans, MD [facility], [place]  ?  ? ?  ?  ?  ? ?

## 2021-11-16 DIAGNOSIS — Z87891 Personal history of nicotine dependence: Secondary | ICD-10-CM | POA: Diagnosis not present

## 2021-11-16 DIAGNOSIS — Z9989 Dependence on other enabling machines and devices: Secondary | ICD-10-CM | POA: Diagnosis not present

## 2021-11-16 DIAGNOSIS — I10 Essential (primary) hypertension: Secondary | ICD-10-CM | POA: Diagnosis not present

## 2021-11-16 DIAGNOSIS — J449 Chronic obstructive pulmonary disease, unspecified: Secondary | ICD-10-CM | POA: Diagnosis not present

## 2021-11-16 DIAGNOSIS — I69354 Hemiplegia and hemiparesis following cerebral infarction affecting left non-dominant side: Secondary | ICD-10-CM | POA: Diagnosis not present

## 2021-12-21 ENCOUNTER — Other Ambulatory Visit: Payer: Self-pay | Admitting: Internal Medicine

## 2021-12-21 DIAGNOSIS — I10 Essential (primary) hypertension: Secondary | ICD-10-CM

## 2021-12-21 DIAGNOSIS — E785 Hyperlipidemia, unspecified: Secondary | ICD-10-CM

## 2021-12-21 NOTE — Telephone Encounter (Signed)
Requested Prescriptions  Pending Prescriptions Disp Refills  . simvastatin (ZOCOR) 40 MG tablet [Pharmacy Med Name: SIMVASTATIN 40 MG TABLET] 90 tablet 0    Sig: TAKE 1 TABLET BY MOUTH DAILY AT 6 PM.     Cardiovascular:  Antilipid - Statins Failed - 12/21/2021  2:15 AM      Failed - Lipid Panel in normal range within the last 12 months    Cholesterol, Total  Date Value Ref Range Status  07/22/2018 223 (H) 100 - 199 mg/dL Final   Cholesterol  Date Value Ref Range Status  08/03/2021 164 0 - 200 mg/dL Final  02/01/2013 168 0 - 200 mg/dL Final   Ldl Cholesterol, Calc  Date Value Ref Range Status  02/01/2013 87 0 - 100 mg/dL Final   LDL Calculated  Date Value Ref Range Status  07/22/2018 96 0 - 99 mg/dL Final   LDL Cholesterol  Date Value Ref Range Status  08/03/2021 36 0 - 99 mg/dL Final    Comment:           Total Cholesterol/HDL:CHD Risk Coronary Heart Disease Risk Table                     Men   Women  1/2 Average Risk   3.4   3.3  Average Risk       5.0   4.4  2 X Average Risk   9.6   7.1  3 X Average Risk  23.4   11.0        Use the calculated Patient Ratio above and the CHD Risk Table to determine the patient's CHD Risk.        ATP III CLASSIFICATION (LDL):  <100     mg/dL   Optimal  100-129  mg/dL   Near or Above                    Optimal  130-159  mg/dL   Borderline  160-189  mg/dL   High  >190     mg/dL   Very High Performed at [facility], [place], [place] [address]    HDL Cholesterol  Date Value Ref Range Status  02/01/2013 67 (H) 40 - 60 mg/dL Final   HDL  Date Value Ref Range Status  08/03/2021 94 >40 mg/dL Final  07/22/2018 103 >39 mg/dL Final   Triglycerides  Date Value Ref Range Status  08/03/2021 172 (H) <150 mg/dL Final  02/01/2013 70 0 - 200 mg/dL Final         Passed - Patient is not pregnant      Passed - Valid encounter within last 12 months    Recent Outpatient Visits          4 months ago Annual  physical exam   [facility] Glean Hess, MD   8 months ago Essential hypertension   [facility] Glean Hess, MD   10 months ago Essential hypertension   [facility] Glean Hess, MD   1 year ago Depression, major, single episode, in partial remission [facility])   [facility] Glean Hess, MD   1 year ago Annual physical exam   [facility] Glean Hess, MD      Future Appointments            In 1 month Army Melia Jesse Sans, MD [facility], [place]           .  lisinopril (ZESTRIL) 10 MG tablet [Pharmacy Med Name: LISINOPRIL 10 MG TABLET] 90 tablet 0    Sig: TAKE 1 TABLET BY MOUTH EVERY DAY     Cardiovascular:  ACE Inhibitors Passed - 12/21/2021  2:15 AM      Passed - Cr in normal range and within 180 days    Creatinine  Date Value Ref Range Status  02/23/2013 0.45 (L) 0.60 - 1.30 mg/dL Final   Creatinine, Ser  Date Value Ref Range Status  08/03/2021 0.50 0.44 - 1.00 mg/dL Final         Passed - K in normal range and within 180 days    Potassium  Date Value Ref Range Status  08/03/2021 4.7 3.5 - 5.1 mmol/L Final  02/23/2013 4.1 3.5 - 5.1 mmol/L Final         Passed - Patient is not pregnant      Passed - Last BP in normal range    BP Readings from Last 1 Encounters:  08/03/21 122/78         Passed - Valid encounter within last 6 months    Recent Outpatient Visits          4 months ago Annual physical exam   [facility] Glean Hess, MD   8 months ago Essential hypertension   [facility] Glean Hess, MD   10 months ago Essential hypertension   [facility] Glean Hess, MD   1 year ago Depression, major, single episode, in partial remission [facility])   [facility] Glean Hess, MD   1 year ago Annual physical exam   [facility] Glean Hess, MD      Future Appointments            In 1 month Army Melia Jesse Sans, MD  [facility], [facility]

## 2022-01-05 ENCOUNTER — Other Ambulatory Visit: Payer: Self-pay | Admitting: Internal Medicine

## 2022-01-05 DIAGNOSIS — F324 Major depressive disorder, single episode, in partial remission: Secondary | ICD-10-CM

## 2022-01-05 NOTE — Telephone Encounter (Signed)
Requested Prescriptions  Pending Prescriptions Disp Refills  . traZODone (DESYREL) 50 MG tablet [Pharmacy Med Name: TRAZODONE 50 MG TABLET] 90 tablet 0    Sig: TAKE 1 TABLET BY MOUTH EVERYDAY AT BEDTIME     Psychiatry: Antidepressants - Serotonin Modulator Passed - 01/05/2022  2:24 AM      Passed - Completed PHQ-2 or PHQ-9 in the last 360 days      Passed - Valid encounter within last 6 months    Recent Outpatient Visits          5 months ago Annual physical exam   [facility] Glean Hess, MD   9 months ago Essential hypertension   [place], [place]   11 months ago Essential hypertension   [facility] Glean Hess, MD   1 year ago Depression, major, single episode, in partial remission [facility])   [facility] Glean Hess, MD   1 year ago Annual physical exam   [facility] Glean Hess, MD      Future Appointments            In 1 month Army Melia Jesse Sans, MD [facility], [facility]

## 2022-01-19 DIAGNOSIS — Z961 Presence of intraocular lens: Secondary | ICD-10-CM | POA: Diagnosis not present

## 2022-01-19 DIAGNOSIS — H35372 Puckering of macula, left eye: Secondary | ICD-10-CM | POA: Diagnosis not present

## 2022-01-19 DIAGNOSIS — H04123 Dry eye syndrome of bilateral lacrimal glands: Secondary | ICD-10-CM | POA: Diagnosis not present

## 2022-02-06 ENCOUNTER — Encounter: Payer: Self-pay | Admitting: Internal Medicine

## 2022-02-06 ENCOUNTER — Ambulatory Visit (INDEPENDENT_AMBULATORY_CARE_PROVIDER_SITE_OTHER): Payer: PPO | Admitting: Internal Medicine

## 2022-02-06 VITALS — BP 124/80 | HR 89 | Ht 61.0 in | Wt 136.6 lb

## 2022-02-06 DIAGNOSIS — K2100 Gastro-esophageal reflux disease with esophagitis, without bleeding: Secondary | ICD-10-CM

## 2022-02-06 DIAGNOSIS — I10 Essential (primary) hypertension: Secondary | ICD-10-CM

## 2022-02-06 DIAGNOSIS — F324 Major depressive disorder, single episode, in partial remission: Secondary | ICD-10-CM | POA: Diagnosis not present

## 2022-02-06 DIAGNOSIS — F411 Generalized anxiety disorder: Secondary | ICD-10-CM | POA: Diagnosis not present

## 2022-02-06 DIAGNOSIS — M7989 Other specified soft tissue disorders: Secondary | ICD-10-CM | POA: Diagnosis not present

## 2022-02-06 MED ORDER — ALPRAZOLAM 0.25 MG PO TABS: 0.2500 mg | ORAL_TABLET | Freq: Two times a day (BID) | ORAL | 2 refills | Status: DC | PRN
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
## 2022-02-13 ENCOUNTER — Other Ambulatory Visit: Payer: Self-pay | Admitting: Internal Medicine

## 2022-02-13 DIAGNOSIS — F324 Major depressive disorder, single episode, in partial remission: Secondary | ICD-10-CM

## 2022-02-13 DIAGNOSIS — F411 Generalized anxiety disorder: Secondary | ICD-10-CM

## 2022-02-14 NOTE — Telephone Encounter (Signed)
  Notes to clinic:  this rx was sent to [facility] and pt states she wants it moved to [place], not delegated.      Requested Prescriptions  Pending Prescriptions Disp Refills   ALPRAZolam (XANAX) 0.25 MG tablet [Pharmacy Med Name: ALPRAZOLAM 0.25 MG TABLET] 60 tablet 2    Sig: TAKE 1 TABLET BY MOUTH TWICE A DAY AS NEEDED     Not Delegated - Psychiatry: Anxiolytics/Hypnotics 2 Failed - 02/13/2022  7:32 PM      Failed - This refill cannot be delegated      Failed - Urine Drug Screen completed in last 360 days      Passed - Patient is not pregnant      Passed - Valid encounter within last 6 months    Recent Outpatient Visits           1 week ago Essential hypertension   [place] [facility], Jesse Sans, MD   6 months ago Annual physical exam   [place] [facility], Jesse Sans, MD   10 months ago Essential hypertension   [place] [facility] at [facility], Jesse Sans, MD   1 year ago Essential hypertension   [place] [facility] at [facility], Jesse Sans, MD   1 year ago Depression, major, single episode, in partial remission [facility])   [place] [facility] at [facility], Jesse Sans, MD       Future Appointments             In 5 months Army Melia, Jesse Sans, MD [place] [facility] at [facility], [facility]

## 2022-02-14 NOTE — Telephone Encounter (Signed)
Notified pt that Xanax is filled at [facility] and [facility] [place]

## 2022-02-14 NOTE — Telephone Encounter (Signed)
Pharm called to confirm if pt had picked up rx for xanax yet at [facility] in [place] and she has not. [facility] [place] [facility] Will check with pt.

## 2022-02-15 NOTE — Telephone Encounter (Signed)
Please review. [facility] [place]

## 2022-02-15 NOTE — Telephone Encounter (Signed)
Spoke to pt she stated that [facility] transferred the medication to [facility] in [place] that her husband will go and pick it up that she does not need it sent in.  KP

## 2022-03-20 ENCOUNTER — Other Ambulatory Visit: Payer: Self-pay | Admitting: Internal Medicine

## 2022-03-20 DIAGNOSIS — I10 Essential (primary) hypertension: Secondary | ICD-10-CM

## 2022-03-20 DIAGNOSIS — F324 Major depressive disorder, single episode, in partial remission: Secondary | ICD-10-CM

## 2022-03-20 DIAGNOSIS — E785 Hyperlipidemia, unspecified: Secondary | ICD-10-CM

## 2022-03-31 ENCOUNTER — Other Ambulatory Visit: Payer: Self-pay | Admitting: Internal Medicine

## 2022-03-31 DIAGNOSIS — F324 Major depressive disorder, single episode, in partial remission: Secondary | ICD-10-CM

## 2022-04-02 ENCOUNTER — Other Ambulatory Visit: Payer: Self-pay | Admitting: Internal Medicine

## 2022-04-02 DIAGNOSIS — F324 Major depressive disorder, single episode, in partial remission: Secondary | ICD-10-CM

## 2022-04-02 NOTE — Telephone Encounter (Signed)
Requested medications are due for refill today.  yes  Requested medications are on the active medications list.  yes  Last refill. 07/12/2021 #90 1 rf  Future visit scheduled.   yes  Notes to clinic.  Pharmacy needs Dx code    Requested Prescriptions  Pending Prescriptions Disp Refills   mirtazapine (REMERON) 30 MG tablet [Pharmacy Med Name: MIRTAZAPINE 30 MG TABLET] 90 tablet 1    Sig: TAKE 1 TABLET BY MOUTH EVERYDAY AT BEDTIME     Psychiatry: Antidepressants - mirtazapine Passed - 03/31/2022 12:09 PM      Passed - Completed PHQ-2 or PHQ-9 in the last 360 days      Passed - Valid encounter within last 6 months    Recent Outpatient Visits           1 month ago Essential hypertension   [place] [facility] [place] [facility], Jesse Sans, MD   8 months ago Annual physical exam   [place] [facility] at [facility], Jesse Sans, MD   1 year ago Essential hypertension   [place] [facility] at [facility], Jesse Sans, MD   1 year ago Essential hypertension   [place] [facility] at [facility], Jesse Sans, MD   1 year ago Depression, major, single episode, in partial remission [facility])   [place] [facility] at [facility], Jesse Sans, MD       Future Appointments             In 4 months Army Melia, Jesse Sans, MD [place] and [facility] Medicine at [facility], [facility]

## 2022-04-03 NOTE — Telephone Encounter (Signed)
Requested Prescriptions  Pending Prescriptions Disp Refills  . traZODone (DESYREL) 50 MG tablet [Pharmacy Med Name: TRAZODONE 50 MG TABLET] 90 tablet 1    Sig: TAKE 1 TABLET BY MOUTH EVERYDAY AT BEDTIME     Psychiatry: Antidepressants - Serotonin Modulator Passed - 04/02/2022  2:12 AM      Passed - Completed PHQ-2 or PHQ-9 in the last 360 days      Passed - Valid encounter within last 6 months    Recent Outpatient Visits          1 month ago Essential hypertension   [place] [facility] at [facility], Jesse Sans, MD   8 months ago Annual physical exam   [place] [facility] at [facility], Jesse Sans, MD   1 year ago Essential hypertension   [place] [facility] at [facility], Jesse Sans, MD   1 year ago Essential hypertension   [place] [facility] at [facility], Jesse Sans, MD   1 year ago Depression, major, single episode, in partial remission [facility])   [place] [facility] at [facility], Jesse Sans, MD      Future Appointments            In 4 months Army Melia, Jesse Sans, MD [place] and Sports Medicine at [facility], [facility]

## 2022-04-12 ENCOUNTER — Other Ambulatory Visit: Payer: Self-pay | Admitting: Internal Medicine

## 2022-04-12 DIAGNOSIS — I69359 Hemiplegia and hemiparesis following cerebral infarction affecting unspecified side: Secondary | ICD-10-CM

## 2022-04-12 NOTE — Telephone Encounter (Signed)
Requested Prescriptions  Pending Prescriptions Disp Refills   clopidogrel (PLAVIX) 75 MG tablet [Pharmacy Med Name: CLOPIDOGREL 75 MG TABLET] 90 tablet 0    Sig: TAKE 1 TABLET BY MOUTH EVERY DAY     Hematology: Antiplatelets - clopidogrel Failed - 04/12/2022  2:35 AM      Failed - HCT in normal range and within 180 days    HCT  Date Value Ref Range Status  08/03/2021 37.6 36.0 - 46.0 % Final   Hematocrit  Date Value Ref Range Status  07/22/2018 40.0 34.0 - 46.6 % Final         Failed - HGB in normal range and within 180 days    Hemoglobin  Date Value Ref Range Status  08/03/2021 12.9 12.0 - 15.0 g/dL Final  07/22/2018 13.5 11.1 - 15.9 g/dL Final         Failed - PLT in normal range and within 180 days    Platelets  Date Value Ref Range Status  08/03/2021 257 150 - 400 K/uL Final  07/22/2018 222 150 - 450 x10E3/uL Final         Passed - Cr in normal range and within 360 days    Creatinine  Date Value Ref Range Status  02/23/2013 0.45 (L) 0.60 - 1.30 mg/dL Final   Creatinine, Ser  Date Value Ref Range Status  08/03/2021 0.50 0.44 - 1.00 mg/dL Final         Passed - Valid encounter within last 6 months    Recent Outpatient Visits           2 months ago Essential hypertension   [place] [facility] at [facility], Jesse Sans, MD   8 months ago Annual physical exam   [place] [facility] at [facility], Jesse Sans, MD   1 year ago Essential hypertension   [place] [facility] at [facility], Jesse Sans, MD   1 year ago Essential hypertension   [place] [facility] at [facility], Jesse Sans, MD   1 year ago Depression, major, single episode, in partial remission [facility])   [place] [facility] at [facility], Jesse Sans, MD       Future Appointments             In 3 months Army Melia, Jesse Sans,  MD [facility] Medicine at [facility], [facility]

## 2022-04-25 ENCOUNTER — Other Ambulatory Visit: Payer: Self-pay | Admitting: Internal Medicine

## 2022-04-25 DIAGNOSIS — K2100 Gastro-esophageal reflux disease with esophagitis, without bleeding: Secondary | ICD-10-CM

## 2022-04-25 NOTE — Telephone Encounter (Signed)
Requested Prescriptions  Pending Prescriptions Disp Refills   pantoprazole (PROTONIX) 40 MG tablet [Pharmacy Med Name: PANTOPRAZOLE SOD DR 40 MG TAB] 180 tablet 1    Sig: TAKE 1 TABLET BY MOUTH TWICE A DAY     Gastroenterology: Proton Pump Inhibitors Passed - 04/25/2022  1:59 AM      Passed - Valid encounter within last 12 months    Recent Outpatient Visits           2 months ago Essential hypertension   [place] [facility] at [facility], Jesse Sans, MD   8 months ago Annual physical exam   [place] [facility] at [facility], Jesse Sans, MD   1 year ago Essential hypertension   [place] [facility] at [facility], Jesse Sans, MD   1 year ago Essential hypertension   [place] [facility] at [facility], Jesse Sans, MD   1 year ago Depression, major, single episode, in partial remission [facility])   [place] [facility] at [facility], Jesse Sans, MD       Future Appointments             In 3 months Army Melia, Jesse Sans, MD [place] [facility] Medicine at [facility], [facility]

## 2022-05-07 ENCOUNTER — Ambulatory Visit: Payer: PPO

## 2022-05-08 ENCOUNTER — Other Ambulatory Visit: Payer: Self-pay | Admitting: Internal Medicine

## 2022-05-08 DIAGNOSIS — F324 Major depressive disorder, single episode, in partial remission: Secondary | ICD-10-CM

## 2022-05-08 DIAGNOSIS — F411 Generalized anxiety disorder: Secondary | ICD-10-CM

## 2022-05-09 ENCOUNTER — Ambulatory Visit (INDEPENDENT_AMBULATORY_CARE_PROVIDER_SITE_OTHER): Payer: PPO

## 2022-05-09 VITALS — Ht 61.0 in | Wt 136.6 lb

## 2022-05-09 DIAGNOSIS — Z0000 Encounter for general adult medical examination without abnormal findings: Secondary | ICD-10-CM | POA: Diagnosis not present

## 2022-05-09 NOTE — Progress Notes (Signed)
Subjective:   Valerie Hanna is a 81 y.o. female who presents for Medicare Annual (Subsequent) preventive examination.  I connected with  [name] on 05/09/22 by a audio enabled telemedicine application and verified that I am speaking with the correct person using two identifiers.  Patient Location: Home  Provider Location: Office/Clinic  I discussed the limitations of evaluation and management by telemedicine. The patient expressed understanding and agreed to proceed.   Review of Systems    Defer to PCP Cardiac Risk Factors include: advanced age (>79mn, >>83women)     Objective:    Today's Vitals   05/09/22 1049  Weight: 136 lb 9.6 oz (62 kg)  Height: '5\' 1"'$  (1.549 m)   Body mass index is 25.81 kg/m.     05/09/2022    1:10 PM 06/29/2021    7:21 AM 05/03/2021    1:32 PM 05/02/2020   11:45 AM 04/13/2019   10:53 AM 01/20/2018    1:56 PM 01/14/2017    1:51 PM  Advanced Directives  Does Patient Have a Medical Advance Directive? Yes No Yes Yes Yes Yes Yes  Type of Advance Directive Living will  [place]ot[place]Living will HEast HemetLiving will HNanty-GloLiving will HRed LevelLiving will HGlen RockLiving will  Copy of H[place]in Chart?   No - copy requested No - copy requested No - copy requested No - copy requested No - copy requested  Would patient like information on creating a medical advance directive?  No - Patient declined         Current Medications (verified) Outpatient Encounter Medications as of 05/09/2022  Medication Sig   Acetaminophen (ARTHRITIS PAIN PO) Take 2 tablets by mouth daily as needed (Arthritis).   acetaminophen (TYLENOL) 500 MG tablet Take 1,000 mg by mouth every 6 (six) hours as needed for moderate pain or mild pain.   albuterol (VENTOLIN HFA) 108 (90 Base) MCG/ACT inhaler TAKE 2 PUFFS BY MOUTH EVERY 6 HOURS AS NEEDED FOR WHEEZE OR  SHORTNESS OF BREATH   ALPRAZolam (XANAX) 0.25 MG tablet Take 1 tablet (0.25 mg total) by mouth 2 (two) times daily as needed.   CALCIUM PO Take 1 tablet by mouth daily.   Cholecalciferol (VITAMIN D3 PO) Take 1 tablet by mouth daily.   clopidogrel (PLAVIX) 75 MG tablet TAKE 1 TABLET BY MOUTH EVERY DAY   fexofenadine (ALLEGRA) 60 MG tablet Take 60 mg by mouth daily.   lisinopril (ZESTRIL) 10 MG tablet TAKE 1 TABLET BY MOUTH EVERY DAY   mirtazapine (REMERON) 30 MG tablet TAKE 1 TABLET BY MOUTH EVERYDAY AT BEDTIME   Multiple Vitamin (MULTIVITAMIN WITH MINERALS) TABS tablet Take 1 tablet by mouth daily.   pantoprazole (PROTONIX) 40 MG tablet TAKE 1 TABLET BY MOUTH TWICE A DAY   sertraline (ZOLOFT) 100 MG tablet TAKE 1 TABLET BY MOUTH EVERYDAY AT BEDTIME   simvastatin (ZOCOR) 40 MG tablet TAKE 1 TABLET BY MOUTH DAILY AT 6 PM.   traZODone (DESYREL) 50 MG tablet TAKE 1 TABLET BY MOUTH EVERYDAY AT BEDTIME   triamcinolone cream (KENALOG) 0.1 %    No facility-administered encounter medications on file as of 05/09/2022.    Allergies (verified) Cefuroxime axetil and Aspirin   History: Past Medical History:  Diagnosis Date   Allergy    Anemia    Anxiety    Arthritis    [place]    mel[place]  ON HER BACK  MANY YRS AGO   Constipation    Dehydration with hyponatremia 05/26/2015   Depression    GERD (gastroesophageal reflux disease)    H/O hiatal hernia    Headache(784.0)    High cholesterol    History of kidney infection    Hypertension    no longer taking meds since stroke   Osteoporosis    Pleurisy    being treated for this now 01/25/14 (left side)   Stroke ([place])    2014, WITH LEFT SIDED WEAKNESS   Past Surgical History:  Procedure Laterality Date   ABDOMINAL HYSTERECTOMY     APPENDECTOMY     BACK SURGERY     kyphoplasty   bladder tack     CATARACT EXTRACTION W/PHACO Left 06/29/2021   Procedure: CATARACT EXTRACTION PHACO AND INTRAOCULAR LENS PLACEMENT ([place]) LEFT 11.45 01:25.4;   Surgeon: Leandrew Koyanagi, MD;  Location: [facility] ORS;  Service: Ophthalmology;  Laterality: Left;   CHOLECYSTECTOMY     COLONOSCOPY     DILATION AND CURETTAGE OF UTERUS     EYE SURGERY Right    cataract removed   EYE SURGERY Right    "bubble" put in for a hole in retina   FOOT SURGERY Bilateral    KYPHOPLASTY N/A 01/28/2014   Procedure: LUMBAR FOUR KYPHOPLASTY;  Surgeon: Ashok Pall, MD;  Location: [place] [facility];  Service: Neurosurgery;  Laterality: N/A;  L4 Kyphoplasty   KYPHOPLASTY N/A 04/18/2016   Procedure: KYPHOPLASTY Thoracic eight-Thoracic ten;  Surgeon: Ashok Pall, MD;  Location: [place];  Service: Neurosurgery;  Laterality: N/A;  KYPHOPLASTY T8-T10   TOTAL HIP ARTHROPLASTY Right 05/26/2015   Procedure: RIGHT BIPOLAR VS TOTAL HIP ANTERIOR APPROACH;  Surgeon: Mcarthur Rossetti, MD;  Location: [place];  Service: Orthopedics;  Laterality: Right;   Family History  Problem Relation Age of Onset   Healthy Sister    Healthy Sister    Social History   Socioeconomic History   Marital status: Married    Spouse name: Not on file   Number of children: 1   Years of education: Not on file   Highest education level: 11th grade  Occupational History   Occupation: Retired  Tobacco Use   Smoking status: Former    Packs/day: 0.50    Years: 30.00    Total pack years: 15.00    Types: Cigarettes    Quit date: 04/15/2016    Years since quitting: 6.0   Smokeless tobacco: Never   Tobacco comments:    smoking cessation materials not required  Vaping Use   Vaping Use: Never used  Substance and Sexual Activity   Alcohol use: Yes    Alcohol/week: 14.0 - 21.0 standard drinks of alcohol    Types: 14 - 21 Glasses of wine per week   Drug use: No   Sexual activity: Not Currently  Other Topics Concern   Not on file  Social History Narrative   Pt lives with husband, only child lives in a group home.    Social Determinants of Health   Financial Resource Strain: Low Risk  (05/09/2022)    Overall Financial Resource Strain (CARDIA)    Difficulty of Paying Living Expenses: Not hard at all  Food Insecurity: No Food Insecurity (05/09/2022)   Hunger Vital Sign    Worried About Running Out of Food in the Last Year: Never true    Ran Out of Food in the Last Year: Never true  Transportation Needs: No Transportation Needs (05/03/2021)   PRAPARE - Transportation  Lack of Transportation (Medical): No    Lack of Transportation (Non-Medical): No  Physical Activity: Inactive (05/09/2022)   Exercise Vital Sign    Days of Exercise per Week: 0 days    Minutes of Exercise per Session: 0 min  Stress: Stress Concern Present (05/09/2022)   [place]    Feeling of Stress : To some extent  Social Connections: Moderately Isolated (05/09/2022)   Social Connection and Isolation Panel [NHANES]    Frequency of Communication with Friends and Family: More than three times a week    Frequency of Social Gatherings with Friends and Family: Once a week    Attends Religious Services: Never    Marine scientist or Organizations: No    Attends Music therapist: Never    Marital Status: Married    Tobacco Counseling Counseling given: Not Answered Tobacco comments: smoking cessation materials not required   Clinical Intake:  Pre-visit preparation completed: Yes  Pain : No/denies pain     BMI - recorded: 25.81 Nutritional Status: BMI > 30  Obese Diabetes: No     Diabetic? No.     Information entered by :: Wyatt Haste, [place] of Daily Living    05/09/2022    1:11 PM 08/03/2021    8:52 AM  In your present state of health, do you have any difficulty performing the following activities:  Hearing? 0 0  Vision? 0 0  Difficulty concentrating or making decisions? 0 0  Walking or climbing stairs? 1 0  Dressing or bathing? 0 0  Doing errands, shopping? 0 0  Preparing Food and eating ? N    Using the Toilet? N   In the past six months, have you accidently leaked urine? N   Do you have problems with loss of bowel control? N   Managing your Medications? N   Managing your Finances? N   Housekeeping or managing your Housekeeping? N     Patient Care Team: Glean Hess, MD as PCP - General (Internal Medicine) Garrel Ridgel, DPM as Consulting Physician (Podiatry)  Indicate any recent Medical Services you may have received from other than [facility] providers in the past year (date may be approximate).     Assessment:   This is a routine wellness examination for [place].  Hearing/Vision screen No results found.  Dietary issues and exercise activities discussed: Current Exercise Habits: The patient does not participate in regular exercise at present   Goals Addressed   None   Depression Screen    05/09/2022    1:09 PM 02/06/2022   10:24 AM 08/03/2021    8:52 AM 05/03/2021    1:30 PM 03/29/2021    9:26 AM 01/27/2021    9:52 AM 11/16/2020   10:25 AM  PHQ 2/9 Scores  PHQ - 2 Score 4 0 0 0 2 0 0  PHQ- 9 Score 8 0 0 '2 6 2 2    '$ Fall Risk    05/09/2022    1:11 PM 02/06/2022   10:25 AM 08/03/2021    8:53 AM 05/03/2021    1:34 PM 03/29/2021    9:26 AM  Fall Risk   Falls in the past year? 0 0 0 0 0  Number falls in past yr: 0 0 0 0 0  Injury with Fall? 0 0 0 0 0  Risk for fall due to : No Fall Risks No Fall Risks No Fall Risks History  of fall(s);Impaired balance/gait History of fall(s);Impaired balance/gait  Follow up Falls evaluation completed Falls evaluation completed Falls evaluation completed Falls prevention discussed Falls evaluation completed    FALL RISK PREVENTION PERTAINING TO THE HOME:  Any stairs in or around the home? No  Home free of loose throw rugs in walkways, pet beds, electrical cords, etc? Yes  Adequate lighting in your home to reduce risk of falls? Yes   ASSISTIVE DEVICES UTILIZED TO PREVENT FALLS:  Life alert? No  Use of a cane, walker or w/c? Yes   Grab bars in the bathroom? Yes  Shower chair or bench in shower? Yes  Elevated toilet seat or a handicapped toilet? Yes   Cognitive Function:        05/09/2022    1:11 PM 05/02/2020   11:49 AM 04/13/2019   10:57 AM 01/20/2018    1:57 PM 01/14/2017    1:51 PM  6CIT Screen  What Year? 4 points 0 points 0 points 0 points 0 points  What month? 0 points 0 points 0 points 0 points 0 points  What time? 0 points 0 points 0 points 0 points 0 points  Count back from 20 2 points 0 points 0 points 0 points 0 points  Months in reverse 2 points 2 points 4 points 0 points 0 points  Repeat phrase 2 points 4 points 2 points 4 points 4 points  Total Score 10 points 6 points 6 points 4 points 4 points    Immunizations  There is no immunization history on file for this patient.  TDAP status: Due, Education has been provided regarding the importance of this vaccine. Advised may receive this vaccine at local pharmacy or Health Dept. Aware to provide a copy of the vaccination record if obtained from local pharmacy or Health Dept. Verbalized acceptance and understanding.  Flu Vaccine status: Declined, Education has been provided regarding the importance of this vaccine but patient still declined. Advised may receive this vaccine at local pharmacy or Health Dept. Aware to provide a copy of the vaccination record if obtained from local pharmacy or Health Dept. Verbalized acceptance and understanding.  Pneumococcal vaccine status: Declined,  Education has been provided regarding the importance of this vaccine but patient still declined. Advised may receive this vaccine at local pharmacy or Health Dept. Aware to provide a copy of the vaccination record if obtained from local pharmacy or Health Dept. Verbalized acceptance and understanding.   Covid-19 vaccine status: Declined, Education has been provided regarding the importance of this vaccine but patient still declined. Advised may receive this vaccine at local  pharmacy or Health Dept.or vaccine clinic. Aware to provide a copy of the vaccination record if obtained from local pharmacy or Health Dept. Verbalized acceptance and understanding.  Qualifies for Shingles Vaccine? Yes   Zostavax completed No   Shingrix Completed?: No.    Education has been provided regarding the importance of this vaccine. Patient has been advised to call insurance company to determine out of pocket expense if they have not yet received this vaccine. Advised may also receive vaccine at local pharmacy or Health Dept. Verbalized acceptance and understanding.  Screening Tests Health Maintenance  Topic Date Due   DTaP/Tdap/Td (1 - Tdap) Never done   Zoster Vaccines- Shingrix (1 of 2) Never done   Pneumonia Vaccine 40+ Years old (1 - PCV) Never done   INFLUENZA VACCINE  06/05/2023 (Originally 01/02/2022)   Medicare Annual Wellness (AWV)  05/10/2023   Hepatitis C Screening  Completed  HPV VACCINES  Aged Out   COLONOSCOPY (Pts 45-64yr Insurance coverage will need to be confirmed)  Discontinued   COVID-19 Vaccine  Discontinued    Health Maintenance  Health Maintenance Due  Topic Date Due   DTaP/Tdap/Td (1 - Tdap) Never done   Zoster Vaccines- Shingrix (1 of 2) Never done   Pneumonia Vaccine 79 Years old (1 - PCV) Never done    Colorectal cancer screening: No longer required.   Mammogram status: No longer required due to age.   Lung Cancer Screening: (Low Dose CT Chest recommended if Age 79-80years, 30 pack-year currently smoking OR have quit w/in 15years.) does qualify.   Lung Cancer Screening Referral: Patient Declines Screening.  Additional Screening:  Hepatitis C Screening: does qualify; Completed 08/01/2020  Vision Screening: Recommended annual ophthalmology exams for early detection of glaucoma and other disorders of the eye. Is the patient up to date with their annual eye exam?  Yes  Who is the provider or what is the name of the office in which the  patient attends annual eye exams? Dr Dingledine   Dental Screening: Recommended annual dental exams for proper oral hygiene  Community Resource Referral / Chronic Care Management: CRR required this visit?  No   CCM required this visit?  No      Plan:     I have personally reviewed and noted the following in the patient's chart:   Medical and social history Use of alcohol, tobacco or illicit drugs  Current medications and supplements including opioid prescriptions. Patient is not currently taking opioid prescriptions. Functional ability and status Nutritional status Physical activity Advanced directives List of other physicians Hospitalizations, surgeries, and ER visits in previous 12 months Vitals Screenings to include cognitive, depression, and falls Referrals and appointments  In addition, I have reviewed and discussed with patient certain preventive protocols, quality metrics, and best practice recommendations. A written personalized care plan for preventive services as well as general preventive health recommendations were provided to patient.     CClista Bernhardt [place]  05/09/2022    Ms. CKittler, Thank you for taking time to come for your Medicare Wellness Visit. I appreciate your ongoing commitment to your health goals. Please review the following plan we discussed and let me know if I can assist you in the future.   These are the goals we discussed:  Goals      Increase water intake     Recommend drinking at least 6-8 glasses of water a day     Prevent falls     Recommend to remove any items from the home that may cause slips or trips.        This is a list of the screening recommended for you and due dates:  Health Maintenance  Topic Date Due   DTaP/Tdap/Td vaccine (1 - Tdap) Never done   Zoster (Shingles) Vaccine (1 of 2) Never done   Pneumonia Vaccine (1 - PCV) Never done   Flu Shot  06/05/2023*   Medicare Annual Wellness Visit  05/10/2023    Hepatitis C Screening: USPSTF Recommendation to screen - Ages 18-79 yo.  Completed   HPV Vaccine  Aged Out   Colon Cancer Screening  Discontinued   COVID-19 Vaccine  Discontinued  *Topic was postponed. The date shown is not the original due date.     Nurse Notes: None.

## 2022-05-09 NOTE — Telephone Encounter (Signed)
Requested medication (s) are due for refill today: Yes  Requested medication (s) are on the active medication list: Yes  Last refill:  02/06/22  Future visit scheduled: Yes  Notes to clinic:  See request.    Requested Prescriptions  Pending Prescriptions Disp Refills   ALPRAZolam (XANAX) 0.25 MG tablet [Pharmacy Med Name: ALPRAZOLAM 0.25 MG TABLET] 60 tablet     Sig: TAKE 1 TABLET BY MOUTH TWICE A DAY AS NEEDED     Not Delegated - Psychiatry: Anxiolytics/Hypnotics 2 Failed - 05/08/2022  6:52 PM      Failed - This refill cannot be delegated      Failed - Urine Drug Screen completed in last 360 days      Passed - Patient is not pregnant      Passed - Valid encounter within last 6 months    Recent Outpatient Visits           3 months ago Essential hypertension   [place] [facility] at [facility], Jesse Sans, MD   9 months ago Annual physical exam   [place] [facility] at [facility], Jesse Sans, MD   1 year ago Essential hypertension   [place] [facility] at [facility], Jesse Sans, MD   1 year ago Essential hypertension   [place] [facility] at [facility], Jesse Sans, MD   1 year ago Depression, major, single episode, in partial remission [facility])   [place] [facility] at [facility], Jesse Sans, MD       Future Appointments             In 3 months Army Melia, Jesse Sans, MD [place] and Sports Medicine at [facility], [facility]

## 2022-05-11 ENCOUNTER — Other Ambulatory Visit: Payer: Self-pay | Admitting: Internal Medicine

## 2022-05-14 NOTE — Telephone Encounter (Signed)
Requested Prescriptions  Pending Prescriptions Disp Refills   albuterol (VENTOLIN HFA) 108 (90 Base) MCG/ACT inhaler [Pharmacy Med Name: ALBUTEROL HFA (PROAIR) INHALER] 8.5 each 0    Sig: TAKE 2 PUFFS BY MOUTH EVERY 6 HOURS AS NEEDED FOR WHEEZE OR SHORTNESS OF BREATH     Pulmonology:  Beta Agonists 2 Passed - 05/11/2022  7:43 PM      Passed - Last BP in normal range    BP Readings from Last 1 Encounters:  02/06/22 124/80         Passed - Last Heart Rate in normal range    Pulse Readings from Last 1 Encounters:  02/06/22 89         Passed - Valid encounter within last 12 months    Recent Outpatient Visits           3 months ago Essential hypertension   [place] [facility] at [facility], Jesse Sans, MD   9 months ago Annual physical exam   [place] [facility] Medicine at [facility], Jesse Sans, MD   1 year ago Essential hypertension   [place] [facility] Medicine at [facility], Jesse Sans, MD   1 year ago Essential hypertension   [place] [facility] and [facility] Medicine at [facility], Jesse Sans, MD   1 year ago Depression, major, single episode, in partial remission [facility])   [place] [facility] and [facility] Medicine at [facility], Jesse Sans, MD       Future Appointments             In 2 months Army Melia, Jesse Sans, MD [place] and Sports Medicine at [facility], [facility]

## 2022-05-17 DIAGNOSIS — I2510 Atherosclerotic heart disease of native coronary artery without angina pectoris: Secondary | ICD-10-CM | POA: Diagnosis not present

## 2022-05-17 DIAGNOSIS — Z8659 Personal history of other mental and behavioral disorders: Secondary | ICD-10-CM | POA: Diagnosis not present

## 2022-05-17 DIAGNOSIS — Z6822 Body mass index (BMI) 22.0-22.9, adult: Secondary | ICD-10-CM | POA: Diagnosis not present

## 2022-05-17 DIAGNOSIS — Z87891 Personal history of nicotine dependence: Secondary | ICD-10-CM | POA: Diagnosis not present

## 2022-05-17 DIAGNOSIS — E785 Hyperlipidemia, unspecified: Secondary | ICD-10-CM | POA: Diagnosis not present

## 2022-05-17 DIAGNOSIS — Z7982 Long term (current) use of aspirin: Secondary | ICD-10-CM | POA: Diagnosis not present

## 2022-05-17 DIAGNOSIS — Z7902 Long term (current) use of antithrombotics/antiplatelets: Secondary | ICD-10-CM | POA: Diagnosis not present

## 2022-05-31 DIAGNOSIS — H35372 Puckering of macula, left eye: Secondary | ICD-10-CM | POA: Diagnosis not present

## 2022-06-10 ENCOUNTER — Other Ambulatory Visit: Payer: Self-pay | Admitting: Internal Medicine

## 2022-06-11 NOTE — Telephone Encounter (Signed)
Requested Prescriptions  Pending Prescriptions Disp Refills   albuterol (VENTOLIN HFA) 108 (90 Base) MCG/ACT inhaler [Pharmacy Med Name: ALBUTEROL HFA (PROAIR) INHALER] 8.5 each 0    Sig: TAKE 2 PUFFS BY MOUTH EVERY 6 HOURS AS NEEDED FOR WHEEZE OR SHORTNESS OF BREATH     Pulmonology:  Beta Agonists 2 Passed - 06/10/2022  1:00 AM      Passed - Last BP in normal range    BP Readings from Last 1 Encounters:  02/06/22 124/80         Passed - Last Heart Rate in normal range    Pulse Readings from Last 1 Encounters:  02/06/22 89         Passed - Valid encounter within last 12 months    Recent Outpatient Visits           4 months ago Essential hypertension   [place] [facility] at [facility], Jesse Sans, MD   10 months ago Annual physical exam   [place] [facility] at [facility], Jesse Sans, MD   1 year ago Essential hypertension   [place] [facility] at [facility], Jesse Sans, MD   1 year ago Essential hypertension   [place] [facility] at [facility] Jesse Sans, MD   1 year ago Depression, major, single episode, in partial remission [facility])   [place] [facility] at [facility], Jesse Sans, MD       Future Appointments             In 1 month Army Melia, Jesse Sans, MD [place] and [facility] at [facility], [facility]

## 2022-06-17 ENCOUNTER — Other Ambulatory Visit: Payer: Self-pay | Admitting: Internal Medicine

## 2022-06-17 DIAGNOSIS — F324 Major depressive disorder, single episode, in partial remission: Secondary | ICD-10-CM

## 2022-06-17 DIAGNOSIS — I10 Essential (primary) hypertension: Secondary | ICD-10-CM

## 2022-06-27 ENCOUNTER — Other Ambulatory Visit: Payer: Self-pay | Admitting: Internal Medicine

## 2022-06-27 DIAGNOSIS — F324 Major depressive disorder, single episode, in partial remission: Secondary | ICD-10-CM

## 2022-07-06 ENCOUNTER — Other Ambulatory Visit: Payer: Self-pay | Admitting: Internal Medicine

## 2022-07-06 NOTE — Telephone Encounter (Signed)
Requested Prescriptions  Pending Prescriptions Disp Refills   albuterol (VENTOLIN HFA) 108 (90 Base) MCG/ACT inhaler [Pharmacy Med Name: ALBUTEROL HFA (VENTOLIN) INH] 8.5 each 2    Sig: INHALE 2 PUFFS BY MOUTH EVERY 6 HOURS AS NEEDED FOR WHEEZING OR SHORTNESS OF BREATH     Pulmonology:  Beta Agonists 2 Passed - 07/06/2022  1:24 AM      Passed - Last BP in normal range    BP Readings from Last 1 Encounters:  02/06/22 124/80         Passed - Last Heart Rate in normal range    Pulse Readings from Last 1 Encounters:  02/06/22 89         Passed - Valid encounter within last 12 months    Recent Outpatient Visits           5 months ago Essential hypertension   [place] at [facility], Jesse Sans, MD   11 months ago Annual physical exam   [facility] at [facility], Jesse Sans, MD   1 year ago Essential hypertension   [place] at [place], Jesse Sans, MD   1 year ago Essential hypertension   [place] at [facility], Jesse Sans, MD   1 year ago Depression, major, single episode, in partial remission [facility])   [place] at [facility], Jesse Sans, MD       Future Appointments             In 1 month Army Melia, Jesse Sans, MD [place] at [facility], [facility]

## 2022-07-16 ENCOUNTER — Other Ambulatory Visit: Payer: Self-pay | Admitting: Internal Medicine

## 2022-07-16 DIAGNOSIS — E785 Hyperlipidemia, unspecified: Secondary | ICD-10-CM

## 2022-07-16 DIAGNOSIS — F324 Major depressive disorder, single episode, in partial remission: Secondary | ICD-10-CM

## 2022-08-08 ENCOUNTER — Ambulatory Visit (INDEPENDENT_AMBULATORY_CARE_PROVIDER_SITE_OTHER): Payer: PPO | Admitting: Internal Medicine

## 2022-08-08 ENCOUNTER — Encounter: Payer: Self-pay | Admitting: Internal Medicine

## 2022-08-08 ENCOUNTER — Other Ambulatory Visit
Admission: RE | Admit: 2022-08-08 | Discharge: 2022-08-08 | Disposition: A | Discharge status: Home / Self Care | Payer: PPO | Attending: Internal Medicine | Admitting: Internal Medicine

## 2022-08-08 VITALS — BP 112/76 | HR 91 | Ht 61.0 in | Wt 132.0 lb

## 2022-08-08 DIAGNOSIS — I10 Essential (primary) hypertension: Secondary | ICD-10-CM

## 2022-08-08 DIAGNOSIS — F411 Generalized anxiety disorder: Secondary | ICD-10-CM

## 2022-08-08 DIAGNOSIS — E559 Vitamin D deficiency, unspecified: Secondary | ICD-10-CM | POA: Insufficient documentation

## 2022-08-08 DIAGNOSIS — Z8781 Personal history of (healed) traumatic fracture: Secondary | ICD-10-CM | POA: Diagnosis not present

## 2022-08-08 DIAGNOSIS — D6869 Other thrombophilia: Secondary | ICD-10-CM | POA: Diagnosis not present

## 2022-08-08 DIAGNOSIS — I69359 Hemiplegia and hemiparesis following cerebral infarction affecting unspecified side: Secondary | ICD-10-CM

## 2022-08-08 DIAGNOSIS — F324 Major depressive disorder, single episode, in partial remission: Secondary | ICD-10-CM | POA: Diagnosis not present

## 2022-08-08 DIAGNOSIS — Z7902 Long term (current) use of antithrombotics/antiplatelets: Secondary | ICD-10-CM | POA: Insufficient documentation

## 2022-08-08 DIAGNOSIS — E785 Hyperlipidemia, unspecified: Secondary | ICD-10-CM | POA: Diagnosis not present

## 2022-08-08 DIAGNOSIS — Z0000 Encounter for general adult medical examination without abnormal findings: Secondary | ICD-10-CM | POA: Diagnosis not present

## 2022-08-08 LAB — TSH: TSH: 0.704 u[IU]/mL (ref 0.350–4.500)

## 2022-08-08 LAB — CBC WITH DIFFERENTIAL/PLATELET
Abs Immature Granulocytes: 0.02 10*3/uL (ref 0.00–0.07)
Basophils Absolute: 0.1 10*3/uL (ref 0.0–0.1)
Basophils Relative: 1 %
Eosinophils Absolute: 0.1 10*3/uL (ref 0.0–0.5)
Eosinophils Relative: 2 %
HCT: 38.1 % (ref 36.0–46.0)
Hemoglobin: 12.5 g/dL (ref 12.0–15.0)
Immature Granulocytes: 0 %
Lymphocytes Relative: 28 %
Lymphs Abs: 1.7 10*3/uL (ref 0.7–4.0)
MCH: 34.5 pg — ABNORMAL HIGH (ref 26.0–34.0)
MCHC: 32.8 g/dL (ref 30.0–36.0)
MCV: 105.2 fL — ABNORMAL HIGH (ref 80.0–100.0)
Monocytes Absolute: 0.4 10*3/uL (ref 0.1–1.0)
Monocytes Relative: 8 %
Neutro Abs: 3.6 10*3/uL (ref 1.7–7.7)
Neutrophils Relative %: 61 %
Platelets: 222 10*3/uL (ref 150–400)
RBC: 3.62 MIL/uL — ABNORMAL LOW (ref 3.87–5.11)
RDW: 12.3 % (ref 11.5–15.5)
WBC: 5.9 10*3/uL (ref 4.0–10.5)
nRBC: 0 % (ref 0.0–0.2)

## 2022-08-08 LAB — LIPID PANEL
Cholesterol: 194 mg/dL (ref 0–200)
HDL: 101 mg/dL (ref >40)
LDL Cholesterol: 71 mg/dL (ref 0–99)
Total CHOL/HDL Ratio: 1.9 RATIO
Triglycerides: 112 mg/dL (ref <150)
VLDL: 22 mg/dL (ref 0–40)

## 2022-08-08 LAB — VITAMIN D 25 HYDROXY (VIT D DEFICIENCY, FRACTURES): Vit D, 25-Hydroxy: 69.94 ng/mL (ref 30–100)

## 2022-08-08 LAB — COMPREHENSIVE METABOLIC PANEL
ALT: 13 U/L (ref 0–44)
AST: 19 U/L (ref 15–41)
Albumin: 4.1 g/dL (ref 3.5–5.0)
Alkaline Phosphatase: 52 U/L (ref 38–126)
Anion gap: 7 (ref 5–15)
BUN: 10 mg/dL (ref 8–23)
CO2: 27 mmol/L (ref 22–32)
Calcium: 9.1 mg/dL (ref 8.9–10.3)
Chloride: 97 mmol/L — ABNORMAL LOW (ref 98–111)
Creatinine, Ser: 0.46 mg/dL (ref 0.44–1.00)
GFR, Estimated: >60 mL/min (ref >60)
Glucose, Bld: 98 mg/dL (ref 70–99)
Potassium: 5 mmol/L (ref 3.5–5.1)
Sodium: 131 mmol/L — ABNORMAL LOW (ref 135–145)
Total Bilirubin: 0.6 mg/dL (ref 0.3–1.2)
Total Protein: 7.4 g/dL (ref 6.5–8.1)

## 2022-08-08 MED ORDER — ALPRAZOLAM 0.25 MG PO TABS: 0.2500 mg | ORAL_TABLET | Freq: Two times a day (BID) | ORAL | 3 refills | Status: DC | PRN
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
## 2022-08-08 NOTE — Assessment & Plan Note (Signed)
Clinically stable exam with well controlled BP on lisinopril 10 mg. Tolerating medications without side effects. Pt to continue current regimen and low sodium diet.

## 2022-08-08 NOTE — Assessment & Plan Note (Signed)
Clinically stable on current regimen with good control of symptoms, No SI or HI. On zoloft, Trazodone, remeron and xanax No change in management at this time.

## 2022-08-08 NOTE — Assessment & Plan Note (Signed)
Tolerating statin medications without concerns LDL is  Lab Results  Component Value Date   LDLCALC 36 08/03/2021   with a goal of < 70.

## 2022-08-08 NOTE — Assessment & Plan Note (Signed)
Plavix daily

## 2022-08-08 NOTE — Assessment & Plan Note (Signed)
Stable LLE weakness; ambulatory with walker On Plavix for secondary prevention - no bleeding issues

## 2022-08-08 NOTE — Assessment & Plan Note (Addendum)
On calcium and vitamin D She would consider oral treatment if bone density is severe

## 2022-09-18 ENCOUNTER — Ambulatory Visit
Admission: RE | Admit: 2022-09-18 | Discharge: 2022-09-18 | Disposition: A | Discharge status: Home / Self Care | Payer: PPO | Source: Ambulatory Visit | Attending: Internal Medicine | Admitting: Internal Medicine

## 2022-09-18 DIAGNOSIS — Z8781 Personal history of (healed) traumatic fracture: Secondary | ICD-10-CM | POA: Insufficient documentation

## 2022-09-18 DIAGNOSIS — Z780 Asymptomatic menopausal state: Secondary | ICD-10-CM | POA: Insufficient documentation

## 2022-09-18 DIAGNOSIS — M810 Age-related osteoporosis without current pathological fracture: Secondary | ICD-10-CM | POA: Diagnosis not present

## 2022-09-20 ENCOUNTER — Other Ambulatory Visit: Payer: Self-pay

## 2022-09-20 MED ORDER — ALENDRONATE SODIUM 70 MG PO TABS: 70.0000 mg | ORAL_TABLET | ORAL | 2 refills | Status: DC

## 2022-09-20 NOTE — Progress Notes (Signed)
Called pt left VM to call back.  KP 

## 2022-09-20 NOTE — Progress Notes (Signed)
Pt asked about a medication for osteoporosis she wants it sent to [facility].  KP

## 2022-10-15 ENCOUNTER — Other Ambulatory Visit: Payer: Self-pay | Admitting: Internal Medicine

## 2022-10-15 DIAGNOSIS — I69359 Hemiplegia and hemiparesis following cerebral infarction affecting unspecified side: Secondary | ICD-10-CM

## 2022-11-29 DIAGNOSIS — H35372 Puckering of macula, left eye: Secondary | ICD-10-CM | POA: Diagnosis not present

## 2022-11-29 DIAGNOSIS — Z961 Presence of intraocular lens: Secondary | ICD-10-CM | POA: Diagnosis not present

## 2022-11-29 DIAGNOSIS — H35341 Macular cyst, hole, or pseudohole, right eye: Secondary | ICD-10-CM | POA: Diagnosis not present

## 2022-11-29 DIAGNOSIS — H26492 Other secondary cataract, left eye: Secondary | ICD-10-CM | POA: Diagnosis not present

## 2022-12-01 ENCOUNTER — Other Ambulatory Visit: Payer: Self-pay | Admitting: Internal Medicine

## 2022-12-01 DIAGNOSIS — F411 Generalized anxiety disorder: Secondary | ICD-10-CM

## 2022-12-01 DIAGNOSIS — F324 Major depressive disorder, single episode, in partial remission: Secondary | ICD-10-CM

## 2022-12-04 ENCOUNTER — Other Ambulatory Visit: Payer: Self-pay | Admitting: Internal Medicine

## 2022-12-04 DIAGNOSIS — K2100 Gastro-esophageal reflux disease with esophagitis, without bleeding: Secondary | ICD-10-CM

## 2022-12-18 DIAGNOSIS — S6392XD Sprain of unspecified part of left wrist and hand, subsequent encounter: Secondary | ICD-10-CM | POA: Diagnosis not present

## 2022-12-25 DIAGNOSIS — S6392XD Sprain of unspecified part of left wrist and hand, subsequent encounter: Secondary | ICD-10-CM | POA: Diagnosis not present

## 2022-12-29 ENCOUNTER — Other Ambulatory Visit: Payer: Self-pay | Admitting: Internal Medicine

## 2022-12-29 DIAGNOSIS — E785 Hyperlipidemia, unspecified: Secondary | ICD-10-CM

## 2022-12-29 DIAGNOSIS — F324 Major depressive disorder, single episode, in partial remission: Secondary | ICD-10-CM

## 2023-02-08 ENCOUNTER — Other Ambulatory Visit
Admission: RE | Admit: 2023-02-08 | Discharge: 2023-02-08 | Disposition: A | Discharge status: Home / Self Care | Payer: PPO | Source: Home / Self Care | Attending: Internal Medicine | Admitting: Internal Medicine

## 2023-02-08 ENCOUNTER — Ambulatory Visit
Admission: RE | Admit: 2023-02-08 | Discharge: 2023-02-08 | Disposition: A | Discharge status: Home / Self Care | Payer: PPO | Attending: Internal Medicine | Admitting: Internal Medicine

## 2023-02-08 ENCOUNTER — Ambulatory Visit
Admission: RE | Admit: 2023-02-08 | Discharge: 2023-02-08 | Disposition: A | Discharge status: Home / Self Care | Payer: PPO | Source: Ambulatory Visit | Attending: Internal Medicine | Admitting: Internal Medicine

## 2023-02-08 ENCOUNTER — Ambulatory Visit (INDEPENDENT_AMBULATORY_CARE_PROVIDER_SITE_OTHER): Payer: PPO | Admitting: Internal Medicine

## 2023-02-08 ENCOUNTER — Encounter: Payer: Self-pay | Admitting: Internal Medicine

## 2023-02-08 VITALS — BP 122/72 | HR 99 | Ht 61.0 in | Wt 130.0 lb

## 2023-02-08 DIAGNOSIS — H6011 Cellulitis of right external ear: Secondary | ICD-10-CM | POA: Diagnosis not present

## 2023-02-08 DIAGNOSIS — R0989 Other specified symptoms and signs involving the circulatory and respiratory systems: Secondary | ICD-10-CM | POA: Insufficient documentation

## 2023-02-08 DIAGNOSIS — S2249XA Multiple fractures of ribs, unspecified side, initial encounter for closed fracture: Secondary | ICD-10-CM | POA: Diagnosis not present

## 2023-02-08 DIAGNOSIS — I10 Essential (primary) hypertension: Secondary | ICD-10-CM | POA: Diagnosis not present

## 2023-02-08 DIAGNOSIS — F324 Major depressive disorder, single episode, in partial remission: Secondary | ICD-10-CM | POA: Diagnosis not present

## 2023-02-08 LAB — CBC WITH DIFFERENTIAL/PLATELET
Abs Immature Granulocytes: 0.03 10*3/uL (ref 0.00–0.07)
Basophils Absolute: 0.1 10*3/uL (ref 0.0–0.1)
Basophils Relative: 1 %
Eosinophils Absolute: 0.1 10*3/uL (ref 0.0–0.5)
Eosinophils Relative: 1 %
HCT: 39.9 % (ref 36.0–46.0)
Hemoglobin: 13.2 g/dL (ref 12.0–15.0)
Immature Granulocytes: 0 %
Lymphocytes Relative: 21 %
Lymphs Abs: 1.5 10*3/uL (ref 0.7–4.0)
MCH: 34.4 pg — ABNORMAL HIGH (ref 26.0–34.0)
MCHC: 33.1 g/dL (ref 30.0–36.0)
MCV: 103.9 fL — ABNORMAL HIGH (ref 80.0–100.0)
Monocytes Absolute: 0.5 10*3/uL (ref 0.1–1.0)
Monocytes Relative: 7 %
Neutro Abs: 5.1 10*3/uL (ref 1.7–7.7)
Neutrophils Relative %: 70 %
Platelets: 240 10*3/uL (ref 150–400)
RBC: 3.84 MIL/uL — ABNORMAL LOW (ref 3.87–5.11)
RDW: 11.9 % (ref 11.5–15.5)
WBC: 7.3 10*3/uL (ref 4.0–10.5)
nRBC: 0 % (ref 0.0–0.2)

## 2023-02-08 MED ORDER — DOXYCYCLINE HYCLATE 100 MG PO TABS: 100.0000 mg | ORAL_TABLET | Freq: Two times a day (BID) | ORAL | 0 refills | Status: AC

## 2023-02-08 MED ORDER — ALBUTEROL SULFATE HFA 108 (90 BASE) MCG/ACT IN AERS: 2.0000 | INHALATION_SPRAY | Freq: Four times a day (QID) | RESPIRATORY_TRACT | 2 refills | Status: DC | PRN
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
## 2023-02-08 NOTE — Assessment & Plan Note (Signed)
Normal exam with stable BP on lisinopril. No concerns or side effects to current medication. No change in regimen; continue low sodium diet.

## 2023-02-08 NOTE — Progress Notes (Signed)
PC to pt, discussed labs pt voiced understanding.

## 2023-02-08 NOTE — Assessment & Plan Note (Signed)
Clinically stable on current regimen with good control of symptoms, No SI or HI. No change in management at this time. Continue Sertraline and Mirtazapine and Xanax

## 2023-02-15 ENCOUNTER — Encounter: Payer: Self-pay | Admitting: Internal Medicine

## 2023-02-15 ENCOUNTER — Ambulatory Visit (INDEPENDENT_AMBULATORY_CARE_PROVIDER_SITE_OTHER): Payer: PPO | Admitting: Internal Medicine

## 2023-02-15 VITALS — BP 125/76 | HR 96 | Ht 61.0 in | Wt 131.0 lb

## 2023-02-15 DIAGNOSIS — J069 Acute upper respiratory infection, unspecified: Secondary | ICD-10-CM

## 2023-02-15 DIAGNOSIS — H6011 Cellulitis of right external ear: Secondary | ICD-10-CM

## 2023-02-15 DIAGNOSIS — S2241XD Multiple fractures of ribs, right side, subsequent encounter for fracture with routine healing: Secondary | ICD-10-CM | POA: Diagnosis not present
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
## 2023-03-18 DIAGNOSIS — I69351 Hemiplegia and hemiparesis following cerebral infarction affecting right dominant side: Secondary | ICD-10-CM | POA: Diagnosis not present

## 2023-03-18 DIAGNOSIS — G3184 Mild cognitive impairment, so stated: Secondary | ICD-10-CM | POA: Diagnosis not present

## 2023-03-18 DIAGNOSIS — Z515 Encounter for palliative care: Secondary | ICD-10-CM | POA: Diagnosis not present

## 2023-03-18 DIAGNOSIS — I10 Essential (primary) hypertension: Secondary | ICD-10-CM | POA: Diagnosis not present

## 2023-04-07 ENCOUNTER — Other Ambulatory Visit: Payer: Self-pay | Admitting: Internal Medicine

## 2023-04-07 DIAGNOSIS — F324 Major depressive disorder, single episode, in partial remission: Secondary | ICD-10-CM

## 2023-04-07 DIAGNOSIS — F411 Generalized anxiety disorder: Secondary | ICD-10-CM

## 2023-04-09 NOTE — Telephone Encounter (Signed)
Please review.  KP

## 2023-04-09 NOTE — Telephone Encounter (Signed)
Requested medication (s) are due for refill today: yes   Requested medication (s) are on the active medication list: yes   Last refill:  12/02/22 #60 3 refills   Future visit scheduled: yes in 4 months   Notes to clinic:  not delegated per protocol. Do you want to refill Rx?     Requested Prescriptions  Pending Prescriptions Disp Refills   ALPRAZolam (XANAX) 0.25 MG tablet [Pharmacy Med Name: ALPRAZOLAM 0.25 MG TABLET] 60 tablet 3    Sig: TAKE 1 TABLET BY MOUTH TWICE A DAY AS NEEDED     Not Delegated - Psychiatry: Anxiolytics/Hypnotics 2 Failed - 04/07/2023  1:04 PM      Failed - This refill cannot be delegated      Failed - Urine Drug Screen completed in last 360 days      Passed - Patient is not pregnant      Passed - Valid encounter within last 6 months    Recent Outpatient Visits           1 month ago Cellulitis of right ear   [place] [facility] at [facility], Nyoka Cowden, MD   2 months ago Essential hypertension   [place] [facility] at [facility], Nyoka Cowden, MD   8 months ago Annual physical exam   [facility] at [facility], Nyoka Cowden, MD   1 year ago Essential hypertension   [place] [facility] at [facility], Nyoka Cowden, MD   1 year ago Annual physical exam   [facility] at [facility], Nyoka Cowden, MD       Future Appointments             In 4 months Judithann Graves, Nyoka Cowden, MD [facility] at [facility]

## 2023-04-16 ENCOUNTER — Other Ambulatory Visit: Payer: Self-pay | Admitting: Internal Medicine

## 2023-04-16 DIAGNOSIS — I69359 Hemiplegia and hemiparesis following cerebral infarction affecting unspecified side: Secondary | ICD-10-CM

## 2023-04-17 NOTE — Telephone Encounter (Signed)
Requested Prescriptions  Pending Prescriptions Disp Refills   clopidogrel (PLAVIX) 75 MG tablet [Pharmacy Med Name: CLOPIDOGREL 75 MG TABLET] 90 tablet 1    Sig: TAKE 1 TABLET BY MOUTH EVERY DAY     Hematology: Antiplatelets - clopidogrel Passed - 04/16/2023  1:30 AM      Passed - HCT in normal range and within 180 days    HCT  Date Value Ref Range Status  02/08/2023 39.9 36.0 - 46.0 % Final   Hematocrit  Date Value Ref Range Status  07/22/2018 40.0 34.0 - 46.6 % Final         Passed - HGB in normal range and within 180 days    Hemoglobin  Date Value Ref Range Status  02/08/2023 13.2 12.0 - 15.0 g/dL Final  56/38/7564 33.2 11.1 - 15.9 g/dL Final         Passed - PLT in normal range and within 180 days    Platelets  Date Value Ref Range Status  02/08/2023 240 150 - 400 K/uL Final  07/22/2018 222 150 - 450 x10E3/uL Final         Passed - Cr in normal range and within 360 days    Creatinine  Date Value Ref Range Status  02/23/2013 0.45 (L) 0.60 - 1.30 mg/dL Final   Creatinine, Ser  Date Value Ref Range Status  08/08/2022 0.46 0.44 - 1.00 mg/dL Final         Passed - Valid encounter within last 6 months    Recent Outpatient Visits           2 months ago Cellulitis of right ear   [place] [facility] at [facility], Nyoka Cowden, MD   2 months ago Essential hypertension   [place] [facility] at [facility], Nyoka Cowden, MD   8 months ago Annual physical exam   [place] [facility] at [facility], Nyoka Cowden, MD   1 year ago Essential hypertension   [place] [facility] at [facility], Nyoka Cowden, MD   1 year ago Annual physical exam   [facility] at [facility], Nyoka Cowden, MD       Future Appointments             In 4 months Judithann Graves, Nyoka Cowden, MD [facility] at  [facility], [facility]

## 2023-05-04 ENCOUNTER — Other Ambulatory Visit: Payer: Self-pay | Admitting: Internal Medicine

## 2023-05-04 DIAGNOSIS — K2100 Gastro-esophageal reflux disease with esophagitis, without bleeding: Secondary | ICD-10-CM

## 2023-05-07 NOTE — Telephone Encounter (Signed)
Requested Prescriptions  Pending Prescriptions Disp Refills   pantoprazole (PROTONIX) 40 MG tablet [Pharmacy Med Name: PANTOPRAZOLE SOD DR 40 MG TAB] 180 tablet 0    Sig: TAKE 1 TABLET BY MOUTH TWICE A DAY     Gastroenterology: Proton Pump Inhibitors Passed - 05/04/2023 12:53 PM      Passed - Valid encounter within last 12 months    Recent Outpatient Visits           2 months ago Cellulitis of right ear   [place] [facility] at [facility], Nyoka Cowden, MD   2 months ago Essential hypertension   [place] [facility] at [facility], Nyoka Cowden, MD   9 months ago Annual physical exam   [facility] at [facility], Nyoka Cowden, MD   1 year ago Essential hypertension   [place] [facility] at [facility], Nyoka Cowden, MD   1 year ago Annual physical exam   [facility] at [facility], Nyoka Cowden, MD       Future Appointments             In 1 week Elinor Parkinson, [place] [facility] [place], [place]   In 3 months Judithann Graves, Nyoka Cowden, MD [facility] at [facility], [facility]

## 2023-05-08 ENCOUNTER — Telehealth: Payer: Self-pay | Admitting: Internal Medicine

## 2023-05-08 NOTE — Telephone Encounter (Signed)
Copied from CRM [id number]. Topic: Medicare AWV >> May 08, 2023  1:13 PM Payton Doughty wrote: Reason for CRM: Called LVM 05/08/2023 to schedule Annual Wellness Visit  Verlee Rossetti; Care Guide Ambulatory Clinical Support [place] [facility] Direct Dial: [phone]

## 2023-05-14 ENCOUNTER — Ambulatory Visit: Payer: Self-pay | Admitting: Podiatry

## 2023-05-20 ENCOUNTER — Encounter: Payer: Self-pay | Admitting: Podiatry

## 2023-05-20 ENCOUNTER — Ambulatory Visit: Payer: Self-pay | Admitting: Podiatry

## 2023-05-20 ENCOUNTER — Ambulatory Visit: Payer: Self-pay

## 2023-05-20 DIAGNOSIS — M79676 Pain in unspecified toe(s): Secondary | ICD-10-CM

## 2023-05-20 DIAGNOSIS — L97511 Non-pressure chronic ulcer of other part of right foot limited to breakdown of skin: Secondary | ICD-10-CM | POA: Diagnosis not present

## 2023-05-20 DIAGNOSIS — B351 Tinea unguium: Secondary | ICD-10-CM

## 2023-05-20 MED ORDER — CLINDAMYCIN HCL 150 MG PO CAPS: 150.0000 mg | ORAL_CAPSULE | Freq: Two times a day (BID) | ORAL | 1 refills | Status: DC

## 2023-05-20 NOTE — Telephone Encounter (Signed)
     Chief Complaint: "I saw my foot doctor and he said to call Dr. Judithann Graves and have her put me on a fluid pill." Declines OV . "I Can't come in until after Christmas." Symptoms: Swelling both feet, ankles. "She knows about it." Frequency: Months Pertinent Negatives: Patient denies  Disposition: [] ED /[] Urgent Care (no appt availability in office) / [] Appointment(In office/virtual)/ []  [place] [facility]/ [] Home Care/ [x] Refused Recommended Disposition /[] [place] [facility]/ [x]  Follow-up with PCP Additional Notes: Please advise pt.  Reason for Disposition  [1] MILD swelling of both ankles (i.e., pedal edema) AND [2] new-onset or worsening  Answer Assessment - Initial Assessment Questions 1. ONSET: "When did the swelling start?" (e.g., minutes, hours, days)     Years 2. LOCATION: "What part of the leg is swollen?"  "Are both legs swollen or just one leg?"     Both 3. SEVERITY: "How bad is the swelling?" (e.g., localized; mild, moderate, severe)   - Localized: Small area of swelling localized to one leg.   - MILD pedal edema: Swelling limited to foot and ankle, pitting edema < 1/4 inch (6 mm) deep, rest and elevation eliminate most or all swelling.   - MODERATE edema: Swelling of lower leg to knee, pitting edema > 1/4 inch (6 mm) deep, rest and elevation only partially reduce swelling.   - SEVERE edema: Swelling extends above knee, facial or hand swelling present.      Mild ankle and feet 4. REDNESS: "Does the swelling look red or infected?"     Yes 5. PAIN: "Is the swelling painful to touch?" If Yes, ask: "How painful is it?"   (Scale 1-10; mild, moderate or severe)     None 6. FEVER: "Do you have a fever?" If Yes, ask: "What is it, how was it measured, and when did it start?"      No 7. CAUSE: "What do you think is causing the leg swelling?"     Unsure 8. MEDICAL HISTORY: "Do you have a history of blood clots (e.g., DVT), cancer, heart failure, kidney disease, or liver  failure?"     No 9. RECURRENT SYMPTOM: "Have you had leg swelling before?" If Yes, ask: "When was the last time?" "What happened that time?"     Yes 10. OTHER SYMPTOMS: "Do you have any other symptoms?" (e.g., chest pain, difficulty breathing)       No 11. PREGNANCY: "Is there any chance you are pregnant?" "When was your last menstrual period?"       No  Protocols used: Leg Swelling and Edema-A-AH

## 2023-05-20 NOTE — Progress Notes (Signed)
Subjective:  Patient ID: Valerie Hanna, female    DOB: 1940-09-03,  MRN: [id number] HPI Chief Complaint  Patient presents with   Debridement    Requesting toenail trim and 2nd toe right - rubbed a callus and open wound now, bleeds, keeps covered and wearing surgical shoe   New Patient (Initial Visit)    Est pt 2020    82 y.o. female presents with the above complaint.   ROS: Denies fever chills nausea vomit muscle aches pains calf pain back pain chest pain shortness of breath.  Past Medical History:  Diagnosis Date   Allergy    Anemia    Anxiety    Arthritis    Cancer (HCC)    melanoma  ON HER BACK MANY YRS AGO   Constipation    Dehydration with hyponatremia 05/26/2015   Depression    GERD (gastroesophageal reflux disease)    H/O hiatal hernia    Headache(784.0)    High cholesterol    History of kidney infection    Hypertension    no longer taking meds since stroke   Keratosis, inflamed seborrheic 01/18/2017   Osteoporosis    Pleurisy    being treated for this now 01/25/14 (left side)   Stroke (HCC)    2014, WITH LEFT SIDED WEAKNESS   Past Surgical History:  Procedure Laterality Date   ABDOMINAL HYSTERECTOMY     APPENDECTOMY     BACK SURGERY     kyphoplasty   bladder tack     CATARACT EXTRACTION W/PHACO Left 06/29/2021   Procedure: CATARACT EXTRACTION PHACO AND INTRAOCULAR LENS PLACEMENT (IOC) LEFT 11.45 01:25.4;  Surgeon: Lockie Mola, MD;  Location: [facility] ORS;  Service: Ophthalmology;  Laterality: Left;   CHOLECYSTECTOMY     COLONOSCOPY     DILATION AND CURETTAGE OF UTERUS     EYE SURGERY Right    cataract removed   EYE SURGERY Right    "bubble" put in for a hole in retina   FOOT SURGERY Bilateral    KYPHOPLASTY N/A 01/28/2014   Procedure: LUMBAR FOUR KYPHOPLASTY;  Surgeon: Coletta Memos, MD;  Location: [facility];  Service: Neurosurgery;  Laterality: N/A;  L4 Kyphoplasty   KYPHOPLASTY N/A 04/18/2016   Procedure: KYPHOPLASTY Thoracic eight-Thoracic  ten;  Surgeon: Coletta Memos, MD;  Location: [facility] OR;  Service: Neurosurgery;  Laterality: N/A;  KYPHOPLASTY T8-T10   TOTAL HIP ARTHROPLASTY Right 05/26/2015   Procedure: RIGHT BIPOLAR VS TOTAL HIP ANTERIOR APPROACH;  Surgeon: Kathryne Hitch, MD;  Location: [facility] OR;  Service: Orthopedics;  Laterality: Right;    Current Outpatient Medications:    clindamycin (CLEOCIN) 150 MG capsule, Take 1 capsule (150 mg total) by mouth in the morning and at bedtime., Disp: 30 capsule, Rfl: 1   Acetaminophen (ARTHRITIS PAIN PO), Take 2 tablets by mouth daily as needed (Arthritis)., Disp: , Rfl:    acetaminophen (TYLENOL) 500 MG tablet, Take 1,000 mg by mouth every 6 (six) hours as needed for moderate pain or mild pain., Disp: , Rfl:    albuterol (VENTOLIN HFA) 108 (90 Base) MCG/ACT inhaler, Inhale 2 puffs into the lungs every 6 (six) hours as needed for wheezing or shortness of breath., Disp: 8.5 each, Rfl: 2   alendronate (FOSAMAX) 70 MG tablet, TAKE 1 TABLET (70 MG TOTAL) BY MOUTH EVERY 7 DAYS WITH FULL GLASS WATER ON EMPTY STOMACH, Disp: 12 tablet, Rfl: 3   ALPRAZolam (XANAX) 0.25 MG tablet, TAKE 1 TABLET BY MOUTH TWICE A DAY AS NEEDED, Disp:  60 tablet, Rfl: 3   CALCIUM PO, Take 1 tablet by mouth daily., Disp: , Rfl:    Cholecalciferol (VITAMIN D3 PO), Take 1 tablet by mouth daily., Disp: , Rfl:    clopidogrel (PLAVIX) 75 MG tablet, TAKE 1 TABLET BY MOUTH EVERY DAY, Disp: 90 tablet, Rfl: 1   fexofenadine (ALLEGRA) 60 MG tablet, Take 60 mg by mouth daily., Disp: , Rfl:    lisinopril (ZESTRIL) 10 MG tablet, TAKE 1 TABLET BY MOUTH EVERY DAY, Disp: 90 tablet, Rfl: 3   mirtazapine (REMERON) 30 MG tablet, TAKE 1 TABLET BY MOUTH EVERYDAY AT BEDTIME, Disp: 90 tablet, Rfl: 3   Multiple Vitamin (MULTIVITAMIN WITH MINERALS) TABS tablet, Take 1 tablet by mouth daily., Disp: , Rfl:    pantoprazole (PROTONIX) 40 MG tablet, TAKE 1 TABLET BY MOUTH TWICE A DAY, Disp: 180 tablet, Rfl: 0   sertraline (ZOLOFT) 100 MG tablet,  TAKE 1 TABLET BY MOUTH EVERYDAY AT BEDTIME, Disp: 90 tablet, Rfl: 3   simvastatin (ZOCOR) 40 MG tablet, TAKE 1 TABLET BY MOUTH DAILY AT 6 PM., Disp: 90 tablet, Rfl: 1   traZODone (DESYREL) 50 MG tablet, TAKE 1 TABLET BY MOUTH EVERYDAY AT BEDTIME, Disp: 90 tablet, Rfl: 1   triamcinolone cream (KENALOG) 0.1 %, , Disp: , Rfl:   Allergies  Allergen Reactions   Cefuroxime Axetil Nausea And Vomiting   Aspirin Other (See Comments)    Kidney problems   Review of Systems Objective:  There were no vitals filed for this visit.  General: Well developed, nourished, in no acute distress, alert and oriented x3   Dermatological: Skin is warm, dry and supple bilateral. Nails x 10 are thick yellow dystrophic clinically mycotic painful; remaining integument appears unremarkable at this time. There are no open sores, no preulcerative lesions, no rash or signs of infection present.  PIPJ dorsal aspect second digit right foot appears to have has some skin breakdown measures the entire width of the toe at the PIPJ but does not probe deep.  Cellulitic process does appear to extend to the level of the metatarsal phalangeal joint.  No purulence no malodor.  Vascular: Dorsalis Pedis artery and Posterior Tibial artery pedal pulses are 2/4 bilateral with immedate capillary fill time. Pedal hair growth present. No varicosities and no lower extremity edema present bilateral.   Neruologic: Grossly intact via light touch bilateral. Vibratory intact via tuning fork bilateral. Protective threshold with Semmes Wienstein monofilament intact to all pedal sites bilateral. Patellar and Achilles deep tendon reflexes 2+ bilateral. No Babinski or clonus noted bilateral.   Musculoskeletal: No gross boney pedal deformities bilateral. No pain, crepitus, or limitation noted with foot and ankle range of motion bilateral. Muscular strength 5/5 in all groups tested bilateral.  Severe rigid hammertoe deformities bilateral  Gait: Unassisted,  Nonantalgic.    Radiographs:  None taken  Assessment & Plan:   Assessment: Hammertoe deformity with skin breakdown dorsal aspect second digit right foot cellulitic process.  Plan: Debridement of all necrotic tissue debridement of nails bilaterally.  Started her on antibiotics and soaking regimen.  Discussed staying in her [place] shoe.  Follow-up with her in a couple weeks make sure she is healing well.     Lynsey Ange T. [place], [place]

## 2023-05-20 NOTE — Telephone Encounter (Signed)
Please call pt to schedule an appt can send in any medication until seen.   KP

## 2023-05-21 ENCOUNTER — Telehealth: Payer: Self-pay | Admitting: Internal Medicine

## 2023-05-21 NOTE — Telephone Encounter (Signed)
Patient called in regards to the request for clindamycin. Advised that medication was sent to the pharmacy on yesterday by her foot doctor. She says that's not what she's wanting. She says she has leg swelling and Dr. Al Corpus told her to call Dr. Judithann Graves regarding the leg swelling because she will need medication. She says she was called this morning and was told Dr. Judithann Graves was sending in medication for her leg swelling, but nothing has been sent. Advised I can schedule a sooner appointment than January. She says she can't come in until after Christmas. Advised I will send this to Dr. Judithann Graves. She asks if someone will call her to let her know what Dr. Judithann Graves says.

## 2023-05-21 NOTE — Telephone Encounter (Signed)
Medication Refill -  Most Recent Primary Care Visit:  Provider: Reubin Milan  Department: [facility]  Visit Type: OFFICE VISIT  Date: 02/15/2023  Medication: clindamycin (CLEOCIN) 150 MG capsule [[id number]]   Has the patient contacted their pharmacy? Yes  (Agent: If yes, when and what did the pharmacy advise?) Call office   Is this the correct pharmacy for this prescription? Yes  This is the patient's preferred pharmacy:  [id number] - [place], [place] - [address] [place] [address] Phone: [phone] Fax: [phone]   Pt says the medication is not at the pharmacy. Pharmacy has no record of the prescription.   Has the prescription been filled recently? No  Is the patient out of the medication? Yes  Has the patient been seen for an appointment in the last year OR does the patient have an upcoming appointment? Yes  Can we respond through MyChart? No  Agent: Please be advised that Rx refills may take up to 3 business days. We ask that you follow-up with your pharmacy.

## 2023-05-22 NOTE — Telephone Encounter (Signed)
Patient informed.  Valerie Hanna 

## 2023-06-03 ENCOUNTER — Ambulatory Visit: Payer: PPO | Admitting: Podiatry

## 2023-06-15 ENCOUNTER — Other Ambulatory Visit: Payer: Self-pay | Admitting: Internal Medicine

## 2023-06-15 DIAGNOSIS — F324 Major depressive disorder, single episode, in partial remission: Secondary | ICD-10-CM

## 2023-06-17 ENCOUNTER — Ambulatory Visit (INDEPENDENT_AMBULATORY_CARE_PROVIDER_SITE_OTHER): Payer: PPO | Admitting: Internal Medicine

## 2023-06-17 ENCOUNTER — Encounter: Payer: Self-pay | Admitting: Internal Medicine

## 2023-06-17 VITALS — BP 124/70 | HR 110 | Ht 61.0 in | Wt 123.0 lb

## 2023-06-17 DIAGNOSIS — E785 Hyperlipidemia, unspecified: Secondary | ICD-10-CM | POA: Diagnosis not present

## 2023-06-17 DIAGNOSIS — L97511 Non-pressure chronic ulcer of other part of right foot limited to breakdown of skin: Secondary | ICD-10-CM | POA: Diagnosis not present

## 2023-06-17 DIAGNOSIS — R0602 Shortness of breath: Secondary | ICD-10-CM | POA: Diagnosis not present

## 2023-06-17 DIAGNOSIS — F324 Major depressive disorder, single episode, in partial remission: Secondary | ICD-10-CM | POA: Diagnosis not present

## 2023-06-17 DIAGNOSIS — F411 Generalized anxiety disorder: Secondary | ICD-10-CM

## 2023-06-17 DIAGNOSIS — I69359 Hemiplegia and hemiparesis following cerebral infarction affecting unspecified side: Secondary | ICD-10-CM | POA: Diagnosis not present

## 2023-06-17 DIAGNOSIS — I10 Essential (primary) hypertension: Secondary | ICD-10-CM

## 2023-06-17 MED ORDER — LISINOPRIL 10 MG PO TABS: 10.0000 mg | ORAL_TABLET | Freq: Every day | ORAL | 3 refills | Status: AC

## 2023-06-17 MED ORDER — SERTRALINE HCL 100 MG PO TABS: 100.0000 mg | ORAL_TABLET | Freq: Every day | ORAL | 3 refills | Status: DC

## 2023-06-17 MED ORDER — ALBUTEROL SULFATE HFA 108 (90 BASE) MCG/ACT IN AERS: 2.0000 | INHALATION_SPRAY | Freq: Four times a day (QID) | RESPIRATORY_TRACT | 2 refills | Status: DC | PRN

## 2023-06-17 MED ORDER — SIMVASTATIN 40 MG PO TABS: 40.0000 mg | ORAL_TABLET | Freq: Every day | ORAL | 1 refills | Status: DC

## 2023-06-17 MED ORDER — TRAZODONE HCL 50 MG PO TABS: 50.0000 mg | ORAL_TABLET | Freq: Every evening | ORAL | 1 refills | Status: DC | PRN

## 2023-06-17 MED ORDER — ALPRAZOLAM 0.5 MG PO TABS: 0.5000 mg | ORAL_TABLET | Freq: Two times a day (BID) | ORAL | 3 refills | Status: DC | PRN

## 2023-06-17 NOTE — Assessment & Plan Note (Signed)
 Clinically stable on Remeron, Trazodone and Xanax with good response, No SI or HI reported. Slightly more anxiety since her dog passed away. Will increase Xanax dose slightly to 0.5 mg bid.

## 2023-06-17 NOTE — Assessment & Plan Note (Signed)
 Controlled BP with normal exam. Current regimen is lisinopril. Will continue same medications; encourage continued reduced sodium diet.
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
## 2023-06-17 NOTE — Assessment & Plan Note (Signed)
 Doing well on PRN Albuterol

## 2023-06-17 NOTE — Assessment & Plan Note (Signed)
 On Simvastatin without side effects. Lab Results  Component Value Date   LDLCALC 71 08/08/2022  Will check at next visit.

## 2023-06-19 ENCOUNTER — Ambulatory Visit: Payer: PPO | Admitting: Podiatry

## 2023-06-24 ENCOUNTER — Encounter: Payer: Self-pay | Admitting: Podiatry

## 2023-06-24 ENCOUNTER — Ambulatory Visit: Payer: PPO | Admitting: Podiatry

## 2023-06-24 VITALS — Ht 61.0 in | Wt 123.0 lb

## 2023-06-24 DIAGNOSIS — L97511 Non-pressure chronic ulcer of other part of right foot limited to breakdown of skin: Secondary | ICD-10-CM | POA: Diagnosis not present

## 2023-06-24 NOTE — Progress Notes (Signed)
  Subjective:  Patient ID: Valerie Hanna, female    DOB: [birth year],  MRN: [id number]  Chief Complaint  Patient presents with   Wound Check    Pt is here to f/u on spot on second toe on her right foot.    83 y.o. female presents with the above complaint. History confirmed with patient.  She notes it is doing better not having pain or drainage  Objective:  Physical Exam: warm, good capillary refill, no trophic changes or ulcerative lesions, normal DP and PT pulses, normal sensory exam, and right second toe dorsal PIPJ ulceration has healed there are no signs of recurrence or infection she has rigid hammertoe contracture   Assessment   1. Ulcer of right second toe, limited to breakdown of skin [facility])      Plan:  Patient was evaluated and treated and all questions answered.  Ulceration is healed I do not see further indication for any wound care or oral antibiotics at this point I did dispense her silicone digital corn pads to offload the area to prevent recurrence and she may utilize these.  Okay to return to regular shoe gear from surgical shoe.  Return to see [place] as needed.  Return if symptoms worsen or fail to improve.

## 2023-06-24 NOTE — Patient Instructions (Signed)
More silicone pads can be purchased from:  [email or web address]  

## 2023-07-09 DIAGNOSIS — Z961 Presence of intraocular lens: Secondary | ICD-10-CM | POA: Diagnosis not present

## 2023-07-09 DIAGNOSIS — H35341 Macular cyst, hole, or pseudohole, right eye: Secondary | ICD-10-CM | POA: Diagnosis not present

## 2023-07-09 DIAGNOSIS — H26492 Other secondary cataract, left eye: Secondary | ICD-10-CM | POA: Diagnosis not present

## 2023-07-09 DIAGNOSIS — H35372 Puckering of macula, left eye: Secondary | ICD-10-CM | POA: Diagnosis not present

## 2023-07-17 ENCOUNTER — Other Ambulatory Visit: Payer: Self-pay | Admitting: Internal Medicine

## 2023-07-17 DIAGNOSIS — F324 Major depressive disorder, single episode, in partial remission: Secondary | ICD-10-CM

## 2023-07-17 NOTE — Telephone Encounter (Signed)
Requested Prescriptions  Pending Prescriptions Disp Refills   mirtazapine (REMERON) 30 MG tablet [Pharmacy Med Name: MIRTAZAPINE 30 MG TABLET] 90 tablet 1    Sig: TAKE 1 TABLET BY MOUTH EVERYDAY AT BEDTIME     Psychiatry: Antidepressants - mirtazapine Passed - 07/17/2023  3:15 PM      Passed - Completed PHQ-2 or PHQ-9 in the last 360 days      Passed - Valid encounter within last 6 months    Recent Outpatient Visits           1 month ago Skin ulcer of right great toe, limited to breakdown of skin [facility])   [place] [facility] at [facility], Nyoka Cowden, MD   5 months ago Cellulitis of right ear   [facility] Care & [facility] Medicine at [facility], Nyoka Cowden, MD   5 months ago Essential hypertension   [place] [facility] at [facility], Nyoka Cowden, MD   11 months ago Annual physical exam   [facility] Primary Care & Sports Medicine at [facility], Nyoka Cowden, MD   1 year ago Essential hypertension   [place] [facility] at [facility], Nyoka Cowden, MD       Future Appointments             In 4 weeks Judithann Graves Nyoka Cowden, MD [facility] Primary Care & [facility] Medicine at [facility]

## 2023-08-15 ENCOUNTER — Ambulatory Visit (INDEPENDENT_AMBULATORY_CARE_PROVIDER_SITE_OTHER): Payer: Self-pay | Admitting: Internal Medicine

## 2023-08-15 ENCOUNTER — Encounter: Payer: Self-pay | Admitting: Internal Medicine

## 2023-08-15 VITALS — BP 122/74 | HR 98 | Ht 61.0 in | Wt 129.0 lb

## 2023-08-15 DIAGNOSIS — E785 Hyperlipidemia, unspecified: Secondary | ICD-10-CM

## 2023-08-15 DIAGNOSIS — D6869 Other thrombophilia: Secondary | ICD-10-CM | POA: Diagnosis not present

## 2023-08-15 DIAGNOSIS — Z0000 Encounter for general adult medical examination without abnormal findings: Secondary | ICD-10-CM | POA: Diagnosis not present

## 2023-08-15 DIAGNOSIS — I69359 Hemiplegia and hemiparesis following cerebral infarction affecting unspecified side: Secondary | ICD-10-CM

## 2023-08-15 DIAGNOSIS — F324 Major depressive disorder, single episode, in partial remission: Secondary | ICD-10-CM | POA: Diagnosis not present

## 2023-08-15 DIAGNOSIS — I10 Essential (primary) hypertension: Secondary | ICD-10-CM

## 2023-08-15 DIAGNOSIS — M8000XD Age-related osteoporosis with current pathological fracture, unspecified site, subsequent encounter for fracture with routine healing: Secondary | ICD-10-CM

## 2023-08-15 DIAGNOSIS — K2100 Gastro-esophageal reflux disease with esophagitis, without bleeding: Secondary | ICD-10-CM

## 2023-08-15 MED ORDER — PANTOPRAZOLE SODIUM 40 MG PO TBEC: 40.0000 mg | DELAYED_RELEASE_TABLET | Freq: Two times a day (BID) | ORAL | 0 refills | Status: DC

## 2023-08-15 NOTE — Assessment & Plan Note (Signed)
Symptoms controlled on PPI

## 2023-08-15 NOTE — Assessment & Plan Note (Signed)
 Plavix therapy

## 2023-08-15 NOTE — Assessment & Plan Note (Signed)
 Now on Alendronate after worsening DEXA last year. No side effects noted. Would repeat DEXA next year

## 2023-08-15 NOTE — Assessment & Plan Note (Signed)
 Controlled BP with normal exam. Current regimen is lisinopril. Will continue same medications; encourage continued reduced sodium diet.

## 2023-08-15 NOTE — Assessment & Plan Note (Signed)
 LDL is  Lab Results  Component Value Date   LDLCALC 71 08/08/2022   Current regimen is simvastatin.  Tolerating medications well without issues.

## 2023-08-15 NOTE — Assessment & Plan Note (Signed)
 Chronic stable symptoms On Remeron, Sertraline, Trazodone and Xanax

## 2023-08-15 NOTE — Assessment & Plan Note (Signed)
 Stable without change Ambulatory issues addressed with rollator Continues on Plavix for secondary prevention
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
## 2023-08-16 LAB — COMPREHENSIVE METABOLIC PANEL
ALT: 14 IU/L (ref 0–32)
AST: 25 IU/L (ref 0–40)
Albumin: 4.4 g/dL (ref 3.8–4.8)
Alkaline Phosphatase: 69 IU/L (ref 44–121)
BUN/Creatinine Ratio: 18 (ref 12–28)
BUN: 9 mg/dL (ref 8–27)
Bilirubin Total: 0.3 mg/dL (ref 0.0–1.2)
CO2: 24 mmol/L (ref 20–29)
Calcium: 9.5 mg/dL (ref 8.7–10.3)
Chloride: 95 mmol/L — ABNORMAL LOW (ref 96–106)
Creatinine, Ser: 0.5 mg/dL — ABNORMAL LOW (ref 0.57–1.00)
Globulin, Total: 2.4 g/dL (ref 1.5–4.5)
Glucose: 98 mg/dL (ref 70–99)
Potassium: 4.9 mmol/L (ref 3.5–5.2)
Sodium: 135 mmol/L (ref 134–144)
Total Protein: 6.8 g/dL (ref 6.0–8.5)
eGFR: 95 mL/min/{1.73_m2} (ref >59)

## 2023-08-16 LAB — CBC WITH DIFFERENTIAL/PLATELET
Basophils Absolute: 0.1 10*3/uL (ref 0.0–0.2)
Basos: 1 %
EOS (ABSOLUTE): 0.2 10*3/uL (ref 0.0–0.4)
Eos: 2 %
Hematocrit: 41.7 % (ref 34.0–46.6)
Hemoglobin: 13.7 g/dL (ref 11.1–15.9)
Immature Grans (Abs): 0 10*3/uL (ref 0.0–0.1)
Immature Granulocytes: 0 %
Lymphocytes Absolute: 1.4 10*3/uL (ref 0.7–3.1)
Lymphs: 21 %
MCH: 34.2 pg — ABNORMAL HIGH (ref 26.6–33.0)
MCHC: 32.9 g/dL (ref 31.5–35.7)
MCV: 104 fL — ABNORMAL HIGH (ref 79–97)
Monocytes Absolute: 0.5 10*3/uL (ref 0.1–0.9)
Monocytes: 8 %
Neutrophils Absolute: 4.5 10*3/uL (ref 1.4–7.0)
Neutrophils: 68 %
Platelets: 242 10*3/uL (ref 150–450)
RBC: 4.01 x10E6/uL (ref 3.77–5.28)
RDW: 11.3 % — ABNORMAL LOW (ref 11.7–15.4)
WBC: 6.6 10*3/uL (ref 3.4–10.8)

## 2023-08-16 LAB — LIPID PANEL
Chol/HDL Ratio: 2.3 ratio (ref 0.0–4.4)
Cholesterol, Total: 196 mg/dL (ref 100–199)
HDL: 84 mg/dL (ref >39)
LDL Chol Calc (NIH): 91 mg/dL (ref 0–99)
Triglycerides: 121 mg/dL (ref 0–149)
VLDL Cholesterol Cal: 21 mg/dL (ref 5–40)

## 2023-08-16 LAB — TSH: TSH: 2.22 u[IU]/mL (ref 0.450–4.500)

## 2023-09-08 ENCOUNTER — Other Ambulatory Visit: Payer: Self-pay | Admitting: Internal Medicine

## 2023-09-08 DIAGNOSIS — R0602 Shortness of breath: Secondary | ICD-10-CM

## 2023-09-09 NOTE — Telephone Encounter (Signed)
 Requested Prescriptions  Pending Prescriptions Disp Refills   albuterol (VENTOLIN HFA) 108 (90 Base) MCG/ACT inhaler [Pharmacy Med Name: ALBUTEROL HFA (VENTOLIN) INH] 8 each 2    Sig: TAKE 2 PUFFS BY MOUTH EVERY 6 HOURS AS NEEDED FOR WHEEZE OR SHORTNESS OF BREATH     Pulmonology:  Beta Agonists 2 Passed - 09/09/2023  4:51 PM      Passed - Last BP in normal range    BP Readings from Last 1 Encounters:  08/15/23 122/74         Passed - Last Heart Rate in normal range    Pulse Readings from Last 1 Encounters:  08/15/23 98         Passed - Valid encounter within last 12 months    Recent Outpatient Visits           3 weeks ago Annual physical exam   [facility] Health Primary Care & Sports Medicine at [facility], Nyoka Cowden, MD       Future Appointments             In 4 months Judithann Graves, Nyoka Cowden, MD [facility] Health Primary Care & Sports Medicine at [facility], [facility]

## 2023-09-19 ENCOUNTER — Ambulatory Visit: Payer: Self-pay
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
## 2023-09-20 ENCOUNTER — Other Ambulatory Visit: Payer: Self-pay

## 2023-09-20 ENCOUNTER — Emergency Department

## 2023-09-20 ENCOUNTER — Emergency Department
Admission: EM | Admit: 2023-09-20 | Discharge: 2023-09-20 | Disposition: A | Discharge status: Home / Self Care | Attending: Emergency Medicine | Admitting: Emergency Medicine

## 2023-09-20 DIAGNOSIS — M545 Low back pain, unspecified: Secondary | ICD-10-CM | POA: Diagnosis not present

## 2023-09-20 DIAGNOSIS — I10 Essential (primary) hypertension: Secondary | ICD-10-CM | POA: Diagnosis not present

## 2023-09-20 DIAGNOSIS — R531 Weakness: Secondary | ICD-10-CM | POA: Diagnosis not present

## 2023-09-20 DIAGNOSIS — R109 Unspecified abdominal pain: Secondary | ICD-10-CM | POA: Diagnosis not present

## 2023-09-20 DIAGNOSIS — R000 Tachycardia, unspecified: Secondary | ICD-10-CM | POA: Diagnosis not present

## 2023-09-20 DIAGNOSIS — R29890 Loss of height: Secondary | ICD-10-CM | POA: Diagnosis not present

## 2023-09-20 DIAGNOSIS — W19XXXA Unspecified fall, initial encounter: Secondary | ICD-10-CM

## 2023-09-20 DIAGNOSIS — S3992XA Unspecified injury of lower back, initial encounter: Secondary | ICD-10-CM | POA: Diagnosis not present

## 2023-09-20 DIAGNOSIS — M4316 Spondylolisthesis, lumbar region: Secondary | ICD-10-CM | POA: Diagnosis not present

## 2023-09-20 DIAGNOSIS — K429 Umbilical hernia without obstruction or gangrene: Secondary | ICD-10-CM | POA: Diagnosis not present

## 2023-09-20 DIAGNOSIS — I69354 Hemiplegia and hemiparesis following cerebral infarction affecting left non-dominant side: Secondary | ICD-10-CM | POA: Insufficient documentation

## 2023-09-20 DIAGNOSIS — N200 Calculus of kidney: Secondary | ICD-10-CM | POA: Diagnosis not present

## 2023-09-20 DIAGNOSIS — W1839XA Other fall on same level, initial encounter: Secondary | ICD-10-CM | POA: Insufficient documentation

## 2023-09-20 DIAGNOSIS — Z8673 Personal history of transient ischemic attack (TIA), and cerebral infarction without residual deficits: Secondary | ICD-10-CM | POA: Insufficient documentation

## 2023-09-20 DIAGNOSIS — M48061 Spinal stenosis, lumbar region without neurogenic claudication: Secondary | ICD-10-CM | POA: Diagnosis not present

## 2023-09-20 LAB — CBC
HCT: 40.4 % (ref 36.0–46.0)
Hemoglobin: 13.2 g/dL (ref 12.0–15.0)
MCH: 33.3 pg (ref 26.0–34.0)
MCHC: 32.7 g/dL (ref 30.0–36.0)
MCV: 102 fL — ABNORMAL HIGH (ref 80.0–100.0)
Platelets: 216 10*3/uL (ref 150–400)
RBC: 3.96 MIL/uL (ref 3.87–5.11)
RDW: 12 % (ref 11.5–15.5)
WBC: 7.7 10*3/uL (ref 4.0–10.5)
nRBC: 0 % (ref 0.0–0.2)

## 2023-09-20 LAB — BASIC METABOLIC PANEL WITH GFR
Anion gap: 11 (ref 5–15)
BUN: 8 mg/dL (ref 8–23)
CO2: 27 mmol/L (ref 22–32)
Calcium: 9.1 mg/dL (ref 8.9–10.3)
Chloride: 98 mmol/L (ref 98–111)
Creatinine, Ser: 0.32 mg/dL — ABNORMAL LOW (ref 0.44–1.00)
GFR, Estimated: >60 mL/min (ref >60)
Glucose, Bld: 133 mg/dL — ABNORMAL HIGH (ref 70–99)
Potassium: 4.3 mmol/L (ref 3.5–5.1)
Sodium: 136 mmol/L (ref 135–145)

## 2023-09-20 LAB — ETHANOL: Alcohol, Ethyl (B): <10 mg/dL (ref <10)

## 2023-09-20 MED ORDER — SODIUM CHLORIDE 0.9 % IV BOLUS
1000.0000 mL | Freq: Once | INTRAVENOUS | Status: AC
  Administered 2023-09-20: 1000 mL via INTRAVENOUS

## 2023-09-20 MED ORDER — OXYCODONE-ACETAMINOPHEN 5-325 MG PO TABS: 1.0000 | ORAL_TABLET | ORAL | 0 refills | Status: DC | PRN

## 2023-09-20 MED ORDER — HYDROMORPHONE HCL 1 MG/ML IJ SOLN
0.5000 mg | Freq: Once | INTRAMUSCULAR | Status: AC
  Administered 2023-09-20: 0.5 mg via INTRAVENOUS
  Filled 2023-09-20: qty 0.5

## 2023-09-20 NOTE — ED Notes (Signed)
Pt assisted with using bedpan.

## 2023-09-20 NOTE — ED Provider Notes (Signed)
 [facility] Provider Note    Event Date/Time   First MD Initiated Contact with Patient 09/20/23 1009     (approximate)  History   Chief Complaint: Fall  HPI  Valerie Hanna is a 83 y.o. female with a past medical history of anemia, anxiety, gastric reflux, hypertension, prior CVA with mild left-sided weakness presents to the emergency department for back pain.  According to the patient on Monday night she had a fall landing on her buttocks.  She states since that time she has had pain to her left back and lower back.  States at times the pain will wrap around to her left flank.  Patient has been able to ambulate although with increasing pain per patient.  Denies hitting her head denies anticoagulation.  No headache.  Patient does state somewhat frequent alcohol use.  Physical Exam   Triage Vital Signs: ED Triage Vitals  Encounter Vitals Group     BP      Systolic BP Percentile      Diastolic BP Percentile      Pulse      Resp      Temp      Temp src      SpO2      Weight      Height      Head Circumference      Peak Flow      Pain Score      Pain Loc      Pain Education      Exclude from Growth Chart     Most recent vital signs: There were no vitals filed for this visit.  General: Awake, no distress.  CV:  Good peripheral perfusion.  Regular rate and rhythm  Resp:  Normal effort.  Equal breath sounds bilaterally.  Abd:  No distention.  Soft, nontender.  No rebound or guarding. Other:  Good range of motion bilateral lower extremities including both hips.  Good range of motion upper extremities.  No signs of head trauma.  No CT or L-spine tenderness.  No obvious deformity or step-off.   ED Results / Procedures / Treatments   RADIOLOGY  I have reviewed and interpreted CT images no obvious fracture or kidney stone seen on my evaluation. Radiology has read the CT scan is negative for acute abnormality in the abdomen. Negative for acute  fracture in the lumbar spine.  Patient does have prior kyphoplasties.  EKG viewed and interpreted by myself shows sinus tachycardia at 102 bpm with a narrow QRS, normal axis, normal intervals, no concerning ST changes.  MEDICATIONS ORDERED IN ED: Medications  sodium chloride  0.9 % bolus 1,000 mL (has no administration in time range)     IMPRESSION / MDM / ASSESSMENT AND PLAN / ED COURSE  I reviewed the triage vital signs and the nursing notes.  Patient's presentation is most consistent with acute presentation with potential threat to life or bodily function.  Patient presents emergency department for worsening back and left flank pain since a fall Monday night.  Patient states she has been able to ambulate although with increasing pain.  Patient points more to her left SI joint area/flank but states some pain radiating from the lower back.  We will check labs we will obtain a CT renal scan to evaluate for the flank pain as well as a CT lumbar scan to evaluate for possible compression fracture endplate fracture, etc.  CT scans are negative for acute abnormality, lab work  shows a reassuring CBC, reassuring chemistry negative ethanol.  Suspect likely musculoskeletal or possible neuropathic pain.  Will have the patient follow-up with neurosurgery for further evaluation.  We will treat with pain medication.  Patient agreeable to plan of care.  FINAL CLINICAL IMPRESSION(S) / ED DIAGNOSES   Low back pain Fall   Note:  This document was prepared using Dragon voice recognition software and may include unintentional dictation errors.   Ruth Cove, MD 09/20/23 1409

## 2023-09-20 NOTE — Discharge Instructions (Addendum)
 Please call the number provided for neurosurgery to arrange a follow-up appointment regarding your back pain.  Please take your pain medication as needed but only as prescribed.  Do not drink alcohol or drive while taking this medication.  Do not take this medication within 4 hours of taking your prescribed alprazolam /Xanax  medication.
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
## 2023-09-20 NOTE — ED Notes (Signed)
 This RN attempted to call pt's spouse to inform of discharge. No answer and no voicemail available.

## 2023-09-30 ENCOUNTER — Ambulatory Visit: Payer: Self-pay | Admitting: Internal Medicine

## 2023-09-30 NOTE — Telephone Encounter (Signed)
 Chief Complaint: Lower back pain after a fall on 4/18  Symptoms: Pain with breathing, 8-9/10 pain level in lower back is constant Pertinent Negatives: Patient denies pain radiation, numbness  Disposition: / [x] Appointment(In office/virtual)/ [x] Refused Recommended Disposition   Additional Notes: Pt states she fell in the bathroom on 4/18. Pt went to ED. Pt states she is out of pain medication but refuses an appointment. Pt requests a call back from clinic at [phone].  Copied from CRM [id number]. Topic: Clinical - Red Word Triage >> Sep 30, 2023  3:44 PM Star East wrote: Red Word that prompted transfer to Nurse Triage: fell on 4/18 and went to ER, has lower back pain level 8 - 9, hurts to take deep breath Reason for Disposition  [1] SEVERE back pain (e.g., excruciating, unable to do any normal activities) AND [2] not improved 2 hours after pain medicine  Answer Assessment - Initial Assessment Questions ONSET: "When did the pain begin?"      4/18 LOCATION: "Where does it hurt?" (upper, mid or lower back)     Lower back SEVERITY: "How bad is the pain?"  (e.g., Scale 1-10; mild, moderate, or severe)   - MILD (1-3): Doesn't interfere with normal activities.    - MODERATE (4-7): Interferes with normal activities or awakens from sleep.    - SEVERE (8-10): Excruciating pain, unable to do any normal activities.      8 or 9 PATTERN: "Is the pain constant?" (e.g., yes, no; constant, intermittent)      Constant RADIATION: "Does the pain shoot into your legs or somewhere else?"     Stays in lower back MEDICINES: "What have you taken so far for the pain?" (e.g., nothing, acetaminophen , NSAIDS)     Denies, pt is out  NEUROLOGIC SYMPTOMS: "Do you have any weakness, numbness, or problems with bowel/bladder control?"     Denies  Protocols used: Back Pain-A-AH

## 2023-09-30 NOTE — Telephone Encounter (Signed)
 Pt needs an appt before any medication can be sent in.  KP

## 2023-10-08 NOTE — Progress Notes (Unsigned)
 Referring Physician:  Sheron Dixons, MD [address] [place],  [place] [address]  Primary Physician:  Sheron Dixons, MD  History of Present Illness: 10/09/2023 [name] has a history of HTN, GERD, CVA with left sided weakness, osteoporosis, scoliosis, dyslipidemia.   History of kyphoplasty T10-L4.   Seen in ED on 09/20/23 with increased back pain s/p fall on 09/16/23.   Since above fall, she has constant mid back pain that is getting worse. No leg pain. Pain is worse with walking and better with nothing. No numbness, tingling, or weakness. This pain feels similar to pain she's had with previous compression fractures.   She is on PLAVIX . Was given percocet from ED.   She quit smoking in 2017.   Bowel/Bladder Dysfunction: none  Conservative measures:  Physical therapy:  has not participated in PT Multimodal medical therapy including regular antiinflammatories:  tylenol , oxycodone  Injections:  no epidural steroid injections  Past Surgery:  Kyphoplasty (T10-L4)  The symptoms are causing a significant impact on the patient's life.   Review of Systems:  A 10 point review of systems is negative, except for the pertinent positives and negatives detailed in the HPI.  Past Medical History: Past Medical History:  Diagnosis Date   Allergy    Anemia    Anxiety    Arthritis    Cancer (HCC)    melanoma  ON HER BACK MANY YRS AGO   Constipation    Dehydration with hyponatremia 05/26/2015   Depression    GERD (gastroesophageal reflux disease)    H/O hiatal hernia    Headache(784.0)    High cholesterol    History of kidney infection    Hypertension    no longer taking meds since stroke   Keratosis, inflamed seborrheic 01/18/2017   Osteoporosis    Pleurisy    being treated for this now 01/25/14 (left side)   Stroke (HCC)    2014, WITH LEFT SIDED WEAKNESS    Past Surgical History: Past Surgical History:  Procedure Laterality Date   ABDOMINAL  HYSTERECTOMY     APPENDECTOMY     BACK SURGERY     kyphoplasty   bladder tack     CATARACT EXTRACTION W/PHACO Left 06/29/2021   Procedure: CATARACT EXTRACTION PHACO AND INTRAOCULAR LENS PLACEMENT (IOC) LEFT 11.45 01:25.4;  Surgeon: Annell Kidney, MD;  Location: [facility] ORS;  Service: Ophthalmology;  Laterality: Left;   CHOLECYSTECTOMY     COLONOSCOPY     DILATION AND CURETTAGE OF UTERUS     EYE SURGERY Right    cataract removed   EYE SURGERY Right    "bubble" put in for a hole in retina   FOOT SURGERY Bilateral    KYPHOPLASTY N/A 01/28/2014   Procedure: LUMBAR FOUR KYPHOPLASTY;  Surgeon: Audie Bleacher, MD;  Location: [facility];  Service: Neurosurgery;  Laterality: N/A;  L4 Kyphoplasty   KYPHOPLASTY N/A 04/18/2016   Procedure: KYPHOPLASTY Thoracic eight-Thoracic ten;  Surgeon: Audie Bleacher, MD;  Location: [facility] OR;  Service: Neurosurgery;  Laterality: N/A;  KYPHOPLASTY T8-T10   TOTAL HIP ARTHROPLASTY Right 05/26/2015   Procedure: RIGHT BIPOLAR VS TOTAL HIP ANTERIOR APPROACH;  Surgeon: Arnie Lao, MD;  Location: [facility] OR;  Service: Orthopedics;  Laterality: Right;    Allergies: Allergies as of 10/09/2023 - Review Complete 10/09/2023  Allergen Reaction Noted   Cefuroxime  axetil Nausea And Vomiting 07/22/2018   Aspirin Other (See Comments) 08/12/2013    Medications: Outpatient Encounter Medications as of 10/09/2023  Medication Sig   Acetaminophen  (ARTHRITIS PAIN PO) Take 2 tablets by mouth daily as needed (Arthritis).   acetaminophen  (TYLENOL ) 500 MG tablet Take 1,000 mg by mouth every 6 (six) hours as needed for moderate pain or mild pain.   albuterol  (VENTOLIN  HFA) 108 (90 Base) MCG/ACT inhaler TAKE 2 PUFFS BY MOUTH EVERY 6 HOURS AS NEEDED FOR WHEEZE OR SHORTNESS OF BREATH   ALPRAZolam  (XANAX ) 0.5 MG tablet Take 1 tablet (0.5 mg total) by mouth 2 (two) times daily as needed.   CALCIUM  PO Take 1 tablet by mouth daily.   Cholecalciferol (VITAMIN D3 PO) Take 1 tablet by mouth  daily.   clopidogrel  (PLAVIX ) 75 MG tablet TAKE 1 TABLET BY MOUTH EVERY DAY   fexofenadine (ALLEGRA) 60 MG tablet Take 60 mg by mouth daily.   lisinopril  (ZESTRIL ) 10 MG tablet Take 1 tablet (10 mg total) by mouth daily.   mirtazapine  (REMERON ) 30 MG tablet TAKE 1 TABLET BY MOUTH EVERYDAY AT BEDTIME   Multiple Vitamin (MULTIVITAMIN WITH MINERALS) TABS tablet Take 1 tablet by mouth daily.   pantoprazole  (PROTONIX ) 40 MG tablet Take 1 tablet (40 mg total) by mouth 2 (two) times daily.   sertraline  (ZOLOFT ) 100 MG tablet Take 1 tablet (100 mg total) by mouth daily.   simvastatin  (ZOCOR ) 40 MG tablet Take 1 tablet (40 mg total) by mouth daily at 6 PM.   traZODone  (DESYREL ) 50 MG tablet Take 1 tablet (50 mg total) by mouth at bedtime as needed for sleep.   triamcinolone  cream (KENALOG ) 0.1 %    [DISCONTINUED] alendronate  (FOSAMAX ) 70 MG tablet TAKE 1 TABLET (70 MG TOTAL) BY MOUTH EVERY 7 DAYS WITH FULL GLASS WATER  ON EMPTY STOMACH (Patient not taking: Reported on 10/09/2023)   [DISCONTINUED] oxyCODONE -acetaminophen  (PERCOCET) 5-325 MG tablet Take 1 tablet by mouth every 4 (four) hours as needed for severe pain (pain score 7-10). (Patient not taking: Reported on 10/09/2023)   No facility-administered encounter medications on file as of 10/09/2023.    Social History: Social History   Tobacco Use   Smoking status: Former    Current packs/day: 0.00    Average packs/day: 0.5 packs/day for 30.0 years (15.0 ttl pk-yrs)    Types: Cigarettes    Start date: 04/15/1986    Quit date: 04/15/2016    Years since quitting: 7.4   Smokeless tobacco: Never   Tobacco comments:    smoking cessation materials not required  Vaping Use   Vaping status: Never Used  Substance Use Topics   Alcohol use: Yes    Alcohol/week: 14.0 - 21.0 standard drinks of alcohol    Types: 14 - 21 Glasses of wine per week   Drug use: No    Family Medical History: Family History  Problem Relation Age of Onset   Healthy Sister     Healthy Sister     Physical Examination: Vitals:   10/09/23 1346  BP: 126/74    General: Patient is well developed, well nourished, calm, collected, and in no apparent distress. Attention to examination is appropriate.  Respiratory: Patient is breathing without any difficulty.   NEUROLOGICAL:     Awake, alert, oriented to person, place, and time.  Speech is clear and fluent. Fund of knowledge is appropriate.   Cranial Nerves: Pupils equal round and reactive to light.  Facial tone is symmetric.    She has mid to lower thoracic tenderness. No lumbar tenderness is noted.    No abnormal lesions on exposed skin.   Strength:  Side Biceps Triceps Deltoid Interossei Grip Wrist Ext. Wrist Flex.  R 5 5 5 5 5 5 5   L 5 5 5 5 5 5 5    Side Iliopsoas Quads Hamstring PF DF EHL  R 5 5 5 5 5 5   L 5 5 5 5 5 5    Reflexes are 2+ and symmetric at the biceps, brachioradialis, patella and achilles.   Hoffman's is absent.  Clonus is not present.   Bilateral upper and lower extremity sensation is intact to light touch.     She ambulates slowly with rollator. She has known scoliosis.   Medical Decision Making  Imaging: CT of lumbar spine dated 09/20/23:  FINDINGS: Segmentation: Conventional numbering is assumed with 5 non-rib-bearing, lumbar type vertebral bodies.   Alignment: Unchanged mild grade 1 anterolisthesis of L2 on L3.   Vertebrae: Post kyphoplasty changes from T10-L4 with unchanged varying degrees of vertebral body height loss. Normal L5 vertebral body height. No evidence of acute fracture. Diffusely demineralized appearance of the bones.   Paraspinal and other soft tissues: Atherosclerotic calcifications of the abdominal aorta and its branches.   Disc levels: Prior left hemilaminotomy and medial facetectomy at L4-5. No high-grade spinal canal stenosis at any level. Unchanged posterior bowing of the superior endplate of L3 resulting in mild spinal canal stenosis at L2-3.  Severe right neural foraminal narrowing at L3-4 and L5-S1.   IMPRESSION: 1. No evidence of acute fracture or traumatic malalignment of the lumbar spine. 2. Post kyphoplasty changes from T10-L4 with unchanged varying degrees of vertebral body height loss. 3. Severe right neural foraminal narrowing at L3-4 and L5-S1.     Electronically Signed   By: Audra Blend M.D.   On: 09/20/2023 13:10    I have personally reviewed the images and agree with the above interpretation.  Assessment and Plan: Ms. Mauzy has a history of kyphoplasty T10-L4.   Had a fall on 09/16/23 and she's had increased back pain since that time.   She has constant mid back pain that is getting worse. No leg pain. Pain is worse with walking and better with nothing. No numbness, tingling, or weakness. This pain feels similar to pain she's had with previous compression fractures.   CT of lumbar spine from ED shows previous kyphoplasty T10-L4. No compression fractures notes. She has known TL scoliosis and spondylosis.   She has tenderness in her mid to lower thoracic spine. No lower lumbar tenderness. I am suspicious for a new thoracic compression fracture.   Treatment options discussed with patient and following plan made:   - MRI of thoracic spine to evaluate for new compression fracture s/p fall.  - Refill given on oxycodone  to take for severe pain. Reviewed dosing and side effects. Take only as needed. She was given this from ED and did well with it. PMP reviewed and is appropriate.  - Will schedule phone visit to review MRI results once I get them back.   I spent a total of 30 minutes in face-to-face and non-face-to-face activities related to this patient's care today including review of outside records, review of imaging, review of symptoms, physical exam, discussion of differential diagnosis, discussion of treatment options, and documentation.   Thank you for involving me in the care of this patient.    Lucetta Russel PA-C Dept. of Neurosurgery

## 2023-10-09 ENCOUNTER — Ambulatory Visit: Admitting: Orthopedic Surgery

## 2023-10-09 ENCOUNTER — Encounter: Payer: Self-pay | Admitting: Orthopedic Surgery

## 2023-10-09 VITALS — BP 126/74 | Ht 61.0 in | Wt 125.0 lb

## 2023-10-09 DIAGNOSIS — M546 Pain in thoracic spine: Secondary | ICD-10-CM

## 2023-10-09 DIAGNOSIS — W19XXXA Unspecified fall, initial encounter: Secondary | ICD-10-CM

## 2023-10-09 DIAGNOSIS — M4125 Other idiopathic scoliosis, thoracolumbar region: Secondary | ICD-10-CM

## 2023-10-09 DIAGNOSIS — Z9889 Other specified postprocedural states: Secondary | ICD-10-CM | POA: Diagnosis not present

## 2023-10-09 MED ORDER — OXYCODONE HCL 5 MG PO TABS: 2.5000 mg | ORAL_TABLET | Freq: Three times a day (TID) | ORAL | 0 refills | Status: DC | PRN

## 2023-10-09 NOTE — Patient Instructions (Addendum)
 It was so nice to see you today. Thank you so much for coming in.    The imaging of your lower back from the emergency room shows arthritis. I am worried you may have a broken bone in your mid back.   I want to get an MRI of your mid back to look into things further. We will get this approved through your insurance and [facility] will call you to schedule the appointment. Ask about your patient responsibility. You do not need to pay this prior to getting MRI, they can bill you.   After you have the MRI, it takes 14-21 days for me to get the results back. Once I have them, we will call you to schedule a follow up phone visit with me to review them.   I sent a refill of oxycodone  to your pharmacy to take for severe pain. Take this only as needed and take least amount possible. Remember it can make you feel sleepy, dizzy, and constipated. Make sure you don't go too many days without having a BM. You can use over the counter senokot or miralax  as directed on box/bottle.   Please do not hesitate to call if you have any questions or concerns. You can also message me in MyChart.   If you have not heard back about any of the above things in the next week, please call the office so we can help you get them scheduled.   Lucetta Russel PA-C [phone]     The physicians and staff at [facility] Neurosurgery at [facility] are committed to providing excellent care. You may receive a survey asking for feedback about your experience at our office. We value you your feedback and appreciate you taking the time to to fill it out. The [facility] leadership team is also available to discuss your experience in person, feel free to contact us  [phone].

## 2023-10-12 ENCOUNTER — Other Ambulatory Visit: Payer: Self-pay | Admitting: Internal Medicine

## 2023-10-12 DIAGNOSIS — I69359 Hemiplegia and hemiparesis following cerebral infarction affecting unspecified side: Secondary | ICD-10-CM

## 2023-10-15 NOTE — Telephone Encounter (Signed)
 Labs in date.  Requested Prescriptions  Pending Prescriptions Disp Refills   clopidogrel  (PLAVIX ) 75 MG tablet [Pharmacy Med Name: CLOPIDOGREL  75 MG TABLET] 90 tablet 1    Sig: TAKE 1 TABLET BY MOUTH EVERY DAY     Hematology: Antiplatelets - clopidogrel  Failed - 10/15/2023  9:54 AM      Failed - Cr in normal range and within 360 days    Creatinine  Date Value Ref Range Status  02/23/2013 0.45 (L) 0.60 - 1.30 mg/dL Final   Creatinine, Ser  Date Value Ref Range Status  09/20/2023 0.32 (L) 0.44 - 1.00 mg/dL Final         Passed - HCT in normal range and within 180 days    HCT  Date Value Ref Range Status  09/20/2023 40.4 36.0 - 46.0 % Final   Hematocrit  Date Value Ref Range Status  08/15/2023 41.7 34.0 - 46.6 % Final         Passed - HGB in normal range and within 180 days    Hemoglobin  Date Value Ref Range Status  09/20/2023 13.2 12.0 - 15.0 g/dL Final  16/03/9603 54.0 11.1 - 15.9 g/dL Final         Passed - PLT in normal range and within 180 days    Platelets  Date Value Ref Range Status  09/20/2023 216 150 - 400 K/uL Final  08/15/2023 242 150 - 450 x10E3/uL Final         Passed - Valid encounter within last 6 months    Recent Outpatient Visits           2 months ago Annual physical exam   [facility] Primary Care & Sports Medicine at [facility], Chales [place], MD       Future Appointments             In 3 months Gala Jubilee [place], MD [facility] Primary Care & Sports Medicine at [facility], [facility]

## 2023-10-21 ENCOUNTER — Ambulatory Visit
Admission: RE | Admit: 2023-10-21 | Discharge: 2023-10-21 | Disposition: A | Discharge status: Home / Self Care | Source: Ambulatory Visit | Attending: Orthopedic Surgery | Admitting: Orthopedic Surgery

## 2023-10-21 DIAGNOSIS — M546 Pain in thoracic spine: Secondary | ICD-10-CM

## 2023-10-21 DIAGNOSIS — Z9889 Other specified postprocedural states: Secondary | ICD-10-CM | POA: Diagnosis not present

## 2023-10-21 DIAGNOSIS — Z981 Arthrodesis status: Secondary | ICD-10-CM | POA: Diagnosis not present

## 2023-10-21 DIAGNOSIS — S22070A Wedge compression fracture of T9-T10 vertebra, initial encounter for closed fracture: Secondary | ICD-10-CM | POA: Insufficient documentation

## 2023-10-21 DIAGNOSIS — M4125 Other idiopathic scoliosis, thoracolumbar region: Secondary | ICD-10-CM | POA: Diagnosis not present

## 2023-10-21 DIAGNOSIS — M4854XA Collapsed vertebra, not elsewhere classified, thoracic region, initial encounter for fracture: Secondary | ICD-10-CM | POA: Diagnosis not present

## 2023-10-21 DIAGNOSIS — K449 Diaphragmatic hernia without obstruction or gangrene: Secondary | ICD-10-CM | POA: Diagnosis not present

## 2023-10-21 DIAGNOSIS — W19XXXA Unspecified fall, initial encounter: Secondary | ICD-10-CM

## 2023-10-21 DIAGNOSIS — R29890 Loss of height: Secondary | ICD-10-CM | POA: Diagnosis not present

## 2023-10-21 DIAGNOSIS — M419 Scoliosis, unspecified: Secondary | ICD-10-CM | POA: Diagnosis not present

## 2023-10-31 ENCOUNTER — Other Ambulatory Visit: Payer: Self-pay | Admitting: Internal Medicine

## 2023-10-31 NOTE — Progress Notes (Unsigned)
 Telephone Visit- Progress Note: Referring Physician:  Sheron Dixons, MD [address] [place],  [place] [address]  Primary Physician:  Sheron Dixons, MD  This visit was performed via telephone.  Patient location: home Provider location: office  I spent a total of 10 minutes non-face-to-face activities for this visit on the date of this encounter including review of current clinical condition and response to treatment.    Patient has given verbal consent to this telephone visits and we reviewed the limitations of a telephone visit. Patient wishes to proceed.    Chief Complaint:  review imaging  History of Present Illness: Valerie Hanna is a 83 y.o. female has a history of HTN, GERD, CVA with left sided weakness, osteoporosis, scoliosis, dyslipidemia.    History of kyphoplasty T10-L4.   Last seen by me on 10/09/23 for increased mid back pain s/p fall on 09/20/23. Thoracic MRI was ordered and phone visit scheduled to review it.   She is feeling better. She still has constant mid back pain, but it is not as severe. No arm or leg pain. No weakness. Pain is worse with sitting (putting pressure on her back) and with walking.    She is on PLAVIX . Was given percocet from ED.    She quit smoking in 2017.    Conservative measures:  Physical therapy:  has not participated in PT Multimodal medical therapy including regular antiinflammatories:  tylenol , oxycodone  Injections:  no epidural steroid injections   Past Surgery:  Kyphoplasty (T10-L4).    Exam: No exam done as this was a telephone encounter.     Imaging: Thoracic MRI dated 10/21/23:  FINDINGS: Alignment: Mild scoliotic curvature. Increased kyphosis in the mid to lower thoracic region due to multiple fractures as described below.   Vertebrae: Old vertebral augmentations at T8, T10, T11, T12, L1, L2, L3 and L4. No complicating features seen relative to those levels. The those fractures are healed  without residual edema. Old healed minor compression deformities from C7 through T5. Moderate healed compression deformities at T6 and T7. Recent fracture at T9 with loss of height of 80% and posterior bowing of the posterior margin of the vertebral body by 3 mm. Marrow edema at this level indicates that this is a recent/un healed fracture. No sign of an underlying marrow lesion. This vertebral body showed some loss of height in 2017 but this has progressed since then.   Cord:  No cord compression or focal cord lesion.   Paraspinal and other soft tissues: No significant paravertebral finding relative to this presentation. Patient has a hiatal hernia containing primarily fat.   Disc levels:   No evidence of soft disc herniation. No compressive stenosis of the spinal canal.   IMPRESSION: 1. Recent compression fracture at T9 with loss of height of 80% and posterior bowing of the posterior margin of the vertebral body by 3 mm. Marrow edema at this level indicates that this is a recent/un healed fracture. No sign of an underlying marrow lesion. This vertebral body showed some loss of height in 2017 but this has progressed since then, with a definite acute component. 2. Old healed compression deformities from C7 through T5. Old healed compression deformities at T6 and T7. Old vertebral augmentations at T8, T10, T11, T12, L1, L2, L3 and L4. No complicating features seen relative to those levels. 3. No compressive stenosis of the spinal canal.     Electronically Signed   By: Junius Olive.D.  On: 10/29/2023 10:59   I have personally reviewed the images and agree with the above interpretation.  Assessment and Plan: [name] has a history of kyphoplasty T10-L4.    Had a fall on 09/16/23 with increased mid back pain.   She is feeling better since her last visit. She still has constant mid back pain, but it is not as severe. No arm or leg pain. No weakness.   She had previous  kyphoplasty at T8 and T10-L4. Also with known TL scoliosis.   She has new fracture at T9. She has old compression fractures C7-T5 and T6, T7. No cord compression.   Treatment options discussed with patient and following plan made:    - Discussed bracing. I do not think she would tolerate this due to her kyphosis/scoliosis. She will avoid bending, twisting, and lifting.  - Discussed kyphoplasty. Her pain is improving and she declines for now.  - No osteoporosis medications on her list- she states she takes something once a week. ?fosamax . Referral to endocrine for treatment of osteoporosis. No recent DEXA, but she has osteoporosis based on her multiple compression fractures.  - Follow up with me in 4 weeks for repeat thoracic xrays.   Lucetta Russel PA-C Neurosurgery

## 2023-11-01 ENCOUNTER — Ambulatory Visit: Admitting: Orthopedic Surgery

## 2023-11-01 ENCOUNTER — Encounter: Payer: Self-pay | Admitting: Orthopedic Surgery

## 2023-11-01 DIAGNOSIS — M4125 Other idiopathic scoliosis, thoracolumbar region: Secondary | ICD-10-CM

## 2023-11-01 DIAGNOSIS — S22070A Wedge compression fracture of T9-T10 vertebra, initial encounter for closed fracture: Secondary | ICD-10-CM | POA: Diagnosis not present

## 2023-11-01 DIAGNOSIS — Z9889 Other specified postprocedural states: Secondary | ICD-10-CM

## 2023-11-01 NOTE — Telephone Encounter (Signed)
 Requested by interface surscripts. Medication discontinued 10/09/23.  Requested Prescriptions  Refused Prescriptions Disp Refills   alendronate  (FOSAMAX ) 70 MG tablet [Pharmacy Med Name: ALENDRONATE  SODIUM 70 MG TAB] 12 tablet 3    Sig: TAKE 1 TABLET (70 MG TOTAL) BY MOUTH EVERY 7 DAYS WITH FULL GLASS WATER  ON EMPTY STOMACH     Endocrinology:  Bisphosphonates Failed - 11/01/2023  3:55 PM      Failed - Vitamin D  in normal range and within 360 days    Vit D, 25-Hydroxy  Date Value Ref Range Status  08/08/2022 69.94 30 - 100 ng/mL Final    Comment:    (NOTE) Vitamin D  deficiency has been defined by the Institute of Medicine  and an Endocrine Society practice guideline as a level of serum 25-OH  vitamin D  less than 20 ng/mL (1,2). The Endocrine Society went on to  further define vitamin D  insufficiency as a level between 21 and 29  ng/mL (2).  1. IOM (Institute of Medicine). 2010. Dietary reference intakes for  calcium  and D. [place]: [employer]. 2. Holick MF, Binkley [place], Bischoff-Ferrari HA, et al. Evaluation,  treatment, and prevention of vitamin D  deficiency: an Endocrine  Society clinical practice guideline, JCEM. 2011 Jul; 96(7): 1911-30.  Performed at [facility], [address] [place], [place] [address]          Failed - Cr in normal range and within 360 days    Creatinine  Date Value Ref Range Status  02/23/2013 0.45 (L) 0.60 - 1.30 mg/dL Final   Creatinine, Ser  Date Value Ref Range Status  09/20/2023 0.32 (L) 0.44 - 1.00 mg/dL Final         Failed - Mg Level in normal range and within 360 days    Magnesium   Date Value Ref Range Status  02/03/2013 1.6 (L) mg/dL Final    Comment:    5.2-8.4 THERAPEUTIC RANGE: 4-7 mg/dL TOXIC: > 10 mg/dL  -----------------------          Failed - Phosphate in normal range and within 360 days    No results found for: "PHOS"       Passed - Ca in normal range and within 360 days    Calcium   Date  Value Ref Range Status  09/20/2023 9.1 8.9 - 10.3 mg/dL Final   Calcium , Total  Date Value Ref Range Status  02/23/2013 8.9 8.5 - 10.1 mg/dL Final         Passed - eGFR is 30 or above and within 360 days    EGFR (African American)  Date Value Ref Range Status  02/23/2013 >60  Final   GFR calc Af Amer  Date Value Ref Range Status  07/29/2019 >60 >60 mL/min Final   EGFR (Non-African Amer.)  Date Value Ref Range Status  02/23/2013 >60  Final    Comment:    eGFR values <34mL/min/1.73 m2 may be an indication of chronic kidney disease (CKD). Calculated eGFR is useful in patients with stable renal function. The eGFR calculation will not be reliable in acutely ill patients when serum creatinine is changing rapidly. It is not useful in  patients on dialysis. The eGFR calculation may not be applicable to patients at the low and high extremes of body sizes, pregnant women, and vegetarians.    GFR, Estimated  Date Value Ref Range Status  09/20/2023 >60 >60 mL/min Final    Comment:    (NOTE) Calculated using the CKD-EPI Creatinine Equation (  2021)    eGFR  Date Value Ref Range Status  08/15/2023 95 >59 mL/min/1.73 Final         Passed - Valid encounter within last 12 months    Recent Outpatient Visits           2 months ago Annual physical exam   [facility] Primary Care & Sports Medicine at [facility], Chales [place], MD       Future Appointments             In 2 months Gala Jubilee [place], MD [facility] Primary Care & Sports Medicine at [facility], [facility]            Passed - Bone Mineral Density or Dexa Scan completed in the last 2 years

## 2023-11-03 ENCOUNTER — Other Ambulatory Visit: Payer: Self-pay | Admitting: Internal Medicine

## 2023-11-03 DIAGNOSIS — F411 Generalized anxiety disorder: Secondary | ICD-10-CM

## 2023-11-03 DIAGNOSIS — F324 Major depressive disorder, single episode, in partial remission: Secondary | ICD-10-CM

## 2023-11-05 NOTE — Telephone Encounter (Signed)
 Requested medication (s) are due for refill today: yes  Requested medication (s) are on the active medication list: yes  Last refill:  06/17/23 #60 3 RF  Future visit scheduled: yes  Notes to clinic:  med not delegated to NT to RF   Requested Prescriptions  Pending Prescriptions Disp Refills   ALPRAZolam  (XANAX ) 0.5 MG tablet [Pharmacy Med Name: ALPRAZOLAM  0.5 MG TABLET] 60 tablet     Sig: TAKE 1 TABLET BY MOUTH 2 TIMES DAILY AS NEEDED.     Not Delegated - Psychiatry: Anxiolytics/Hypnotics 2 Failed - 11/05/2023  9:10 AM      Failed - This refill cannot be delegated      Failed - Urine Drug Screen completed in last 360 days      Passed - Patient is not pregnant      Passed - Valid encounter within last 6 months    Recent Outpatient Visits           2 months ago Annual physical exam   [place] [facility] at [facility], Chales [place], MD       Future Appointments             In 2 months Sheron Dixons, MD [facility] & [facility] Medicine at [facility], [facility]            Refused Prescriptions Disp Refills   alendronate  (FOSAMAX ) 70 MG tablet [Pharmacy Med Name: ALENDRONATE  SODIUM 70 MG TAB] 12 tablet 3    Sig: TAKE 1 TABLET (70 MG TOTAL) BY MOUTH EVERY 7 DAYS WITH FULL GLASS WATER  ON EMPTY STOMACH     Endocrinology:  Bisphosphonates Failed - 11/05/2023  9:10 AM      Failed - Vitamin D  in normal range and within 360 days    Vit D, 25-Hydroxy  Date Value Ref Range Status  08/08/2022 69.94 30 - 100 ng/mL Final    Comment:    (NOTE) Vitamin D  deficiency has been defined by the Institute of Medicine  and an Endocrine Society practice guideline as a level of serum 25-OH  vitamin D  less than 20 ng/mL (1,2). The Endocrine Society went on to  further define vitamin D  insufficiency as a level between 21 and 29  ng/mL (2).  1. IOM (Institute of Medicine). 2010. Dietary reference intakes for  calcium  and D. [place]: The  [employer]. 2. Holick MF, Binkley [place], Bischoff-Ferrari HA, et al. Evaluation,  treatment, and prevention of vitamin D  deficiency: an Endocrine  Society clinical practice guideline, JCEM. 2011 Jul; 96(7): 1911-30.  Performed at [facility], [address] [place], [place] [address]          Failed - Cr in normal range and within 360 days    Creatinine  Date Value Ref Range Status  02/23/2013 0.45 (L) 0.60 - 1.30 mg/dL Final   Creatinine, Ser  Date Value Ref Range Status  09/20/2023 0.32 (L) 0.44 - 1.00 mg/dL Final         Failed - Mg Level in normal range and within 360 days    Magnesium   Date Value Ref Range Status  02/03/2013 1.6 (L) mg/dL Final    Comment:    6.0-4.5 THERAPEUTIC RANGE: 4-7 mg/dL TOXIC: > 10 mg/dL  -----------------------          Failed - Phosphate in normal range and within 360 days    No results found for: "PHOS"  Passed - Ca in normal range and within 360 days    Calcium   Date Value Ref Range Status  09/20/2023 9.1 8.9 - 10.3 mg/dL Final   Calcium , Total  Date Value Ref Range Status  02/23/2013 8.9 8.5 - 10.1 mg/dL Final         Passed - eGFR is 30 or above and within 360 days    EGFR (African American)  Date Value Ref Range Status  02/23/2013 >60  Final   GFR calc Af Amer  Date Value Ref Range Status  07/29/2019 >60 >60 mL/min Final   EGFR (Non-African Amer.)  Date Value Ref Range Status  02/23/2013 >60  Final    Comment:    eGFR values <24mL/min/1.73 m2 may be an indication of chronic kidney disease (CKD). Calculated eGFR is useful in patients with stable renal function. The eGFR calculation will not be reliable in acutely ill patients when serum creatinine is changing rapidly. It is not useful in  patients on dialysis. The eGFR calculation may not be applicable to patients at the low and high extremes of body sizes, pregnant women, and vegetarians.    GFR, Estimated  Date Value Ref Range Status   09/20/2023 >60 >60 mL/min Final    Comment:    (NOTE) Calculated using the CKD-EPI Creatinine Equation (2021)    eGFR  Date Value Ref Range Status  08/15/2023 95 >59 mL/min/1.73 Final         Passed - Valid encounter within last 12 months    Recent Outpatient Visits           2 months ago Annual physical exam   [place] [facility] at [facility], Chales [place], MD       Future Appointments             In 2 months Gala Jubilee [place], MD [facility] & [facility] Medicine at [facility], [facility]            Passed - Bone Mineral Density or Dexa Scan completed in the last 2 years

## 2023-11-05 NOTE — Telephone Encounter (Signed)
 Please review.  KP

## 2023-11-05 NOTE — Telephone Encounter (Signed)
 Requested Prescriptions  Pending Prescriptions Disp Refills   ALPRAZolam  (XANAX ) 0.5 MG tablet [Pharmacy Med Name: ALPRAZOLAM  0.5 MG TABLET] 60 tablet     Sig: TAKE 1 TABLET BY MOUTH 2 TIMES DAILY AS NEEDED.     Not Delegated - Psychiatry: Anxiolytics/Hypnotics 2 Failed - 11/05/2023  9:09 AM      Failed - This refill cannot be delegated      Failed - Urine Drug Screen completed in last 360 days      Passed - Patient is not pregnant      Passed - Valid encounter within last 6 months    Recent Outpatient Visits           2 months ago Annual physical exam   Valerie Hanna, Valerie [place], Valerie Hanna       Future Appointments             In 2 months Valerie Dixons, Valerie Hanna [facility] Medicine at [facility], [facility]             alendronate  (FOSAMAX ) 70 MG tablet [Pharmacy Med Name: ALENDRONATE  SODIUM 70 MG TAB] 12 tablet 3    Sig: TAKE 1 TABLET (70 MG TOTAL) BY MOUTH EVERY 7 DAYS WITH FULL GLASS WATER  ON EMPTY STOMACH     Endocrinology:  Bisphosphonates Failed - 11/05/2023  9:09 AM      Failed - Vitamin D  in normal range and within 360 days    Vit D, 25-Hydroxy  Date Value Ref Range Status  08/08/2022 69.94 30 - 100 ng/mL Final    Comment:    (NOTE) Vitamin D  deficiency has been defined by the Institute of Medicine  and an Endocrine Society practice guideline as a level of serum 25-OH  vitamin D  less than 20 ng/mL (1,2). The Endocrine Society went on to  further define vitamin D  insufficiency as a level between 21 and 29  ng/mL (2).  1. IOM (Institute of Medicine). 2010. Dietary reference intakes for  calcium  and D. [place]: [employer]. 2. Holick MF, Binkley , Bischoff-Ferrari HA, et al. Evaluation,  treatment, and prevention of vitamin D  deficiency: an Endocrine  Society clinical practice guideline, JCEM. 2011 Jul; 96(7): 1911-30.  Performed at [facility], [address] [place], [place] [address]          Failed - Cr in normal range and within 360 days    Creatinine  Date Value Ref Range Status  02/23/2013 0.45 (L) 0.60 - 1.30 mg/dL Final   Creatinine, Ser  Date Value Ref Range Status  09/20/2023 0.32 (L) 0.44 - 1.00 mg/dL Final         Failed - Mg Level in normal range and within 360 days    Magnesium   Date Value Ref Range Status  02/03/2013 1.6 (L) mg/dL Final    Comment:    6.0-4.5 THERAPEUTIC RANGE: 4-7 mg/dL TOXIC: > 10 mg/dL  -----------------------          Failed - Phosphate in normal range and within 360 days    No results found for: "PHOS"       Passed - Ca in normal range and within 360 days    Calcium   Date Value Ref Range Status  09/20/2023 9.1 8.9 - 10.3 mg/dL Final   Calcium , Total  Date Value Ref Range Status  02/23/2013 8.9 8.5 - 10.1 mg/dL Final  Passed - eGFR is 30 or above and within 360 days    EGFR (African American)  Date Value Ref Range Status  02/23/2013 >60  Final   GFR calc Af Amer  Date Value Ref Range Status  07/29/2019 >60 >60 mL/min Final   EGFR (Non-African Amer.)  Date Value Ref Range Status  02/23/2013 >60  Final    Comment:    eGFR values <57mL/min/1.73 m2 may be an indication of chronic kidney disease (CKD). Calculated eGFR is useful in patients with stable renal function. The eGFR calculation will not be reliable in acutely ill patients when serum creatinine is changing rapidly. It is not useful in  patients on dialysis. The eGFR calculation may not be applicable to patients at the low and high extremes of body sizes, pregnant women, and vegetarians.    GFR, Estimated  Date Value Ref Range Status  09/20/2023 >60 >60 mL/min Final    Comment:    (NOTE) Calculated using the CKD-EPI Creatinine Equation (2021)    eGFR  Date Value Ref Range Status  08/15/2023 95 >59 mL/min/1.73 Final         Passed - Valid encounter within last 12 months    Recent Outpatient Visits            2 months ago Annual physical exam   Valerie Hanna, Valerie [place], Valerie Hanna       Future Appointments             In 2 months Valerie Jubilee Valerie [place], Valerie Hanna [facility] Care & [facility] Medicine at [facility], [facility]            Passed - Bone Mineral Density or Dexa Scan completed in the last 2 years

## 2023-11-11 ENCOUNTER — Other Ambulatory Visit: Payer: Self-pay | Admitting: Internal Medicine

## 2023-11-11 ENCOUNTER — Telehealth: Payer: Self-pay | Admitting: Internal Medicine

## 2023-11-11 DIAGNOSIS — M8000XD Age-related osteoporosis with current pathological fracture, unspecified site, subsequent encounter for fracture with routine healing: Secondary | ICD-10-CM

## 2023-11-11 MED ORDER — ALENDRONATE SODIUM 70 MG PO TABS: 70.0000 mg | ORAL_TABLET | ORAL | 11 refills | Status: AC

## 2023-11-11 NOTE — Telephone Encounter (Signed)
 Reviewed chart, medication was discontinued last month. Called pt and verified she is still taking this, pt would like a refill on this medication

## 2023-11-11 NOTE — Telephone Encounter (Unsigned)
 Copied from CRM [id number]. Topic: Clinical - Medication Refill >> Nov 11, 2023 10:27 AM Rosamond Comes wrote: Medication: Alendronic 70mg   this is not on medication list  Has the patient contacted their pharmacy? Yes Pharmacy stated they would fax the provider about reflill This is the patient's preferred pharmacy:  [id number] - [place], [place] - [address] [place] [address] Phone: [phone] Fax: [phone]  Is this the correct pharmacy for this prescription? Yes If no, delete pharmacy and type the correct one.   Has the prescription been filled recently? Yes  Is the patient out of the medication? Yes  Has the patient been seen for an appointment in the last year OR does the patient have an upcoming appointment? Yes  Can we respond through MyChart? No  Agent: Please be advised that Rx refills may take up to 3 business days. We ask that you follow-up with your pharmacy.

## 2023-11-15 NOTE — Progress Notes (Deleted)
 Referring Physician:  Sheron Dixons, MD [address] [place],  [place] [address]  Primary Physician:  Sheron Dixons, MD  History of Present Illness: 11/15/2023 [name] has a history of HTN, GERD, CVA with left sided weakness, osteoporosis, scoliosis, dyslipidemia.   History of kyphoplasty T10-L4.   She had phone visit with me on 11/01/23 to review her thoracic MRI. She has known new fracture at T9. She has old compression fractures C7-T5 and T6, T7. No cord compression.   No brace given as I did not think she would tolerate it due kyphosis/scoliosis. Her pain was improved.   She is here for repeat xrays.       Last seen by me on 10/09/23 for increased mid back pain s/p fall on 09/20/23. Thoracic MRI was ordered and phone visit scheduled to review it.    She is feeling better. She still has constant mid back pain, but it is not as severe. No arm or leg pain. No weakness. Pain is worse with sitting (putting pressure on her back) and with walking.    She is on PLAVIX . Was given percocet from ED.    She quit smoking in 2017.    Conservative measures:  Physical therapy:  has not participated in PT Multimodal medical therapy including regular antiinflammatories:  tylenol , oxycodone  Injections:  no epidural steroid injections   Past Surgery:  Kyphoplasty (T10-L4).   The symptoms are causing a significant impact on the patient's life.   Review of Systems:  A 10 point review of systems is negative, except for the pertinent positives and negatives detailed in the HPI.  Past Medical History: Past Medical History:  Diagnosis Date   Allergy    Anemia    Anxiety    Arthritis    Cancer (HCC)    melanoma  ON HER BACK MANY YRS AGO   Constipation    Dehydration with hyponatremia 05/26/2015   Depression    GERD (gastroesophageal reflux disease)    H/O hiatal hernia    Headache(784.0)    High cholesterol    History of kidney infection    Hypertension     no longer taking meds since stroke   Keratosis, inflamed seborrheic 01/18/2017   Osteoporosis    Pleurisy    being treated for this now 01/25/14 (left side)   Stroke (HCC)    2014, WITH LEFT SIDED WEAKNESS    Past Surgical History: Past Surgical History:  Procedure Laterality Date   ABDOMINAL HYSTERECTOMY     APPENDECTOMY     BACK SURGERY     kyphoplasty   bladder tack     CATARACT EXTRACTION W/PHACO Left 06/29/2021   Procedure: CATARACT EXTRACTION PHACO AND INTRAOCULAR LENS PLACEMENT (IOC) LEFT 11.45 01:25.4;  Surgeon: Annell Kidney, MD;  Location: [facility] ORS;  Service: Ophthalmology;  Laterality: Left;   CHOLECYSTECTOMY     COLONOSCOPY     DILATION AND CURETTAGE OF UTERUS     EYE SURGERY Right    cataract removed   EYE SURGERY Right    bubble put in for a hole in retina   FOOT SURGERY Bilateral    KYPHOPLASTY N/A 01/28/2014   Procedure: LUMBAR FOUR KYPHOPLASTY;  Surgeon: Audie Bleacher, MD;  Location: [facility];  Service: Neurosurgery;  Laterality: N/A;  L4 Kyphoplasty   KYPHOPLASTY N/A 04/18/2016   Procedure: KYPHOPLASTY Thoracic eight-Thoracic ten;  Surgeon: Audie Bleacher, MD;  Location: [facility] OR;  Service: Neurosurgery;  Laterality: N/A;  KYPHOPLASTY  T8-T10   TOTAL HIP ARTHROPLASTY Right 05/26/2015   Procedure: RIGHT BIPOLAR VS TOTAL HIP ANTERIOR APPROACH;  Surgeon: Arnie Lao, MD;  Location: [facility] OR;  Service: Orthopedics;  Laterality: Right;    Allergies: Allergies as of 11/20/2023 - Review Complete 11/01/2023  Allergen Reaction Noted   Cefuroxime  axetil Nausea And Vomiting 07/22/2018   Aspirin Other (See Comments) 08/12/2013    Medications: Outpatient Encounter Medications as of 11/20/2023  Medication Sig   Acetaminophen  (ARTHRITIS PAIN PO) Take 2 tablets by mouth daily as needed (Arthritis).   acetaminophen  (TYLENOL ) 500 MG tablet Take 1,000 mg by mouth every 6 (six) hours as needed for moderate pain or mild pain.   albuterol  (VENTOLIN  HFA) 108  (90 Base) MCG/ACT inhaler TAKE 2 PUFFS BY MOUTH EVERY 6 HOURS AS NEEDED FOR WHEEZE OR SHORTNESS OF BREATH   alendronate  (FOSAMAX ) 70 MG tablet Take 1 tablet (70 mg total) by mouth every 7 (seven) days. Take with a full glass of water  on an empty stomach.   ALPRAZolam  (XANAX ) 0.5 MG tablet TAKE 1 TABLET BY MOUTH 2 TIMES DAILY AS NEEDED.   CALCIUM  PO Take 1 tablet by mouth daily.   Cholecalciferol (VITAMIN D3 PO) Take 1 tablet by mouth daily.   clopidogrel  (PLAVIX ) 75 MG tablet TAKE 1 TABLET BY MOUTH EVERY DAY   fexofenadine (ALLEGRA) 60 MG tablet Take 60 mg by mouth daily.   lisinopril  (ZESTRIL ) 10 MG tablet Take 1 tablet (10 mg total) by mouth daily.   mirtazapine  (REMERON ) 30 MG tablet TAKE 1 TABLET BY MOUTH EVERYDAY AT BEDTIME   Multiple Vitamin (MULTIVITAMIN WITH MINERALS) TABS tablet Take 1 tablet by mouth daily.   oxyCODONE  (ROXICODONE ) 5 MG immediate release tablet Take 0.5-1 tablets (2.5-5 mg total) by mouth every 8 (eight) hours as needed for severe pain (pain score 7-10).   pantoprazole  (PROTONIX ) 40 MG tablet Take 1 tablet (40 mg total) by mouth 2 (two) times daily.   sertraline  (ZOLOFT ) 100 MG tablet Take 1 tablet (100 mg total) by mouth daily.   simvastatin  (ZOCOR ) 40 MG tablet Take 1 tablet (40 mg total) by mouth daily at 6 PM.   traZODone  (DESYREL ) 50 MG tablet Take 1 tablet (50 mg total) by mouth at bedtime as needed for sleep.   triamcinolone  cream (KENALOG ) 0.1 %    No facility-administered encounter medications on file as of 11/20/2023.    Social History: Social History   Tobacco Use   Smoking status: Former    Current packs/day: 0.00    Average packs/day: 0.5 packs/day for 30.0 years (15.0 ttl pk-yrs)    Types: Cigarettes    Start date: 04/15/1986    Quit date: 04/15/2016    Years since quitting: 7.5   Smokeless tobacco: Never   Tobacco comments:    smoking cessation materials not required  Vaping Use   Vaping status: Never Used  Substance Use Topics   Alcohol  use: Yes    Alcohol/week: 14.0 - 21.0 standard drinks of alcohol    Types: 14 - 21 Glasses of wine per week   Drug use: No    Family Medical History: Family History  Problem Relation Age of Onset   Healthy Sister    Healthy Sister     Physical Examination: There were no vitals filed for this visit.    Awake, alert, oriented to person, place, and time.  Speech is clear and fluent. Fund of knowledge is appropriate.   Cranial Nerves: Pupils equal round and reactive  to light.  Facial tone is symmetric.    She has mid to lower thoracic tenderness. No lumbar tenderness is noted.    No abnormal lesions on exposed skin.   Strength: Side Biceps Triceps Deltoid Interossei Grip Wrist Ext. Wrist Flex.  R 5 5 5 5 5 5 5   L 5 5 5 5 5 5 5    Side Iliopsoas Quads Hamstring PF DF EHL  R 5 5 5 5 5 5   L 5 5 5 5 5 5    Reflexes are 2+ and symmetric at the biceps, brachioradialis, patella and achilles.   Hoffman's is absent.  Clonus is not present.   Bilateral upper and lower extremity sensation is intact to light touch.     She ambulates slowly with rollator. She has known scoliosis.   Medical Decision Making  Imaging: Thoracic xrays dated ***:  ***  Report for above xrays not yet available.   Assessment and Plan: Ms. Hegstrom has a history of kyphoplasty T10-L4.    Had a fall on 09/16/23 with increased mid back pain.    She is feeling better since her last visit. She still has constant mid back pain, but it is not as severe. No arm or leg pain. No weakness.    She had previous kyphoplasty at T8 and T10-L4. Also with known TL scoliosis.    She has new fracture at T9. She has old compression fractures C7-T5 and T6, T7. No cord compression.   Treatment options discussed with patient and following plan made:    - Discussed bracing. I do not think she would tolerate this due to her kyphosis/scoliosis. She will avoid bending, twisting, and lifting.  - Discussed kyphoplasty. Her pain is  improving and she declines for now.  - No osteoporosis medications on her list- she states she takes something once a week. ?fosamax . Referral to endocrine for treatment of osteoporosis. No recent DEXA, but she has osteoporosis based on her multiple compression fractures.  - Follow up with me in 4 weeks for repeat thoracic xrays.    I spent a total of *** minutes in face-to-face and non-face-to-face activities related to this patient's care today including review of outside records, review of imaging, review of symptoms, physical exam, discussion of differential diagnosis, discussion of treatment options, and documentation.   Lucetta Russel PA-C Dept. of Neurosurgery

## 2023-11-20 ENCOUNTER — Ambulatory Visit: Admitting: Orthopedic Surgery

## 2023-11-26 ENCOUNTER — Other Ambulatory Visit: Payer: Self-pay | Admitting: Internal Medicine

## 2023-11-26 DIAGNOSIS — K2100 Gastro-esophageal reflux disease with esophagitis, without bleeding: Secondary | ICD-10-CM

## 2023-11-27 ENCOUNTER — Other Ambulatory Visit: Payer: Self-pay | Admitting: Internal Medicine

## 2023-11-27 DIAGNOSIS — R0602 Shortness of breath: Secondary | ICD-10-CM

## 2023-11-27 NOTE — Telephone Encounter (Signed)
 Requested Prescriptions  Pending Prescriptions Disp Refills   pantoprazole  (PROTONIX ) 40 MG tablet [Pharmacy Med Name: PANTOPRAZOLE  SOD DR 40 MG TAB] 180 tablet 0    Sig: TAKE 1 TABLET BY MOUTH TWICE A DAY     Gastroenterology: Proton Pump Inhibitors Passed - 11/27/2023  1:29 PM      Passed - Valid encounter within last 12 months    Recent Outpatient Visits           3 months ago Annual physical exam   [facility] at [facility], Leita DEL, MD       Future Appointments             In 2 months Justus, Leita DEL, MD [facility] Primary Care & Sports Medicine at [facility], [facility]

## 2023-11-28 NOTE — Telephone Encounter (Signed)
 Requested Prescriptions  Pending Prescriptions Disp Refills   albuterol  (VENTOLIN  HFA) 108 (90 Base) MCG/ACT inhaler [Pharmacy Med Name: ALBUTEROL  HFA (PROAIR ) INHALER] 8.5 each 2    Sig: TAKE 2 PUFFS BY MOUTH EVERY 6 HOURS AS NEEDED FOR WHEEZE OR SHORTNESS OF BREATH     Pulmonology:  Beta Agonists 2 Passed - 11/28/2023  1:44 PM      Passed - Last BP in normal range    BP Readings from Last 1 Encounters:  10/09/23 126/74         Passed - Last Heart Rate in normal range    Pulse Readings from Last 1 Encounters:  09/20/23 87         Passed - Valid encounter within last 12 months    Recent Outpatient Visits           3 months ago Annual physical exam   [facility] Primary Care & Sports Medicine at [facility], Leita DEL, MD       Future Appointments             In 2 months Justus, Leita DEL, MD [facility] Health Primary Care & Sports Medicine at [facility], [facility]

## 2023-12-12 ENCOUNTER — Other Ambulatory Visit: Payer: Self-pay | Admitting: Internal Medicine

## 2023-12-12 DIAGNOSIS — E785 Hyperlipidemia, unspecified: Secondary | ICD-10-CM

## 2023-12-13 NOTE — Telephone Encounter (Signed)
 Requested Prescriptions  Pending Prescriptions Disp Refills   simvastatin  (ZOCOR ) 40 MG tablet [Pharmacy Med Name: SIMVASTATIN  40 MG TABLET] 90 tablet 1    Sig: TAKE 1 TABLET BY MOUTH DAILY AT 6 PM.     Cardiovascular:  Antilipid - Statins Failed - 12/13/2023  2:31 PM      Failed - Lipid Panel in normal range within the last 12 months    Cholesterol, Total  Date Value Ref Range Status  08/15/2023 196 100 - 199 mg/dL Final   Cholesterol  Date Value Ref Range Status  02/01/2013 168 0 - 200 mg/dL Final   Ldl Cholesterol, Calc  Date Value Ref Range Status  02/01/2013 87 0 - 100 mg/dL Final   LDL Chol Calc (NIH)  Date Value Ref Range Status  08/15/2023 91 0 - 99 mg/dL Final   HDL Cholesterol  Date Value Ref Range Status  02/01/2013 67 (H) 40 - 60 mg/dL Final   HDL  Date Value Ref Range Status  08/15/2023 84 >39 mg/dL Final   Triglycerides  Date Value Ref Range Status  08/15/2023 121 0 - 149 mg/dL Final  91/68/7985 70 0 - 200 mg/dL Final         Passed - Patient is not pregnant      Passed - Valid encounter within last 12 months    Recent Outpatient Visits           4 months ago Annual physical exam   [place] [facility] at [facility], Leita DEL, MD       Future Appointments             In 1 month Justus, Leita DEL, MD [facility] Health Primary Care & Sports Medicine at [facility], [facility]

## 2023-12-17 ENCOUNTER — Ambulatory Visit: Payer: Self-pay

## 2023-12-17 NOTE — Telephone Encounter (Signed)
 Tried to call patient. Left VM to call back.

## 2023-12-17 NOTE — Telephone Encounter (Signed)
 2nd attempt---Left a voicemail for patient to call back             Diarrhea every other day for about a month and a half, states she's not able to make it to the bathroom.

## 2023-12-17 NOTE — Telephone Encounter (Signed)
 Called pt - left message on machine to return our call.      Diarrhea every other day for about a month and a half, states she's not able to make it to the bathroom.

## 2023-12-17 NOTE — Telephone Encounter (Signed)
 3rd attempt at calling patient--No answer--Left a voicemail--Please reach out to patient Reason for Disposition . Third attempt to contact caller AND no contact made. Phone number verified.  Protocols used: No Contact or Duplicate Contact Call-A-AH

## 2023-12-26 ENCOUNTER — Other Ambulatory Visit: Payer: Self-pay | Admitting: Internal Medicine

## 2023-12-26 DIAGNOSIS — F324 Major depressive disorder, single episode, in partial remission: Secondary | ICD-10-CM

## 2023-12-27 NOTE — Telephone Encounter (Signed)
 Requested Prescriptions  Pending Prescriptions Disp Refills   traZODone  (DESYREL ) 50 MG tablet [Pharmacy Med Name: TRAZODONE  50 MG TABLET] 90 tablet 0    Sig: TAKE 1 TABLET BY MOUTH AT BEDTIME AS NEEDED FOR SLEEP.     Psychiatry: Antidepressants - Serotonin Modulator Passed - 12/27/2023 11:40 AM      Passed - Completed PHQ-2 or PHQ-9 in the last 360 days      Passed - Valid encounter within last 6 months    Recent Outpatient Visits           4 months ago Annual physical exam   [facility] Primary Care & Sports Medicine at [facility], Leita DEL, MD       Future Appointments             In 1 month Justus, Leita DEL, MD [facility] Health Primary Care & Sports Medicine at [facility], [facility]

## 2024-01-03 ENCOUNTER — Other Ambulatory Visit: Payer: Self-pay | Admitting: Internal Medicine

## 2024-01-03 DIAGNOSIS — F324 Major depressive disorder, single episode, in partial remission: Secondary | ICD-10-CM

## 2024-01-03 NOTE — Telephone Encounter (Signed)
 Requested Prescriptions  Pending Prescriptions Disp Refills   mirtazapine  (REMERON ) 30 MG tablet [Pharmacy Med Name: MIRTAZAPINE  30 MG TABLET] 90 tablet 0    Sig: TAKE 1 TABLET BY MOUTH EVERYDAY AT BEDTIME     Psychiatry: Antidepressants - mirtazapine  Passed - 01/03/2024  2:23 PM      Passed - Completed PHQ-2 or PHQ-9 in the last 360 days      Passed - Valid encounter within last 6 months    Recent Outpatient Visits           4 months ago Annual physical exam   [facility] at [facility], Leita DEL, MD       Future Appointments             In 3 weeks Justus Leita DEL, MD [facility] at [facility], [facility]

## 2024-01-29 ENCOUNTER — Ambulatory Visit: Admitting: Internal Medicine

## 2024-01-29 NOTE — Assessment & Plan Note (Deleted)
 Blood pressure is well controlled on lisinopril . Recent labs normal. No medication side effects noted. Plan to continue current medications.

## 2024-01-29 NOTE — Assessment & Plan Note (Deleted)
 Clinically stable on Remeron , Sertraline  and Trazodone .   No SI or HI on evaluation. Plan to continue same medications for now.
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
## 2024-02-01 ENCOUNTER — Other Ambulatory Visit: Payer: Self-pay | Admitting: Internal Medicine

## 2024-02-01 DIAGNOSIS — F324 Major depressive disorder, single episode, in partial remission: Secondary | ICD-10-CM

## 2024-02-01 DIAGNOSIS — F411 Generalized anxiety disorder: Secondary | ICD-10-CM

## 2024-02-04 NOTE — Telephone Encounter (Signed)
 Please review.  KP

## 2024-02-04 NOTE — Telephone Encounter (Signed)
 Requested medication (s) are due for refill today: yes  Requested medication (s) are on the active medication list: yes  Last refill:  11/05/23 #60 2 RF  Future visit scheduled: yes  Notes to clinic:  med not delegated to NT to RF   Requested Prescriptions  Pending Prescriptions Disp Refills   ALPRAZolam  (XANAX ) 0.5 MG tablet [Pharmacy Med Name: ALPRAZOLAM  0.5 MG TABLET] 60 tablet     Sig: TAKE 1 TABLET BY MOUTH TWICE A DAY AS NEEDED     Not Delegated - Psychiatry: Anxiolytics/Hypnotics 2 Failed - 02/04/2024 10:24 AM      Failed - This refill cannot be delegated      Failed - Urine Drug Screen completed in last 360 days      Passed - Patient is not pregnant      Passed - Valid encounter within last 6 months    Recent Outpatient Visits           5 months ago Annual physical exam   [facility] at [facility], Leita DEL, MD       Future Appointments             In 2 weeks Justus, Leita DEL, MD [facility] Medicine at [facility], [phone] [place]

## 2024-02-12 ENCOUNTER — Ambulatory Visit: Admitting: Podiatry

## 2024-02-12 DIAGNOSIS — L97511 Non-pressure chronic ulcer of other part of right foot limited to breakdown of skin: Secondary | ICD-10-CM | POA: Diagnosis not present

## 2024-02-12 DIAGNOSIS — L02611 Cutaneous abscess of right foot: Secondary | ICD-10-CM | POA: Diagnosis not present

## 2024-02-12 DIAGNOSIS — L03031 Cellulitis of right toe: Secondary | ICD-10-CM

## 2024-02-12 MED ORDER — GENTAMICIN SULFATE 0.1 % EX OINT: 1.0000 | TOPICAL_OINTMENT | Freq: Every day | CUTANEOUS | 0 refills | Status: AC

## 2024-02-12 MED ORDER — AMOXICILLIN-POT CLAVULANATE 875-125 MG PO TABS: 1.0000 | ORAL_TABLET | Freq: Two times a day (BID) | ORAL | 0 refills | Status: AC

## 2024-02-13 ENCOUNTER — Ambulatory Visit: Admitting: Podiatry

## 2024-02-13 NOTE — Progress Notes (Signed)
  Subjective:  Patient ID: Valerie Hanna, female    DOB: Oct 14, 1940,  MRN: [id number]  Chief Complaint  Patient presents with   Toe Pain    Right foot 2nd toe. Per patient no pain today. It looks wet and crusty    83 y.o. female presents with the above complaint. History confirmed with patient.  She has not been back for some time and has been red and swollen  Objective:  Physical Exam: Right foot is warm and is relatively well-perfused feet there is a full-thickness ulceration on the right second toe with erythema proceeding to the MTP joint    Assessment:   1. Ulcer of right second toe, limited to breakdown of skin (HCC)   2. Cellulitis and abscess of toe of right foot      Plan:  Patient was evaluated and treated and all questions answered.  Has new cellulitis secondary to partial-thickness skin ulcer on the right second toe.  Gentamicin  ointment prescribed and placed her on Augmentin  to take twice daily.  Return in 3 weeks to reevaluate.  Dress the wound daily after washing with Dial soap and water .  Return in about 3 weeks (around 03/04/2024) for wound care.

## 2024-02-19 ENCOUNTER — Ambulatory Visit: Admitting: Emergency Medicine

## 2024-02-19 VITALS — Ht 62.0 in | Wt 115.0 lb

## 2024-02-19 DIAGNOSIS — Z0000 Encounter for general adult medical examination without abnormal findings: Secondary | ICD-10-CM

## 2024-02-19 NOTE — Patient Instructions (Signed)
 Valerie Hanna,  Thank you for taking the time for your Medicare Wellness Visit. I appreciate your continued commitment to your health goals. Please review the care plan we discussed, and feel free to reach out if I can assist you further.  Medicare recommends these wellness visits once per year to help you and your care team stay ahead of potential health issues. These visits are designed to focus on prevention, allowing your provider to concentrate on managing your acute and chronic conditions during your regular appointments.  Please note that Annual Wellness Visits do not include a physical exam. Some assessments may be limited, especially if the visit was conducted virtually. If needed, we may recommend a separate in-person follow-up with your provider.  Ongoing Care Seeing your primary care provider every 3 to 6 months helps us  monitor your health and provide consistent, personalized care.   Referrals If a referral was made during today's visit and you haven't received any updates within two weeks, please contact the referred provider directly to check on the status.  Recommended Screenings: None  Health Maintenance  Topic Date Due   Zoster (Shingles) Vaccine (1 of 2) Never done   DTaP/Tdap/Td vaccine (1 - Tdap) 08/14/2024*   Pneumococcal Vaccine for age over 61 (1 of 1 - PCV) 08/14/2024*   Flu Shot  09/01/2024*   Medicare Annual Wellness Visit  02/18/2025   HPV Vaccine  Aged Out   Meningitis B Vaccine  Aged Out   Colon Cancer Screening  Discontinued   COVID-19 Vaccine  Discontinued   Hepatitis C Screening  Discontinued  *Topic was postponed. The date shown is not the original due date.       02/19/2024   11:38 AM  Advanced Directives  Does Patient Have a Medical Advance Directive? No  Would patient like information on creating a medical advance directive? No - Patient declined   Advance Care Planning is important because it: Ensures you receive medical care that aligns with  your values, goals, and preferences. Provides guidance to your family and loved ones, reducing the emotional burden of decision-making during critical moments.  Vision: Annual vision screenings are recommended for early detection of glaucoma, cataracts, and diabetic retinopathy. These exams can also reveal signs of chronic conditions such as diabetes and high blood pressure.  Dental: Annual dental screenings help detect early signs of oral cancer, gum disease, and other conditions linked to overall health, including heart disease and diabetes.  Please see the attached documents for additional preventive care recommendations.   Fall Prevention in the Home, Adult Falls can cause injuries and affect people of all ages. There are many simple things that you can do to make your home safe and to help prevent falls. If you need it, ask for help making these changes. What actions can I take to prevent falls? General information Use good lighting in all rooms. Make sure to: Replace any light bulbs that burn out. Turn on lights if it is dark and use night-lights. Keep items that you use often in easy-to-reach places. Lower the shelves around your home if needed. Move furniture so that there are clear paths around it. Do not keep throw rugs or other things on the floor that can make you trip. If any of your floors are uneven, fix them. Add color or contrast paint or tape to clearly mark and help you see: Grab bars or handrails. First and last steps of staircases. Where the edge of each step is. If you use  a ladder or stepladder: Make sure that it is fully opened. Do not climb a closed ladder. Make sure the sides of the ladder are locked in place. Have someone hold the ladder while you use it. Know where your pets are as you move through your home. What can I do in the bathroom?     Keep the floor dry. Clean up any water  that is on the floor right away. Remove soap buildup in the bathtub or  shower. Buildup makes bathtubs and showers slippery. Use non-skid mats or decals on the floor of the bathtub or shower. Attach bath mats securely with double-sided, non-slip rug tape. If you need to sit down while you are in the shower, use a non-slip stool. Install grab bars by the toilet and in the bathtub and shower. Do not use towel bars as grab bars. What can I do in the bedroom? Make sure that you have a light by your bed that is easy to reach. Do not use any sheets or blankets on your bed that hang to the floor. Have a firm bench or chair with side arms that you can use for support when you get dressed. What can I do in the kitchen? Clean up any spills right away. If you need to reach something above you, use a sturdy step stool that has a grab bar. Keep electrical cables out of the way. Do not use floor polish or wax that makes floors slippery. What can I do with my stairs? Do not leave anything on the stairs. Make sure that you have a light switch at the top and the bottom of the stairs. Have them installed if you do not have them. Make sure that there are handrails on both sides of the stairs. Fix handrails that are broken or loose. Make sure that handrails are as long as the staircases. Install non-slip stair treads on all stairs in your home if they do not have carpet. Avoid having throw rugs at the top or bottom of stairs, or secure the rugs with carpet tape to prevent them from moving. Choose a carpet design that does not hide the edge of steps on the stairs. Make sure that carpet is firmly attached to the stairs. Fix any carpet that is loose or worn. What can I do on the outside of my home? Use bright outdoor lighting. Repair the edges of walkways and driveways and fix any cracks. Clear paths of anything that can make you trip, such as tools or rocks. Add color or contrast paint or tape to clearly mark and help you see high doorway thresholds. Trim any bushes or trees on the  main path into your home. Check that handrails are securely fastened and in good repair. Both sides of all steps should have handrails. Install guardrails along the edges of any raised decks or porches. Have leaves, snow, and ice cleared regularly. Use sand, salt, or ice melt on walkways during winter months if you live where there is ice and snow. In the garage, clean up any spills right away, including grease or oil spills. What other actions can I take? Review your medicines with your health care provider. Some medicines can make you confused or feel dizzy. This can increase your chance of falling. Wear closed-toe shoes that fit well and support your feet. Wear shoes that have rubber soles and low heels. Use a cane, walker, scooter, or crutches that help you move around if needed. Talk with your provider about  other ways that you can decrease your risk of falls. This may include seeing a physical therapist to learn to do exercises to improve movement and strength. Where to find more information Centers for Disease Control and Prevention, [employer]: TonerPromos.no [employer] on Aging: BaseRingTones.pl National Institute on Aging: BaseRingTones.pl Contact a health care provider if: You are afraid of falling at home. You feel weak, drowsy, or dizzy at home. You fall at home. Get help right away if you: Lose consciousness or have trouble moving after a fall. Have a fall that causes a head injury. These symptoms may be an emergency. Get help right away. Call 911. Do not wait to see if the symptoms will go away. Do not drive yourself to the hospital. This information is not intended to replace advice given to you by your health care provider. Make sure you discuss any questions you have with your health care provider. Document Revised: 01/22/2022 Document Reviewed: 01/22/2022 Elsevier Patient Education  2024 [employer].

## 2024-02-19 NOTE — Progress Notes (Signed)
 Subjective:   Valerie Hanna is a 83 y.o. who presents for a Medicare Wellness preventive visit.  As a reminder, Annual Wellness Visits don't include a physical exam, and some assessments may be limited, especially if this visit is performed virtually. We may recommend an in-person follow-up visit with your provider if needed.  Visit Complete: Virtual I connected with  Valerie Hanna on 02/19/24 by a audio enabled telemedicine application and verified that I am speaking with the correct person using two identifiers.  Patient Location: Home  Provider Location: Home Office  I discussed the limitations of evaluation and management by telemedicine. The patient expressed understanding and agreed to proceed.  Vital Signs: Because this visit was a virtual/telehealth visit, some criteria may be missing or patient reported. Any vitals not documented were not able to be obtained and vitals that have been documented are patient reported.  VideoDeclined- This patient declined Librarian, academic. Therefore the visit was completed with audio only.  Persons Participating in Visit: Patient.  AWV Questionnaire: No: Patient Medicare AWV questionnaire was not completed prior to this visit.  Cardiac Risk Factors include: advanced age (>75men, >1 women);dyslipidemia;hypertension     Objective:    Today's Vitals   02/19/24 1115  Weight: 115 lb (52.2 kg)  Height: 5' 2 (1.575 m)   Body mass index is 21.03 kg/m.     02/19/2024   11:38 AM 09/20/2023   10:15 AM 05/09/2022    1:10 PM 06/29/2021    7:21 AM 05/03/2021    1:32 PM 05/02/2020   11:45 AM 04/13/2019   10:53 AM  Advanced Directives  Does Patient Have a Medical Advance Directive? No No Yes No Yes Yes Yes  Type of Advance Directive   Living will  Healthcare Power of [place];Living will Healthcare Power of [place];Living will Healthcare Power of [place];Living will  Copy of Healthcare Power of Attorney in  Chart?     No - copy requested No - copy requested No - copy requested  Would patient like information on creating a medical advance directive? No - Patient declined   No - Patient declined       Current Medications (verified) Outpatient Encounter Medications as of 02/19/2024  Medication Sig   Acetaminophen  (ARTHRITIS PAIN PO) Take 2 tablets by mouth daily as needed (Arthritis).   acetaminophen  (TYLENOL ) 500 MG tablet Take 1,000 mg by mouth every 6 (six) hours as needed for moderate pain or mild pain.   albuterol  (VENTOLIN  HFA) 108 (90 Base) MCG/ACT inhaler TAKE 2 PUFFS BY MOUTH EVERY 6 HOURS AS NEEDED FOR WHEEZE OR SHORTNESS OF BREATH   alendronate  (FOSAMAX ) 70 MG tablet Take 1 tablet (70 mg total) by mouth every 7 (seven) days. Take with a full glass of water  on an empty stomach.   ALPRAZolam  (XANAX ) 0.5 MG tablet TAKE 1 TABLET BY MOUTH TWICE A DAY AS NEEDED   amoxicillin -clavulanate (AUGMENTIN ) 875-125 MG tablet Take 1 tablet by mouth 2 (two) times daily.   CALCIUM  PO Take 1 tablet by mouth daily.   Cholecalciferol (VITAMIN D3 PO) Take 1 tablet by mouth daily.   clopidogrel  (PLAVIX ) 75 MG tablet TAKE 1 TABLET BY MOUTH EVERY DAY   fexofenadine (ALLEGRA) 60 MG tablet Take 60 mg by mouth daily.   gentamicin  ointment (GARAMYCIN ) 0.1 % Apply 1 Application topically daily. Apply to wound daily   lisinopril  (ZESTRIL ) 10 MG tablet Take 1 tablet (10 mg total) by mouth daily.   mirtazapine  (REMERON ) 30  MG tablet TAKE 1 TABLET BY MOUTH EVERYDAY AT BEDTIME   Multiple Vitamin (MULTIVITAMIN WITH MINERALS) TABS tablet Take 1 tablet by mouth daily.   pantoprazole  (PROTONIX ) 40 MG tablet TAKE 1 TABLET BY MOUTH TWICE A DAY   sertraline  (ZOLOFT ) 100 MG tablet Take 1 tablet (100 mg total) by mouth daily.   simvastatin  (ZOCOR ) 40 MG tablet TAKE 1 TABLET BY MOUTH DAILY AT 6 PM.   traZODone  (DESYREL ) 50 MG tablet TAKE 1 TABLET BY MOUTH AT BEDTIME AS NEEDED FOR SLEEP.   oxyCODONE  (ROXICODONE ) 5 MG immediate  release tablet Take 0.5-1 tablets (2.5-5 mg total) by mouth every 8 (eight) hours as needed for severe pain (pain score 7-10). (Patient not taking: Reported on 02/19/2024)   triamcinolone  cream (KENALOG ) 0.1 %  (Patient not taking: Reported on 02/19/2024)   No facility-administered encounter medications on file as of 02/19/2024.    Allergies (verified) Cefuroxime  axetil and Aspirin   History: Past Medical History:  Diagnosis Date   Allergy    Anemia    Anxiety    Arthritis    Cancer (HCC)    melanoma  ON HER BACK MANY YRS AGO   Constipation    Dehydration with hyponatremia 05/26/2015   Depression    GERD (gastroesophageal reflux disease)    H/O hiatal hernia    Headache(784.0)    High cholesterol    History of kidney infection    Hypertension    no longer taking meds since stroke   Keratosis, inflamed seborrheic 01/18/2017   Osteoporosis    Pleurisy    being treated for this now 01/25/14 (left side)   Stroke (HCC)    2014, WITH LEFT SIDED WEAKNESS   Past Surgical History:  Procedure Laterality Date   ABDOMINAL HYSTERECTOMY     APPENDECTOMY     BACK SURGERY     kyphoplasty   bladder tack     CATARACT EXTRACTION W/PHACO Left 06/29/2021   Procedure: CATARACT EXTRACTION PHACO AND INTRAOCULAR LENS PLACEMENT (IOC) LEFT 11.45 01:25.4;  Surgeon: Mittie Gaskin, MD;  Location: [facility] ORS;  Service: Ophthalmology;  Laterality: Left;   CHOLECYSTECTOMY     COLONOSCOPY     DILATION AND CURETTAGE OF UTERUS     EYE SURGERY Right    cataract removed   EYE SURGERY Right    bubble put in for a hole in retina   FOOT SURGERY Bilateral    KYPHOPLASTY N/A 01/28/2014   Procedure: LUMBAR FOUR KYPHOPLASTY;  Surgeon: Rockey Peru, MD;  Location: [facility];  Service: Neurosurgery;  Laterality: N/A;  L4 Kyphoplasty   KYPHOPLASTY N/A 04/18/2016   Procedure: KYPHOPLASTY Thoracic eight-Thoracic ten;  Surgeon: Rockey Peru, MD;  [facility];  Service: Neurosurgery;  Laterality: N/A;   KYPHOPLASTY T8-T10   TOTAL HIP ARTHROPLASTY Right 05/26/2015   Procedure: RIGHT BIPOLAR VS TOTAL HIP ANTERIOR APPROACH;  Surgeon: Lonni CINDERELLA Poli, MD;  Location: [facility] OR;  Service: Orthopedics;  Laterality: Right;   Family History  Problem Relation Age of Onset   Heart attack Mother    Alcohol abuse Father    Healthy Sister    Healthy Sister    Social History   Socioeconomic History   Marital status: Married    Spouse name: Valerie Hanna   Number of children: 1   Years of education: Not on file   Highest education level: 11th grade  Occupational History   Occupation: Retired  Tobacco Use   Smoking status: Former    Current packs/day: 0.00  Average packs/day: 0.5 packs/day for 30.0 years (15.0 ttl pk-yrs)    Types: Cigarettes    Start date: 04/15/1986    Quit date: 04/15/2016    Years since quitting: 7.8   Smokeless tobacco: Never   Tobacco comments:    smoking cessation materials not required  Vaping Use   Vaping status: Never Used  Substance and Sexual Activity   Alcohol use: Yes    Alcohol/week: 14.0 standard drinks of alcohol    Types: 14 Glasses of wine per week    Comment: 2 glasses of wine nightly   Drug use: No   Sexual activity: Not Currently  Other Topics Concern   Not on file  Social History Narrative   Pt lives with husband, only child lives in a group home.    Social Drivers of Corporate investment banker Strain: Low Risk  (02/19/2024)   Overall Financial Resource Strain (CARDIA)    Difficulty of Paying Living Expenses: Not hard at all  Food Insecurity: No Food Insecurity (02/19/2024)   Hunger Vital Sign    Worried About Running Out of Food in the Last Year: Never true    Ran Out of Food in the Last Year: Never true  Transportation Needs: No Transportation Needs (02/19/2024)   PRAPARE - Administrator, Civil Service (Medical): No    Lack of Transportation (Non-Medical): No  Physical Activity: Inactive (02/19/2024)   Exercise Vital Sign     Days of Exercise per Week: 0 days    Minutes of Exercise per Session: 0 min  Stress: No Stress Concern Present (02/19/2024)   [employer] of Occupational Health - Occupational Stress Questionnaire    Feeling of Stress: Only a little  Social Connections: Moderately Integrated (02/19/2024)   Social Connection and Isolation Panel    Frequency of Communication with Friends and Family: More than three times a week    Frequency of Social Gatherings with Friends and Family: Once a week    Attends Religious Services: More than 4 times per year    Active Member of [employer] or Organizations: No    Attends Engineer, structural: Never    Marital Status: Married    Tobacco Counseling Counseling given: Not Answered Tobacco comments: smoking cessation materials not required    Clinical Intake:  Pre-visit preparation completed: Yes  Pain : No/denies pain     BMI - recorded: 21.03 Nutritional Status: BMI of 19-24  Normal Nutritional Risks: Unintentional weight loss, Nausea/ vomitting/ diarrhea (diarrhea) Diabetes: No  No results found for: HGBA1C   How often do you need to have someone help you when you read instructions, pamphlets, or other written materials from your doctor or pharmacy?: 1 - Never  Interpreter Needed?: No  Information entered by :: Valerie Hanna, CMA   Activities of Daily Living     02/19/2024   11:20 AM  In your present state of health, do you have any difficulty performing the following activities:  Hearing? 0  Vision? 0  Difficulty concentrating or making decisions? 1  Comment sometimes  Walking or climbing stairs? 1  Comment uses a rollator walker  Dressing or bathing? 0  Doing errands, shopping? 1  Comment doesn't drive, husband takes to appointments  Preparing Food and eating ? N  Using the Toilet? N  In the past six months, have you accidently leaked urine? N  Do you have problems with loss of bowel control? Y  Comment wears  depends  Managing your  Medications? N  Managing your Finances? Y  Comment husband Neurosurgeon or managing your Housekeeping? N    Patient Care Team: Justus Leita DEL, MD as PCP - General (Internal Medicine) Mittie Gaskin, MD as Referring Physician (Ophthalmology) Silva Juliene SAUNDERS, DPM as Consulting Physician (Podiatry) Hilma Hastings, PA-C as Physician Assistant (Neurosurgery)  I have updated your Care Teams any recent Medical Services you may have received from other providers in the past year.     Assessment:   This is a routine wellness examination for Valerie Hanna.  Hearing/Vision screen Hearing Screening - Comments:: Denies hearing loss  Vision Screening - Comments:: Gets routine eye exams, Dr Mittie @ [facility] [place]   Goals Addressed               This Visit's Progress     DIET - INCREASE WATER  INTAKE (pt-stated)         Depression Screen     02/19/2024   11:35 AM 08/15/2023    9:44 AM 06/17/2023    3:35 PM 02/15/2023   10:19 AM 02/08/2023   10:55 AM 08/08/2022   10:52 AM 05/09/2022    1:09 PM  PHQ 2/9 Scores  PHQ - 2 Score 1 2 2 2 2 3 4   PHQ- 9 Score 5 7 8 9 9 5 8     Fall Risk     02/19/2024   11:40 AM 08/15/2023    9:43 AM 06/17/2023    3:34 PM 02/15/2023   10:19 AM 02/08/2023   10:55 AM  Fall Risk   Falls in the past year? 1 0 0 1 1  Number falls in past yr: 1 0 0 1 1  Injury with Fall? 0 0 0 1 1  Risk for fall due to : History of fall(s);Impaired balance/gait;Orthopedic patient;Impaired mobility No Fall Risks History of fall(s)  History of fall(s);Impaired balance/gait  Follow up Falls evaluation completed;Education provided Falls evaluation completed Falls evaluation completed  Falls evaluation completed    MEDICARE RISK AT HOME:  Medicare Risk at Home Any stairs in or around the home?: Yes (3 steps) If so, are there any without handrails?: Yes Home free of loose throw rugs in walkways, pet beds, electrical cords,  etc?: Yes Adequate lighting in your home to reduce risk of falls?: Yes Life alert?: No Use of a cane, walker or w/c?: Yes (rollator walker) Grab bars in the bathroom?: Yes Shower chair or bench in shower?: Yes Elevated toilet seat or a handicapped toilet?: Yes  TIMED UP AND GO:  Was the test performed?  No  Cognitive Function: 6CIT completed        02/19/2024   11:43 AM 05/09/2022    1:11 PM 05/02/2020   11:49 AM 04/13/2019   10:57 AM 01/20/2018    1:57 PM  6CIT Screen  What Year? 0 points 4 points 0 points 0 points 0 points  What month? 0 points 0 points 0 points 0 points 0 points  What time? 0 points 0 points 0 points 0 points 0 points  Count back from 20 0 points 2 points 0 points 0 points 0 points  Months in reverse 4 points 2 points 2 points 4 points 0 points  Repeat phrase 0 points 2 points 4 points 2 points 4 points  Total Score 4 points 10 points 6 points 6 points 4 points    Immunizations  There is no immunization history on file for this patient.  Screening Tests Health Maintenance  Topic Date Due   Zoster Vaccines- Shingrix (1 of 2) Never done   DTaP/Tdap/Td (1 - Tdap) 08/14/2024 (Originally 01/15/1960)   Pneumococcal Vaccine: 50+ Years (1 of 1 - PCV) 08/14/2024 (Originally 01/15/1993)   Influenza Vaccine  09/01/2024 (Originally 01/03/2024)   Medicare Annual Wellness (AWV)  02/18/2025   HPV VACCINES  Aged Out   Meningococcal B Vaccine  Aged Out   Colonoscopy  Discontinued   COVID-19 Vaccine  Discontinued   Hepatitis C Screening  Discontinued    Health Maintenance Items Addressed: See Nurse Notes at the end of this note  Additional Screening:  Vision Screening: Recommended annual ophthalmology exams for early detection of glaucoma and other disorders of the eye. Is the patient up to date with their annual eye exam?  Yes  Who is the provider or what is the name of the office in which the patient attends annual eye exams? Dr. Mittie @ [facility]  [facility]  Dental Screening: Recommended annual dental exams for proper oral hygiene  Community Resource Referral / Chronic Care Management: CRR required this visit?  No   CCM required this visit?  No   Plan:    I have personally reviewed and noted the following in the patient's chart:   Medical and social history Use of alcohol, tobacco or illicit drugs  Current medications and supplements including opioid prescriptions. Patient is not currently taking opioid prescriptions. Functional ability and status Nutritional status Physical activity Advanced directives List of other physicians Hospitalizations, surgeries, and ER visits in previous 12 months Vitals Screenings to include cognitive, depression, and falls Referrals and appointments  In addition, I have reviewed and discussed with patient certain preventive protocols, quality metrics, and best practice recommendations. A written personalized care plan for preventive services as well as general preventive health recommendations were provided to patient.   Valerie Hanna, CMA   02/19/2024   After Visit Summary: (Mail) Due to this being a telephonic visit, the after visit summary with patients personalized plan was offered to patient via mail   Notes:  6 CIT Score - 4 Declined all vaccines Declined DEXA Screening colonoscopy and MMG no longer recommended due to age

## 2024-02-21 ENCOUNTER — Encounter: Payer: Self-pay | Admitting: Internal Medicine

## 2024-02-21 ENCOUNTER — Ambulatory Visit (INDEPENDENT_AMBULATORY_CARE_PROVIDER_SITE_OTHER): Admitting: Internal Medicine

## 2024-02-21 VITALS — BP 136/85 | HR 88 | Temp 98.8°F | Resp 22 | Ht 62.0 in | Wt 118.0 lb

## 2024-02-21 DIAGNOSIS — K2100 Gastro-esophageal reflux disease with esophagitis, without bleeding: Secondary | ICD-10-CM | POA: Diagnosis not present

## 2024-02-21 DIAGNOSIS — L97511 Non-pressure chronic ulcer of other part of right foot limited to breakdown of skin: Secondary | ICD-10-CM

## 2024-02-21 DIAGNOSIS — F324 Major depressive disorder, single episode, in partial remission: Secondary | ICD-10-CM

## 2024-02-21 DIAGNOSIS — I10 Essential (primary) hypertension: Secondary | ICD-10-CM | POA: Diagnosis not present

## 2024-02-21 DIAGNOSIS — R197 Diarrhea, unspecified: Secondary | ICD-10-CM | POA: Insufficient documentation

## 2024-02-21 DIAGNOSIS — M8000XS Age-related osteoporosis with current pathological fracture, unspecified site, sequela: Secondary | ICD-10-CM | POA: Diagnosis not present

## 2024-02-21 NOTE — Assessment & Plan Note (Signed)
 Reflux symptoms are controlled on pantoprazole . Patient denies dysphagia, reflux, vomiting
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
## 2024-02-21 NOTE — Assessment & Plan Note (Signed)
 Blood pressure is well controlled on lisinopril . No medication side effects noted. Plan to continue current medications.

## 2024-02-21 NOTE — Patient Instructions (Signed)
 If symptoms worsen and you are unable to eat or drink, please go to the Emergency Room.  Please submit the stool sample as soon as possible.

## 2024-02-21 NOTE — Assessment & Plan Note (Addendum)
 Persistent watery diarrhea for 4 months; associated anorexia but no nausea or vomiting Weight loss of 9 lbs since last visit here in March Suspect infectious etiology - will get GI profile and labs She is on Augmentin  which would cover diverticulitis. She can continue imodium but has not had any perceived benefit. If worsening and unable to get the labs or stool sample, would recommend ED evaluation

## 2024-02-21 NOTE — Assessment & Plan Note (Signed)
 Clinically stable on Sertraline, Remeron and Trazodone.   No SI or HI on evaluation. Plan to continue same medications for now.

## 2024-02-21 NOTE — Assessment & Plan Note (Signed)
 Seen by Podiatry on 02/12/24 and started on topicals and Augmentin . She does not think the toes is much improved. Rec finishing the course of Augmentin  and follow up with podiatry

## 2024-02-24 ENCOUNTER — Other Ambulatory Visit: Payer: Self-pay | Admitting: Internal Medicine

## 2024-02-24 ENCOUNTER — Telehealth: Payer: Self-pay | Admitting: Internal Medicine

## 2024-02-24 DIAGNOSIS — K2100 Gastro-esophageal reflux disease with esophagitis, without bleeding: Secondary | ICD-10-CM

## 2024-02-24 DIAGNOSIS — F411 Generalized anxiety disorder: Secondary | ICD-10-CM

## 2024-02-24 DIAGNOSIS — F324 Major depressive disorder, single episode, in partial remission: Secondary | ICD-10-CM

## 2024-02-24 NOTE — Telephone Encounter (Unsigned)
 Copied from CRM #[id number]. Topic: Clinical - Lab/Test Results >> Feb 24, 2024  5:34 PM Donee H wrote: Reason for CRM: Patient stated she returned stool sample back to lab and wanted to know results. There are no results lists. Patient would like someone pcp Leita Nani ir her nurse to call her regarding lab test and progress. Please follow up with patient.

## 2024-02-25 NOTE — Telephone Encounter (Signed)
 Patient informed. She said she wants to stop abx due to difficulty swallowing them. She will come in or call back if diarrhea does not get better. She also started to have a sore throat and cough 2 days ago. Told her if it get worse, to come in and be seen.  - Rasool Rommel M.

## 2024-02-25 NOTE — Telephone Encounter (Signed)
 Requested medications are due for refill today.  Too soon  Requested medications are on the active medications list.  yes  Last refill. 02/04/2024 #60 0 rf  Future visit scheduled.   A wellness visit next year  Notes to clinic.  Refill not delegated.    Requested Prescriptions  Pending Prescriptions Disp Refills   ALPRAZolam  (XANAX ) 0.5 MG tablet [Pharmacy Med Name: ALPRAZOLAM  0.5 MG TABLET] 60 tablet 0    Sig: TAKE 1 TABLET BY MOUTH TWICE A DAY AS NEEDED     Not Delegated - Psychiatry: Anxiolytics/Hypnotics 2 Failed - 02/25/2024  1:53 PM      Failed - This refill cannot be delegated      Failed - Urine Drug Screen completed in last 360 days      Passed - Patient is not pregnant      Passed - Valid encounter within last 6 months    Recent Outpatient Visits           4 days ago Essential hypertension   [place] [facility] at [facility], Leita DEL, MD   6 months ago Annual physical exam   [facility] at [facility], Leita DEL, MD              Signed Prescriptions Disp Refills   pantoprazole  (PROTONIX ) 40 MG tablet 180 tablet 1    Sig: TAKE 1 TABLET BY MOUTH TWICE A DAY     Gastroenterology: Proton Pump Inhibitors Passed - 02/25/2024  1:53 PM      Passed - Valid encounter within last 12 months    Recent Outpatient Visits           4 days ago Essential hypertension   [place] [facility] at [facility], Leita DEL, MD   6 months ago Annual physical exam   [facility] at [facility], Leita DEL, MD

## 2024-02-25 NOTE — Telephone Encounter (Signed)
 Requested Prescriptions  Pending Prescriptions Disp Refills   ALPRAZolam  (XANAX ) 0.5 MG tablet [Pharmacy Med Name: ALPRAZOLAM  0.5 MG TABLET] 60 tablet 0    Sig: TAKE 1 TABLET BY MOUTH TWICE A DAY AS NEEDED     Not Delegated - Psychiatry: Anxiolytics/Hypnotics 2 Failed - 02/25/2024  1:51 PM      Failed - This refill cannot be delegated      Failed - Urine Drug Screen completed in last 360 days      Passed - Patient is not pregnant      Passed - Valid encounter within last 6 months    Recent Outpatient Visits           4 days ago Essential hypertension   [place] [facility] at [facility], Leita DEL, MD   6 months ago Annual physical exam   [facility] at [facility], Leita DEL, MD               pantoprazole  (PROTONIX ) 40 MG tablet [Pharmacy Med Name: PANTOPRAZOLE  SOD DR 40 MG TAB] 180 tablet 1    Sig: TAKE 1 TABLET BY MOUTH TWICE A DAY     Gastroenterology: Proton Pump Inhibitors Passed - 02/25/2024  1:51 PM      Passed - Valid encounter within last 12 months    Recent Outpatient Visits           4 days ago Essential hypertension   [place] [facility] at [facility], Leita DEL, MD   6 months ago Annual physical exam   [facility] Medicine at [facility], Leita DEL, MD

## 2024-02-25 NOTE — Telephone Encounter (Signed)
 Please review.  KP

## 2024-02-26 LAB — GI PROFILE, STOOL, PCR

## 2024-02-26 LAB — CBC WITH DIFFERENTIAL/PLATELET

## 2024-02-26 LAB — COMPREHENSIVE METABOLIC PANEL WITH GFR

## 2024-03-11 ENCOUNTER — Ambulatory Visit: Admitting: Podiatry

## 2024-03-11 VITALS — Ht 62.0 in | Wt 118.0 lb

## 2024-03-11 DIAGNOSIS — L97511 Non-pressure chronic ulcer of other part of right foot limited to breakdown of skin: Secondary | ICD-10-CM | POA: Diagnosis not present

## 2024-03-12 NOTE — Patient Instructions (Signed)
 More silicone pads can be purchased from:  [email or web address]

## 2024-03-12 NOTE — Progress Notes (Signed)
  Subjective:  Patient ID: Valerie Hanna, female    DOB: 1941/01/07,  MRN: [id number]  Chief Complaint  Patient presents with   Wound Check    Rm 2 Patient is for ulcer of the right index toe. Wound is scabbed over no drainage.    83 y.o. female presents with the above complaint. History confirmed with patient.  She returns for follow-up notes quite a bit of improvement  Objective:  Physical Exam: Right foot is warm and is relatively well-perfused feet there is a full-thickness ulceration on the right second toe patient has healed   Assessment:   1. Ulcer of right second toe, limited to breakdown of skin [facility])      Plan:  Patient was evaluated and treated and all questions answered.  Doing much better.  Does not need further bandaging or wound care at this point.  Discussed offloading silicone pads which were dispensed.  Discussed appropriate shoe gear.  To follow-up with us  as needed for this and other issues.  Return if symptoms worsen or fail to improve.

## 2024-03-27 ENCOUNTER — Other Ambulatory Visit: Payer: Self-pay | Admitting: Internal Medicine

## 2024-03-27 DIAGNOSIS — F324 Major depressive disorder, single episode, in partial remission: Secondary | ICD-10-CM

## 2024-03-28 NOTE — Telephone Encounter (Signed)
 Requested Prescriptions  Pending Prescriptions Disp Refills   traZODone  (DESYREL ) 50 MG tablet [Pharmacy Med Name: TRAZODONE  50 MG TABLET] 90 tablet 0    Sig: TAKE 1 TABLET BY MOUTH AT BEDTIME AS NEEDED FOR SLEEP.     Psychiatry: Antidepressants - Serotonin Modulator Passed - 03/28/2024  9:47 AM      Passed - Completed PHQ-2 or PHQ-9 in the last 360 days      Passed - Valid encounter within last 6 months    Recent Outpatient Visits           1 month ago Essential hypertension   [place] [facility] at [facility], Leita DEL, MD   7 months ago Annual physical exam   [facility] at [facility], Leita DEL, MD

## 2024-03-30 ENCOUNTER — Other Ambulatory Visit: Payer: Self-pay | Admitting: Internal Medicine

## 2024-03-30 DIAGNOSIS — F411 Generalized anxiety disorder: Secondary | ICD-10-CM

## 2024-03-30 DIAGNOSIS — F324 Major depressive disorder, single episode, in partial remission: Secondary | ICD-10-CM

## 2024-04-01 ENCOUNTER — Other Ambulatory Visit: Payer: Self-pay | Admitting: Internal Medicine

## 2024-04-01 DIAGNOSIS — F324 Major depressive disorder, single episode, in partial remission: Secondary | ICD-10-CM

## 2024-04-01 DIAGNOSIS — I69359 Hemiplegia and hemiparesis following cerebral infarction affecting unspecified side: Secondary | ICD-10-CM

## 2024-04-01 NOTE — Telephone Encounter (Signed)
 Please review.  KP

## 2024-04-01 NOTE — Telephone Encounter (Signed)
 Requested medication (s) are due for refill today:   Requested medication (s) are on the active medication list: Yes  Last refill:  03/05/24  Future visit scheduled: No  Notes to clinic:  Not delegated.    Requested Prescriptions  Pending Prescriptions Disp Refills   ALPRAZolam  (XANAX ) 0.5 MG tablet [Pharmacy Med Name: ALPRAZOLAM  0.5 MG TABLET] 60 tablet 0    Sig: TAKE 1 TABLET BY MOUTH 2 TIMES DAILY AS NEEDED.     Not Delegated - Psychiatry: Anxiolytics/Hypnotics 2 Failed - 04/01/2024 10:27 AM      Failed - This refill cannot be delegated      Failed - Urine Drug Screen completed in last 360 days      Passed - Patient is not pregnant      Passed - Valid encounter within last 6 months    Recent Outpatient Visits           1 month ago Essential hypertension   [place] [facility] at [facility], Leita DEL, MD   7 months ago Annual physical exam   [facility] at [facility], Leita DEL, MD

## 2024-04-02 NOTE — Telephone Encounter (Signed)
 Requested Prescriptions  Pending Prescriptions Disp Refills   mirtazapine  (REMERON ) 30 MG tablet [Pharmacy Med Name: MIRTAZAPINE  30 MG TABLET] 90 tablet 0    Sig: TAKE 1 TABLET BY MOUTH EVERYDAY AT BEDTIME     Psychiatry: Antidepressants - mirtazapine  Passed - 04/02/2024  1:51 PM      Passed - Completed PHQ-2 or PHQ-9 in the last 360 days      Passed - Valid encounter within last 6 months    Recent Outpatient Visits           1 month ago Essential hypertension   [place] [facility] at [facility], Leita DEL, MD   7 months ago Annual physical exam   [facility] at [facility], Leita DEL, MD               clopidogrel  (PLAVIX ) 75 MG tablet [Pharmacy Med Name: CLOPIDOGREL  75 MG TABLET] 90 tablet 0    Sig: TAKE 1 TABLET BY MOUTH EVERY DAY     Hematology: Antiplatelets - clopidogrel  Failed - 04/02/2024  1:51 PM      Failed - HCT in normal range and within 180 days    Hematocrit  Date Value Ref Range Status  02/21/2024 CANCELED      Comment:    Test not performed  Result canceled by the ancillary.          Failed - HGB in normal range and within 180 days    Hemoglobin  Date Value Ref Range Status  02/21/2024 CANCELED      Comment:    Test not performed  Result canceled by the ancillary.          Failed - PLT in normal range and within 180 days    Platelets  Date Value Ref Range Status  02/21/2024 CANCELED      Comment:    Test not performed  Result canceled by the ancillary.          Failed - Cr in normal range and within 360 days    Creatinine  Date Value Ref Range Status  02/23/2013 0.45 (L) 0.60 - 1.30 mg/dL Final   Creatinine, Ser  Date Value Ref Range Status  02/21/2024 CANCELED mg/dL     Comment:    Test not performed. No serum received.  Result canceled by the ancillary.          Passed - Valid encounter within last 6 months    Recent Outpatient Visits            1 month ago Essential hypertension   [place] [facility] at [facility], Leita DEL, MD   7 months ago Annual physical exam   [facility] at [facility], Leita DEL, MD

## 2024-04-13 ENCOUNTER — Other Ambulatory Visit: Payer: Self-pay | Admitting: Internal Medicine

## 2024-04-13 DIAGNOSIS — I69359 Hemiplegia and hemiparesis following cerebral infarction affecting unspecified side: Secondary | ICD-10-CM

## 2024-04-15 NOTE — Telephone Encounter (Signed)
 Rx 04/02/24 #90 - too soon Requested Prescriptions  Pending Prescriptions Disp Refills   clopidogrel  (PLAVIX ) 75 MG tablet [Pharmacy Med Name: CLOPIDOGREL  75 MG TABLET] 90 tablet 0    Sig: TAKE 1 TABLET BY MOUTH EVERY DAY     Hematology: Antiplatelets - clopidogrel  Failed - 04/15/2024  3:45 PM      Failed - HCT in normal range and within 180 days    Hematocrit  Date Value Ref Range Status  02/21/2024 CANCELED      Comment:    Test not performed  Result canceled by the ancillary.          Failed - HGB in normal range and within 180 days    Hemoglobin  Date Value Ref Range Status  02/21/2024 CANCELED      Comment:    Test not performed  Result canceled by the ancillary.          Failed - PLT in normal range and within 180 days    Platelets  Date Value Ref Range Status  02/21/2024 CANCELED      Comment:    Test not performed  Result canceled by the ancillary.          Failed - Cr in normal range and within 360 days    Creatinine  Date Value Ref Range Status  02/23/2013 0.45 (L) 0.60 - 1.30 mg/dL Final   Creatinine, Ser  Date Value Ref Range Status  02/21/2024 CANCELED mg/dL     Comment:    Test not performed. No serum received.  Result canceled by the ancillary.          Passed - Valid encounter within last 6 months    Recent Outpatient Visits           1 month ago Essential hypertension   [place] [facility] at [facility], Leita DEL, MD   8 months ago Annual physical exam   [facility] at [facility], Leita DEL, MD

## 2024-06-22 ENCOUNTER — Other Ambulatory Visit: Payer: Self-pay | Admitting: Student

## 2024-06-22 DIAGNOSIS — I69359 Hemiplegia and hemiparesis following cerebral infarction affecting unspecified side: Secondary | ICD-10-CM

## 2024-06-22 DIAGNOSIS — F411 Generalized anxiety disorder: Secondary | ICD-10-CM

## 2024-06-22 DIAGNOSIS — F324 Major depressive disorder, single episode, in partial remission: Secondary | ICD-10-CM

## 2024-06-22 NOTE — Telephone Encounter (Unsigned)
 Copied from CRM [id number]. Topic: Clinical - Medication Refill >> Jun 22, 2024  9:16 AM Montie POUR wrote: Medication:  sertraline  (ZOLOFT ) 100 MG tablet mirtazapine  (REMERON ) 30 MG tablet ALPRAZolam  (XANAX ) 0.5 MG tablet clopidogrel  (PLAVIX ) 75 MG tablet traZODone  (DESYREL ) 50 MG tablet   Has the patient contacted their pharmacy? Yes (Agent: If no, request that the patient contact the pharmacy for the refill. If patient does not wish to contact the pharmacy document the reason why and proceed with request.) (Agent: If yes, when and what did the pharmacy advise?)  Pharmacy needs order to refill  This is the patient's preferred pharmacy:  [id number] - [place], [place] - [address] [place] [address] Phone: [phone] Fax: [phone]  Is this the correct pharmacy for this prescription? Yes If no, delete pharmacy and type the correct one.   Has the prescription been filled recently? No  Is the patient out of the medication? Yes  Has the patient been seen for an appointment in the last year OR does the patient have an upcoming appointment? Yes - August 05, 2023  Can we respond through MyChart? No  Agent: Please be advised that Rx refills may take up to 3 business days. We ask that you follow-up with your pharmacy.

## 2024-06-23 MED ORDER — CLOPIDOGREL BISULFATE 75 MG PO TABS: 75.0000 mg | ORAL_TABLET | Freq: Every day | ORAL | 0 refills | Status: AC

## 2024-06-23 MED ORDER — MIRTAZAPINE 30 MG PO TABS: 30.0000 mg | ORAL_TABLET | Freq: Every day | ORAL | 0 refills | Status: AC

## 2024-06-23 MED ORDER — SERTRALINE HCL 100 MG PO TABS: 100.0000 mg | ORAL_TABLET | Freq: Every day | ORAL | 0 refills | Status: AC

## 2024-06-23 MED ORDER — ALPRAZOLAM 0.5 MG PO TABS: 0.5000 mg | ORAL_TABLET | Freq: Every evening | ORAL | 2 refills | Status: AC | PRN

## 2024-06-23 MED ORDER — TRAZODONE HCL 50 MG PO TABS: 50.0000 mg | ORAL_TABLET | Freq: Every evening | ORAL | 0 refills | Status: AC | PRN

## 2024-06-23 NOTE — Telephone Encounter (Signed)
 Please review.  KP

## 2024-06-23 NOTE — Telephone Encounter (Signed)
 Requested Prescriptions  Pending Prescriptions Disp Refills   sertraline  (ZOLOFT ) 100 MG tablet 90 tablet 0    Sig: Take 1 tablet (100 mg total) by mouth daily.     Psychiatry:  Antidepressants - SSRI - sertraline  Passed - 06/23/2024 10:11 AM      Passed - AST in normal range and within 360 days    AST  Date Value Ref Range Status  02/21/2024 CANCELED IU/L     Comment:    Test not performed. No serum received.  Result canceled by the ancillary.    SGOT(AST)  Date Value Ref Range Status  02/01/2013 38 (H) 15 - 37 Unit/L Final         Passed - ALT in normal range and within 360 days    ALT  Date Value Ref Range Status  02/21/2024 CANCELED IU/L     Comment:    Test not performed. No serum received.  Result canceled by the ancillary.    SGPT (ALT)  Date Value Ref Range Status  02/01/2013 21 12 - 78 U/L Final         Passed - Completed PHQ-2 or PHQ-9 in the last 360 days      Passed - Valid encounter within last 6 months    Recent Outpatient Visits           4 months ago Essential hypertension   [place] [facility] at [facility], Leita DEL, MD   10 months ago Annual physical exam   [place] [facility], Leita DEL, MD               traZODone  (DESYREL ) 50 MG tablet 90 tablet 0    Sig: Take 1 tablet (50 mg total) by mouth at bedtime as needed. for sleep     Psychiatry: Antidepressants - Serotonin Modulator Passed - 06/23/2024 10:11 AM      Passed - Completed PHQ-2 or PHQ-9 in the last 360 days      Passed - Valid encounter within last 6 months    Recent Outpatient Visits           4 months ago Essential hypertension   [place] [facility] at [facility], Leita DEL, MD   10 months ago Annual physical exam   [facility] at [facility], Leita DEL, MD               mirtazapine  (REMERON ) 30 MG  tablet 90 tablet 0     Psychiatry: Antidepressants - mirtazapine  Passed - 06/23/2024 10:11 AM      Passed - Completed PHQ-2 or PHQ-9 in the last 360 days      Passed - Valid encounter within last 6 months    Recent Outpatient Visits           4 months ago Essential hypertension   [place] [facility] at [facility], Leita DEL, MD   10 months ago Annual physical exam   [facility] at [facility], Leita DEL, MD               clopidogrel  (PLAVIX ) 75 MG tablet 90 tablet 0    Sig: Take 1 tablet (75 mg total) by mouth daily.     Hematology: Antiplatelets - clopidogrel  Failed - 06/23/2024 10:11 AM  Failed - HCT in normal range and within 180 days    Hematocrit  Date Value Ref Range Status  02/21/2024 CANCELED      Comment:    Test not performed  Result canceled by the ancillary.          Failed - HGB in normal range and within 180 days    Hemoglobin  Date Value Ref Range Status  02/21/2024 CANCELED      Comment:    Test not performed  Result canceled by the ancillary.          Failed - PLT in normal range and within 180 days    Platelets  Date Value Ref Range Status  02/21/2024 CANCELED      Comment:    Test not performed  Result canceled by the ancillary.          Failed - Cr in normal range and within 360 days    Creatinine  Date Value Ref Range Status  02/23/2013 0.45 (L) 0.60 - 1.30 mg/dL Final   Creatinine, Ser  Date Value Ref Range Status  02/21/2024 CANCELED mg/dL     Comment:    Test not performed. No serum received.  Result canceled by the ancillary.          Passed - Valid encounter within last 6 months    Recent Outpatient Visits           4 months ago Essential hypertension   [place] [facility] at [facility], Leita DEL, MD   10 months ago Annual physical exam   [facility] at [facility], Leita DEL, MD               ALPRAZolam  (XANAX ) 0.5 MG tablet 60 tablet 2     Not Delegated - Psychiatry: Anxiolytics/Hypnotics 2 Failed - 06/23/2024 10:11 AM      Failed - This refill cannot be delegated      Failed - Urine Drug Screen completed in last 360 days      Passed - Patient is not pregnant      Passed - Valid encounter within last 6 months    Recent Outpatient Visits           4 months ago Essential hypertension   [place] [facility] at [facility], Leita DEL, MD   10 months ago Annual physical exam   [facility] at [facility], Leita DEL, MD

## 2024-06-23 NOTE — Telephone Encounter (Signed)
 Requested medications are due for refill today.  yes  Requested medications are on the active medications list.  yes  Last refill. End of October  Future visit scheduled.   yes  Notes to clinic.  Missing labs for plavix  and  xanax  not delegated.    Requested Prescriptions  Pending Prescriptions Disp Refills   clopidogrel  (PLAVIX ) 75 MG tablet 90 tablet 0    Sig: Take 1 tablet (75 mg total) by mouth daily.     Hematology: Antiplatelets - clopidogrel  Failed - 06/23/2024 10:12 AM      Failed - HCT in normal range and within 180 days    Hematocrit  Date Value Ref Range Status  02/21/2024 CANCELED      Comment:    Test not performed  Result canceled by the ancillary.          Failed - HGB in normal range and within 180 days    Hemoglobin  Date Value Ref Range Status  02/21/2024 CANCELED      Comment:    Test not performed  Result canceled by the ancillary.          Failed - PLT in normal range and within 180 days    Platelets  Date Value Ref Range Status  02/21/2024 CANCELED      Comment:    Test not performed  Result canceled by the ancillary.          Failed - Cr in normal range and within 360 days    Creatinine  Date Value Ref Range Status  02/23/2013 0.45 (L) 0.60 - 1.30 mg/dL Final   Creatinine, Ser  Date Value Ref Range Status  02/21/2024 CANCELED mg/dL     Comment:    Test not performed. No serum received.  Result canceled by the ancillary.          Passed - Valid encounter within last 6 months    Recent Outpatient Visits           4 months ago Essential hypertension   [place] [facility] at [facility], Leita DEL, MD   10 months ago Annual physical exam   [facility] at [facility], Leita DEL, MD               ALPRAZolam  (XANAX ) 0.5 MG tablet 60 tablet 2    Sig: Take 1 tablet (0.5 mg total) by mouth.     Not Delegated - Psychiatry: Anxiolytics/Hypnotics  2 Failed - 06/23/2024 10:12 AM      Failed - This refill cannot be delegated      Failed - Urine Drug Screen completed in last 360 days      Passed - Patient is not pregnant      Passed - Valid encounter within last 6 months    Recent Outpatient Visits           4 months ago Essential hypertension   [place] [facility] at [facility], Leita DEL, MD   10 months ago Annual physical exam   [facility] Medicine at [facility], Leita DEL, MD              Signed Prescriptions Disp Refills   sertraline  (ZOLOFT ) 100 MG tablet 90 tablet 0    Sig: Take 1 tablet (100 mg total) by mouth daily.     Psychiatry:  Antidepressants - SSRI -  sertraline  Passed - 06/23/2024 10:12 AM      Passed - AST in normal range and within 360 days    AST  Date Value Ref Range Status  02/21/2024 CANCELED IU/L     Comment:    Test not performed. No serum received.  Result canceled by the ancillary.    SGOT(AST)  Date Value Ref Range Status  02/01/2013 38 (H) 15 - 37 Unit/L Final         Passed - ALT in normal range and within 360 days    ALT  Date Value Ref Range Status  02/21/2024 CANCELED IU/L     Comment:    Test not performed. No serum received.  Result canceled by the ancillary.    SGPT (ALT)  Date Value Ref Range Status  02/01/2013 21 12 - 78 U/L Final         Passed - Completed PHQ-2 or PHQ-9 in the last 360 days      Passed - Valid encounter within last 6 months    Recent Outpatient Visits           4 months ago Essential hypertension   [place] [facility] at [facility], Leita DEL, MD   10 months ago Annual physical exam   [place] [facility] at [facility], Leita DEL, MD               traZODone  (DESYREL ) 50 MG tablet 90 tablet 0    Sig: Take 1 tablet (50 mg total) by mouth at bedtime as needed. for sleep     Psychiatry:  Antidepressants - Serotonin Modulator Passed - 06/23/2024 10:12 AM      Passed - Completed PHQ-2 or PHQ-9 in the last 360 days      Passed - Valid encounter within last 6 months    Recent Outpatient Visits           4 months ago Essential hypertension   [place] [facility] at [facility], Leita DEL, MD   10 months ago Annual physical exam   [facility] at [facility], Leita DEL, MD               mirtazapine  (REMERON ) 30 MG tablet 90 tablet 0    Sig: Take 1 tablet (30 mg total) by mouth at bedtime.     Psychiatry: Antidepressants - mirtazapine  Passed - 06/23/2024 10:12 AM      Passed - Completed PHQ-2 or PHQ-9 in the last 360 days      Passed - Valid encounter within last 6 months    Recent Outpatient Visits           4 months ago Essential hypertension   [place] [facility] at [facility], Leita DEL, MD   10 months ago Annual physical exam   [facility] at [facility], Leita DEL, MD

## 2024-08-04 ENCOUNTER — Encounter: Admitting: Student

## 2025-02-24 ENCOUNTER — Ambulatory Visit
